# Patient Record
Sex: Female | Born: 1960 | Race: White | Hispanic: No | Marital: Married | Smoking: Never smoker
Health system: Southern US, Community
[De-identification: ages and names within clinical notes are randomized; demographics above are authoritative.]

## PROBLEM LIST (undated history)

## (undated) DIAGNOSIS — I10 Essential (primary) hypertension: Secondary | ICD-10-CM

## (undated) DIAGNOSIS — G4730 Sleep apnea, unspecified: Secondary | ICD-10-CM

## (undated) DIAGNOSIS — K219 Gastro-esophageal reflux disease without esophagitis: Secondary | ICD-10-CM

## (undated) DIAGNOSIS — R7301 Impaired fasting glucose: Secondary | ICD-10-CM

## (undated) DIAGNOSIS — E119 Type 2 diabetes mellitus without complications: Secondary | ICD-10-CM

## (undated) DIAGNOSIS — E282 Polycystic ovarian syndrome: Secondary | ICD-10-CM

## (undated) HISTORY — DX: Polycystic ovarian syndrome: E28.2

## (undated) HISTORY — DX: Impaired fasting glucose: R73.01

## (undated) HISTORY — DX: Essential (primary) hypertension: I10

---

## 2003-07-09 ENCOUNTER — Ambulatory Visit (HOSPITAL_COMMUNITY): Admission: RE | Admit: 2003-07-09 | Discharge: 2003-07-09 | Payer: Self-pay | Admitting: Family Medicine

## 2004-07-05 ENCOUNTER — Ambulatory Visit (HOSPITAL_COMMUNITY): Admission: RE | Admit: 2004-07-05 | Discharge: 2004-07-05 | Payer: Self-pay | Admitting: Family Medicine

## 2006-04-10 ENCOUNTER — Ambulatory Visit (HOSPITAL_COMMUNITY): Admission: RE | Admit: 2006-04-10 | Discharge: 2006-04-10 | Payer: Self-pay | Admitting: Family Medicine

## 2006-04-21 DIAGNOSIS — R7301 Impaired fasting glucose: Secondary | ICD-10-CM

## 2006-04-21 HISTORY — DX: Impaired fasting glucose: R73.01

## 2007-06-20 ENCOUNTER — Ambulatory Visit (HOSPITAL_COMMUNITY): Admission: RE | Admit: 2007-06-20 | Discharge: 2007-06-20 | Payer: Self-pay | Admitting: Family Medicine

## 2008-10-19 ENCOUNTER — Ambulatory Visit (HOSPITAL_COMMUNITY): Admission: RE | Admit: 2008-10-19 | Discharge: 2008-10-19 | Payer: Self-pay | Admitting: Family Medicine

## 2010-09-21 DIAGNOSIS — I10 Essential (primary) hypertension: Secondary | ICD-10-CM

## 2010-09-21 HISTORY — DX: Essential (primary) hypertension: I10

## 2010-09-30 ENCOUNTER — Other Ambulatory Visit (HOSPITAL_COMMUNITY): Payer: Self-pay | Admitting: Family Medicine

## 2010-09-30 DIAGNOSIS — Z139 Encounter for screening, unspecified: Secondary | ICD-10-CM

## 2010-10-04 ENCOUNTER — Other Ambulatory Visit (HOSPITAL_COMMUNITY): Payer: Self-pay | Admitting: Family Medicine

## 2010-10-04 DIAGNOSIS — R1011 Right upper quadrant pain: Secondary | ICD-10-CM

## 2010-10-07 ENCOUNTER — Ambulatory Visit (HOSPITAL_COMMUNITY)
Admission: RE | Admit: 2010-10-07 | Discharge: 2010-10-07 | Disposition: A | Discharge status: Home / Self Care | Payer: PRIVATE HEALTH INSURANCE | Source: Ambulatory Visit | Attending: Family Medicine | Admitting: Family Medicine

## 2010-10-07 DIAGNOSIS — Z139 Encounter for screening, unspecified: Secondary | ICD-10-CM

## 2010-10-07 DIAGNOSIS — Z1231 Encounter for screening mammogram for malignant neoplasm of breast: Secondary | ICD-10-CM | POA: Insufficient documentation

## 2010-10-10 ENCOUNTER — Other Ambulatory Visit (HOSPITAL_COMMUNITY): Payer: Self-pay

## 2010-10-11 ENCOUNTER — Ambulatory Visit (HOSPITAL_COMMUNITY)
Admission: RE | Admit: 2010-10-11 | Discharge: 2010-10-11 | Disposition: A | Discharge status: Home / Self Care | Payer: PRIVATE HEALTH INSURANCE | Source: Ambulatory Visit | Attending: Family Medicine | Admitting: Family Medicine

## 2010-10-11 ENCOUNTER — Other Ambulatory Visit (HOSPITAL_COMMUNITY): Payer: Self-pay

## 2010-10-11 DIAGNOSIS — R1011 Right upper quadrant pain: Secondary | ICD-10-CM

## 2010-10-11 DIAGNOSIS — R109 Unspecified abdominal pain: Secondary | ICD-10-CM | POA: Insufficient documentation

## 2010-10-11 DIAGNOSIS — K7689 Other specified diseases of liver: Secondary | ICD-10-CM | POA: Insufficient documentation

## 2012-10-21 ENCOUNTER — Other Ambulatory Visit (HOSPITAL_COMMUNITY): Payer: Self-pay | Admitting: Family Medicine

## 2012-10-21 DIAGNOSIS — Z139 Encounter for screening, unspecified: Secondary | ICD-10-CM

## 2012-10-24 ENCOUNTER — Ambulatory Visit (HOSPITAL_COMMUNITY)
Admission: RE | Admit: 2012-10-24 | Discharge: 2012-10-24 | Disposition: A | Discharge status: Home / Self Care | Payer: PRIVATE HEALTH INSURANCE | Source: Ambulatory Visit | Attending: Family Medicine | Admitting: Family Medicine

## 2012-10-24 DIAGNOSIS — Z139 Encounter for screening, unspecified: Secondary | ICD-10-CM

## 2012-10-24 DIAGNOSIS — Z1231 Encounter for screening mammogram for malignant neoplasm of breast: Secondary | ICD-10-CM | POA: Insufficient documentation

## 2012-10-26 ENCOUNTER — Encounter: Payer: Self-pay | Admitting: *Deleted

## 2012-10-29 ENCOUNTER — Other Ambulatory Visit: Payer: Self-pay | Admitting: Family Medicine

## 2012-10-29 DIAGNOSIS — R928 Other abnormal and inconclusive findings on diagnostic imaging of breast: Secondary | ICD-10-CM

## 2012-11-06 ENCOUNTER — Other Ambulatory Visit: Payer: Self-pay | Admitting: Family Medicine

## 2012-11-06 ENCOUNTER — Other Ambulatory Visit (HOSPITAL_COMMUNITY): Payer: Self-pay | Admitting: Family Medicine

## 2012-11-06 ENCOUNTER — Encounter (HOSPITAL_COMMUNITY): Payer: PRIVATE HEALTH INSURANCE

## 2012-11-06 ENCOUNTER — Ambulatory Visit (HOSPITAL_COMMUNITY)
Admission: RE | Admit: 2012-11-06 | Discharge: 2012-11-06 | Disposition: A | Discharge status: Home / Self Care | Payer: PRIVATE HEALTH INSURANCE | Source: Ambulatory Visit | Attending: Family Medicine | Admitting: Family Medicine

## 2012-11-06 DIAGNOSIS — R928 Other abnormal and inconclusive findings on diagnostic imaging of breast: Secondary | ICD-10-CM

## 2012-11-06 DIAGNOSIS — N6001 Solitary cyst of right breast: Secondary | ICD-10-CM

## 2012-11-06 DIAGNOSIS — N6009 Solitary cyst of unspecified breast: Secondary | ICD-10-CM | POA: Insufficient documentation

## 2012-11-11 ENCOUNTER — Other Ambulatory Visit: Payer: Self-pay | Admitting: Family Medicine

## 2012-11-11 ENCOUNTER — Ambulatory Visit
Admission: RE | Admit: 2012-11-11 | Discharge: 2012-11-11 | Disposition: A | Discharge status: Home / Self Care | Payer: PRIVATE HEALTH INSURANCE | Source: Ambulatory Visit | Attending: Family Medicine | Admitting: Family Medicine

## 2012-11-11 DIAGNOSIS — N6001 Solitary cyst of right breast: Secondary | ICD-10-CM

## 2012-12-04 ENCOUNTER — Telehealth: Payer: Self-pay | Admitting: Family Medicine

## 2012-12-04 NOTE — Telephone Encounter (Signed)
[employer] was approved by [employer] until 02/28/13

## 2013-02-12 ENCOUNTER — Other Ambulatory Visit: Payer: Self-pay | Admitting: Family Medicine

## 2013-05-09 ENCOUNTER — Other Ambulatory Visit: Payer: Self-pay | Admitting: Family Medicine

## 2013-05-09 DIAGNOSIS — N6310 Unspecified lump in the right breast, unspecified quadrant: Secondary | ICD-10-CM

## 2013-05-30 ENCOUNTER — Other Ambulatory Visit: Payer: PRIVATE HEALTH INSURANCE

## 2013-06-02 ENCOUNTER — Ambulatory Visit
Admission: RE | Admit: 2013-06-02 | Discharge: 2013-06-02 | Disposition: A | Discharge status: Home / Self Care | Payer: PRIVATE HEALTH INSURANCE | Source: Ambulatory Visit | Attending: Family Medicine | Admitting: Family Medicine

## 2013-06-02 DIAGNOSIS — N6310 Unspecified lump in the right breast, unspecified quadrant: Secondary | ICD-10-CM

## 2013-07-30 ENCOUNTER — Encounter: Payer: Self-pay | Admitting: Nurse Practitioner

## 2013-07-30 ENCOUNTER — Ambulatory Visit (INDEPENDENT_AMBULATORY_CARE_PROVIDER_SITE_OTHER): Payer: PRIVATE HEALTH INSURANCE | Admitting: Nurse Practitioner

## 2013-07-30 VITALS — BP 148/92 | Temp 98.5°F | Ht 68.0 in | Wt 256.4 lb

## 2013-07-30 DIAGNOSIS — G5690 Unspecified mononeuropathy of unspecified upper limb: Secondary | ICD-10-CM

## 2013-07-30 DIAGNOSIS — J310 Chronic rhinitis: Secondary | ICD-10-CM

## 2013-07-30 DIAGNOSIS — M67919 Unspecified disorder of synovium and tendon, unspecified shoulder: Secondary | ICD-10-CM

## 2013-07-30 DIAGNOSIS — M7582 Other shoulder lesions, left shoulder: Secondary | ICD-10-CM

## 2013-07-30 DIAGNOSIS — J329 Chronic sinusitis, unspecified: Secondary | ICD-10-CM

## 2013-07-30 DIAGNOSIS — I10 Essential (primary) hypertension: Secondary | ICD-10-CM

## 2013-07-30 DIAGNOSIS — M778 Other enthesopathies, not elsewhere classified: Secondary | ICD-10-CM

## 2013-07-30 MED ORDER — LOSARTAN POTASSIUM 50 MG PO TABS: ORAL_TABLET | ORAL | Status: DC

## 2013-07-30 MED ORDER — AMOXICILLIN-POT CLAVULANATE 875-125 MG PO TABS: 1.0000 | ORAL_TABLET | Freq: Two times a day (BID) | ORAL | Status: DC

## 2013-07-31 ENCOUNTER — Encounter: Payer: Self-pay | Admitting: Nurse Practitioner

## 2013-07-31 DIAGNOSIS — I10 Essential (primary) hypertension: Secondary | ICD-10-CM | POA: Insufficient documentation

## 2013-07-31 DIAGNOSIS — G5690 Unspecified mononeuropathy of unspecified upper limb: Secondary | ICD-10-CM | POA: Insufficient documentation

## 2013-07-31 NOTE — Assessment & Plan Note (Signed)
OTC meds as directed for congestion. Given prescription for wrist brace, recommend she wear wrist brace on both At nighttime. Anti-inflammatories as directed. Defers referral to hand specialist at this point. Given written and verbal information on shoulder tendinitis. Call back if symptoms worsen or persist. 

## 2013-07-31 NOTE — Assessment & Plan Note (Signed)
OTC meds as directed for congestion. Given prescription for wrist brace, recommend she wear wrist brace on both At nighttime. Anti-inflammatories as directed. Defers referral to hand specialist at this point. Given written and verbal information on shoulder tendinitis. Call back if symptoms worsen or persist.

## 2013-07-31 NOTE — Progress Notes (Signed)
Subjective:  Presents for several issues. Has had sinus symptoms for the past 10 days. Had 3 doses of amoxicillin which she took. No headache. Sore throat has improved. Slight cough. Some ear pain. Sinus pressure. Has had occasional tingling and pain going into the hands and fingers at times. Her job requires working on a Animator. Did some raking around Thanksgiving which caused a flareup of her symptoms. Now more constant. Involves all the fingers but mainly towards the middle fingers more on the left hand. Has worn a wrist brace on her right hand. Occasional feeling of electric shock going down the hands. Sensation of them falling asleep at nighttime. Mild left shoulder pain, no history of injury. No neck pain. No elbow pain. Taking her blood pressure medication. No chest pain/ischemic type pain or shortness of breath. No edema. Is due for lab work, states her job provides it early in the year. Patient to send [place] a copy.  Objective:   BP 148/92  Temp(Src) 98.5 F (36.9 C) (Oral)  Ht 5\' 8"  (1.727 m)  Wt 256 lb 6.4 oz (116.302 kg)  BMI 38.99 kg/m2 NAD. Alert, oriented. TMs clear effusion, no erythema. Pharynx injected with PND noted. Neck supple with mild soft nontender adenopathy. Lungs clear. Heart regular rate rhythm. Lower extremities no edema. Good ROM of the neck without tenderness. Good ROM of right shoulder without tenderness. Tenderness noted around the anterior and mid shoulder joint with pain noted with full rotation of the shoulder above shoulder height. Can perform active ROM with minimal tenderness. Hand and arm strength 5+ bilateral. Sensation grossly intact. Phalen's produces symptoms in both hands, Tinel's negative. Strong radial pulses bilaterally.  Assessment:Essential hypertension, benign  Rhinosinusitis  Neuropathy of hand, unspecified laterality  Shoulder tendinitis, left  Plan: Meds ordered this encounter  Medications  . losartan (COZAAR) 50 MG tablet    Sig: TAKE 1  TABLET BY MOUTH EVERY MORNING    Dispense:  30 tablet    Refill:  5    Order Specific Question:  Supervising Provider    Answer:  Merlyn Albert [[id number]]  . amoxicillin-clavulanate (AUGMENTIN) 875-125 MG per tablet    Sig: Take 1 tablet by mouth 2 (two) times daily.    Dispense:  20 tablet    Refill:  0    Order Specific Question:  Supervising Provider    Answer:  Merlyn Albert [[id number]]   OTC meds as directed for congestion. Given prescription for wrist brace, recommend she wear wrist brace on both At nighttime. Anti-inflammatories as directed. Defers referral to hand specialist at this point. Given written and verbal information on shoulder tendinitis. Call back if symptoms worsen or persist.

## 2013-12-03 ENCOUNTER — Other Ambulatory Visit: Payer: Self-pay | Admitting: Family Medicine

## 2013-12-03 DIAGNOSIS — Z09 Encounter for follow-up examination after completed treatment for conditions other than malignant neoplasm: Secondary | ICD-10-CM

## 2013-12-04 ENCOUNTER — Encounter: Payer: Self-pay | Admitting: Nurse Practitioner

## 2013-12-04 ENCOUNTER — Ambulatory Visit (INDEPENDENT_AMBULATORY_CARE_PROVIDER_SITE_OTHER): Payer: PRIVATE HEALTH INSURANCE | Admitting: Nurse Practitioner

## 2013-12-04 VITALS — BP 138/94 | Ht 68.0 in | Wt 260.0 lb

## 2013-12-04 DIAGNOSIS — N912 Amenorrhea, unspecified: Secondary | ICD-10-CM

## 2013-12-04 DIAGNOSIS — Z23 Encounter for immunization: Secondary | ICD-10-CM

## 2013-12-04 DIAGNOSIS — Z0000 Encounter for general adult medical examination without abnormal findings: Secondary | ICD-10-CM

## 2013-12-04 DIAGNOSIS — E119 Type 2 diabetes mellitus without complications: Secondary | ICD-10-CM

## 2013-12-04 DIAGNOSIS — Z124 Encounter for screening for malignant neoplasm of cervix: Secondary | ICD-10-CM

## 2013-12-04 DIAGNOSIS — Z09 Encounter for follow-up examination after completed treatment for conditions other than malignant neoplasm: Secondary | ICD-10-CM

## 2013-12-04 MED ORDER — METFORMIN HCL 500 MG PO TABS: 500.0000 mg | ORAL_TABLET | Freq: Two times a day (BID) | ORAL | Status: DC

## 2013-12-04 MED ORDER — ALBIGLUTIDE 50 MG ~~LOC~~ PEN: PEN_INJECTOR | SUBCUTANEOUS | Status: DC

## 2013-12-05 ENCOUNTER — Encounter: Payer: Self-pay | Admitting: Nurse Practitioner

## 2013-12-05 DIAGNOSIS — E119 Type 2 diabetes mellitus without complications: Secondary | ICD-10-CM | POA: Insufficient documentation

## 2013-12-05 LAB — PAP IG W/ RFLX HPV ASCU

## 2013-12-05 NOTE — Progress Notes (Signed)
Subjective:    Patient ID: Susan Becker, female    DOB: 1961-04-08, 53 y.o.   MRN: [id number]  HPI Presents for her wellness checkup. Has brought labs from work. Increased stress and weight gain lately. Working extra hours. No exercise. Has mammogram scheduled next week. Has been off metformin. No cycle since November. Married, same partner. No pelvic pain or discharge. Regular dental and vision exams.     Review of Systems  Constitutional: Positive for fatigue. Negative for activity change and appetite change.  HENT: Negative for dental problem, ear pain, sinus pressure and sore throat.   Respiratory: Negative for cough, chest tightness, shortness of breath and wheezing.   Cardiovascular: Negative for chest pain and leg swelling.  Gastrointestinal: Negative for nausea, vomiting, abdominal pain, diarrhea, constipation and blood in stool.  Genitourinary: Negative for dysuria, urgency, frequency, vaginal bleeding, vaginal discharge, enuresis, difficulty urinating, menstrual problem and pelvic pain.       Objective:   Physical Exam  Vitals reviewed. Constitutional: She is oriented to person, place, and time. She appears well-developed. No distress.  HENT:  Right Ear: External ear normal.  Left Ear: External ear normal.  Mouth/Throat: Oropharynx is clear and moist.  Neck: Normal range of motion. Neck supple. No tracheal deviation present. No thyromegaly present.  Cardiovascular: Normal rate, regular rhythm and normal heart sounds.  Exam reveals no gallop.   No murmur heard. Pulmonary/Chest: Effort normal and breath sounds normal.  Abdominal: Soft. She exhibits no distension. There is no tenderness.  Rectal exam: no masses; no stool for hemoccult.   Genitourinary: Vagina normal and uterus normal. No vaginal discharge found.  External GU: no lesions or rash. Vagina no discharge. Cervix normal in appearance. No CMT. Bimanual exam normal but limited due to abd girth.  Musculoskeletal:  She exhibits no edema.  Lymphadenopathy:    She has no cervical adenopathy.  Neurological: She is alert and oriented to person, place, and time.  Skin: Skin is warm and dry. No rash noted.  Psychiatric: She has a normal mood and affect. Her behavior is normal.  Breast exam: no dominant masses; axillae no adenopathy. See diabetic foot exam.       Assessment & Plan:  Routine general medical examination at a health care facility  Type 2 diabetes mellitus new diagnosis - Plan: Microalbumin, urine, Hemoglobin A1c, Microalbumin, urine, Hemoglobin A1c  Amenorrhea - Plan: FSH, LH, Estradiol, Progesterone, Progesterone, FSH, LH, Estradiol  Follow-up exam - Plan: MM Digital Diagnostic Bilat  Screening for cervical cancer - Plan: Pap IG w/ reflex to HPV when ASC-U  Need for prophylactic vaccination with combined diphtheria-tetanus-pertussis (DTP) vaccine - Plan: Tdap vaccine greater than or equal to 7yo IM  Meds ordered this encounter  Medications  . metFORMIN (GLUCOPHAGE) 500 MG tablet    Sig: Take 1 tablet (500 mg total) by mouth 2 (two) times daily with a meal.    Dispense:  60 tablet    Refill:  5    Order Specific Question:  Supervising Provider    Answer:  Mikey Kirschner [[id number]]  . Albiglutide (TANZEUM) 50 MG PEN    Sig: Inject SQ once a week as directed    Dispense:  4 each    Refill:  5    Order Specific Question:  Supervising Provider    Answer:  Mikey Kirschner [[id number]]   Start with Tanzeum 30 mg first dose. Discussed weight loss, regular exercise and stress reduction. Office visit with labs in  3 months.

## 2013-12-10 ENCOUNTER — Other Ambulatory Visit: Payer: Self-pay | Admitting: Family Medicine

## 2013-12-10 ENCOUNTER — Ambulatory Visit (HOSPITAL_COMMUNITY)
Admission: RE | Admit: 2013-12-10 | Discharge: 2013-12-10 | Disposition: A | Discharge status: Home / Self Care | Payer: PRIVATE HEALTH INSURANCE | Source: Ambulatory Visit | Attending: Family Medicine | Admitting: Family Medicine

## 2013-12-10 ENCOUNTER — Encounter (HOSPITAL_COMMUNITY): Payer: PRIVATE HEALTH INSURANCE

## 2013-12-10 DIAGNOSIS — Z09 Encounter for follow-up examination after completed treatment for conditions other than malignant neoplasm: Secondary | ICD-10-CM

## 2013-12-10 DIAGNOSIS — N63 Unspecified lump in unspecified breast: Secondary | ICD-10-CM

## 2013-12-26 ENCOUNTER — Other Ambulatory Visit: Payer: Self-pay

## 2013-12-26 ENCOUNTER — Other Ambulatory Visit: Payer: Self-pay | Admitting: Family Medicine

## 2013-12-26 DIAGNOSIS — N63 Unspecified lump in unspecified breast: Secondary | ICD-10-CM

## 2013-12-30 ENCOUNTER — Other Ambulatory Visit: Payer: Self-pay | Admitting: Family Medicine

## 2013-12-30 ENCOUNTER — Ambulatory Visit
Admission: RE | Admit: 2013-12-30 | Discharge: 2013-12-30 | Disposition: A | Discharge status: Home / Self Care | Payer: PRIVATE HEALTH INSURANCE | Source: Ambulatory Visit | Attending: Family Medicine | Admitting: Family Medicine

## 2013-12-30 DIAGNOSIS — N63 Unspecified lump in unspecified breast: Secondary | ICD-10-CM

## 2014-01-28 ENCOUNTER — Encounter: Payer: Self-pay | Admitting: Nurse Practitioner

## 2014-01-28 ENCOUNTER — Ambulatory Visit (INDEPENDENT_AMBULATORY_CARE_PROVIDER_SITE_OTHER): Payer: PRIVATE HEALTH INSURANCE | Admitting: Nurse Practitioner

## 2014-01-28 VITALS — BP 130/90 | Temp 98.3°F | Ht 68.0 in | Wt 252.0 lb

## 2014-01-28 DIAGNOSIS — K219 Gastro-esophageal reflux disease without esophagitis: Secondary | ICD-10-CM

## 2014-01-28 DIAGNOSIS — J029 Acute pharyngitis, unspecified: Secondary | ICD-10-CM

## 2014-01-28 MED ORDER — CEFDINIR 300 MG PO CAPS: 300.0000 mg | ORAL_CAPSULE | Freq: Two times a day (BID) | ORAL | Status: DC

## 2014-01-28 MED ORDER — RANITIDINE HCL 300 MG PO TABS: 300.0000 mg | ORAL_TABLET | Freq: Every day | ORAL | Status: DC

## 2014-01-29 ENCOUNTER — Encounter: Payer: Self-pay | Admitting: Nurse Practitioner

## 2014-01-29 LAB — STREP A DNA PROBE: GASP: NEGATIVE

## 2014-01-29 NOTE — Progress Notes (Signed)
Subjective:  Presents for c/o sore throat that began yesterday. Raw, burning sensation in throat. No fever. No nausea or vomiting. Rare cough. PND. Has slight blood in mucus x 1 this am. No reflux, nausea, vomiting or abd pain. Can swallow saliva without difficulty. Non smoker. Moderate caffeine intake. No NSAID use. No changes in her diet. Under stress.  Objective:   BP 130/90  Temp(Src) 98.3 F (36.8 C)  Ht 5\' 8"  (1.727 m)  Wt 252 lb (114.306 kg)  BMI 38.33 kg/m2 NAD. Alert, oriented. TMs minimal clear effusion. Pharynx: no erythema. Cloudy PND noted. Neck supple with mild tender anterior adenopathy. Lungs clear. Heart RRR. abd soft, non distended, moderate epigastric area tenderness.  Assessment: Acute pharyngitis - Plan: POCT rapid strep A, Strep A DNA probe  GERD (gastroesophageal reflux disease)/gastritis  Plan:  Meds ordered this encounter  Medications  . ranitidine (ZANTAC) 300 MG tablet    Sig: Take 1 tablet (300 mg total) by mouth at bedtime.    Dispense:  30 tablet    Refill:  2    Order Specific Question:  Supervising Provider    Answer:  Mikey Kirschner [[id number]]  . cefdinir (OMNICEF) 300 MG capsule    Sig: Take 1 capsule (300 mg total) by mouth 2 (two) times daily.    Dispense:  20 capsule    Refill:  0    Order Specific Question:  Supervising Provider    Answer:  Mikey Kirschner [[id number]]   Discussed reflux. Take Zantac until symptoms including abd pain have resolved then prn. Call back if persists. Throat culture pending.

## 2014-02-24 ENCOUNTER — Telehealth: Payer: Self-pay | Admitting: Family Medicine

## 2014-02-24 NOTE — Telephone Encounter (Signed)
Pt has had her cycle, would like you to cancel the part of her lab orders that you stated she wouldn't need if she had a cycle before getting labs done, please call pt when done so she can go get her labs done before her appt here with you 03/06/14

## 2014-02-25 NOTE — Telephone Encounter (Signed)
Hormone labs cancelled. Patient notified.

## 2014-02-25 NOTE — Telephone Encounter (Signed)
Nurses please cancel: estrogen, FSH, LH and progesterone. She will still need other tests for diabetes. Thanks.

## 2014-03-02 LAB — HEMOGLOBIN A1C
Hgb A1c MFr Bld: 5.9 % — ABNORMAL HIGH (ref <5.7)
Mean Plasma Glucose: 123 mg/dL — ABNORMAL HIGH (ref <117)

## 2014-03-03 LAB — MICROALBUMIN, URINE: Microalb, Ur: 1.24 mg/dL (ref 0.00–1.89)

## 2014-03-06 ENCOUNTER — Encounter: Payer: Self-pay | Admitting: Nurse Practitioner

## 2014-03-06 ENCOUNTER — Ambulatory Visit (INDEPENDENT_AMBULATORY_CARE_PROVIDER_SITE_OTHER): Payer: PRIVATE HEALTH INSURANCE | Admitting: Nurse Practitioner

## 2014-03-06 VITALS — BP 142/92 | Ht 68.0 in | Wt 249.1 lb

## 2014-03-06 DIAGNOSIS — E119 Type 2 diabetes mellitus without complications: Secondary | ICD-10-CM

## 2014-03-06 MED ORDER — PHENTERMINE HCL 37.5 MG PO TABS: 37.5000 mg | ORAL_TABLET | Freq: Every day | ORAL | Status: DC

## 2014-03-07 ENCOUNTER — Encounter: Payer: Self-pay | Admitting: Nurse Practitioner

## 2014-03-07 NOTE — Progress Notes (Signed)
Subjective:  Presents for routine followup. Limited activity was doing well with her diet. No chest pain/ischemic type pain or shortness of breath. Would like to restart phentermine, has taken it in the past without difficulty.  Objective:   BP 142/92  Ht 5\' 8"  (1.727 m)  Wt 249 lb 2 oz (113.002 kg)  BMI 37.89 kg/m2 NAD. Alert, oriented. Lungs clear. Heart regular rate rhythm. See diabetic foot exam. 03/03/2014 hemoglobin A1c 5.9.  Assessment:  Problem List Items Addressed This Visit     Endocrine   Type 2 diabetes mellitus - Primary    Other Visit Diagnoses   Morbid obesity        Relevant Medications       phentermine (ADIPEX-P) 37.5 MG tablet      Encouraged continued weight loss. Goals set for 12 pounds of weight loss over the next 3 months and 20 minutes of activity 3 times per week. Check BP outside the office and if 10 points higher than current results, patient to stop phentermine. Return in about 3 months (around 06/06/2014).

## 2014-03-25 ENCOUNTER — Other Ambulatory Visit: Payer: Self-pay | Admitting: Nurse Practitioner

## 2014-04-28 ENCOUNTER — Encounter: Payer: Self-pay | Admitting: Nurse Practitioner

## 2014-04-28 ENCOUNTER — Ambulatory Visit (INDEPENDENT_AMBULATORY_CARE_PROVIDER_SITE_OTHER): Payer: PRIVATE HEALTH INSURANCE | Admitting: Nurse Practitioner

## 2014-04-28 VITALS — BP 142/90 | Ht 68.0 in | Wt 243.0 lb

## 2014-04-28 DIAGNOSIS — J0100 Acute maxillary sinusitis, unspecified: Secondary | ICD-10-CM

## 2014-04-28 DIAGNOSIS — M7711 Lateral epicondylitis, right elbow: Secondary | ICD-10-CM

## 2014-04-28 DIAGNOSIS — M7710 Lateral epicondylitis, unspecified elbow: Secondary | ICD-10-CM

## 2014-04-28 MED ORDER — AMOXICILLIN-POT CLAVULANATE 875-125 MG PO TABS: 1.0000 | ORAL_TABLET | Freq: Two times a day (BID) | ORAL | Status: DC

## 2014-04-28 MED ORDER — METHYLPREDNISOLONE ACETATE 40 MG/ML IJ SUSP
40.0000 mg | Freq: Once | INTRAMUSCULAR | Status: AC
  Administered 2014-04-28: 40 mg via INTRAMUSCULAR

## 2014-04-28 MED ORDER — FLUTICASONE PROPIONATE 50 MCG/ACT NA SUSP: 2.0000 | Freq: Every day | NASAL | Status: DC

## 2014-04-28 MED ORDER — FLUCONAZOLE 150 MG PO TABS: ORAL_TABLET | ORAL | Status: DC

## 2014-04-28 NOTE — Patient Instructions (Signed)

## 2014-04-29 ENCOUNTER — Encounter: Payer: Self-pay | Admitting: Nurse Practitioner

## 2014-04-29 NOTE — Progress Notes (Signed)
Subjective:  Presents complaints of sinus symptoms over the past 2-1/2-3 weeks. No fever. Maxillary area sinus headache radiating into the teeth at times. Left ear pain. No sore throat. No cough or wheezing. Also complaints of pain at the lateral epicondyle that is been there off and on for several months. No specific history of injury. Does a lot of computer work. Worse with certain movements. No numbness or weakness.  Objective:   BP 142/90  Ht 5\' 8"  (1.727 m)  Wt 243 lb (110.224 kg)  BMI 36.96 kg/m2 NAD. Alert, oriented. TMs clear effusion, no erythema. Pharynx mildly injected. Neck supple with mild soft anterior adenopathy. Lungs clear. Heart regular rate rhythm. Distinct tenderness noted at the right lateral epicondyle. Hand strength 5+ bilateral. Sensation grossly intact.  Assessment: Acute maxillary sinusitis, recurrence not specified - Plan: methylPREDNISolone acetate (DEPO-MEDROL) injection 40 mg  Lateral epicondylitis, right  Plan:  Meds ordered this encounter  Medications  . fluticasone (FLONASE) 50 MCG/ACT nasal spray    Sig: Place 2 sprays into both nostrils daily.    Dispense:  16 g    Refill:  5    Order Specific Question:  Supervising Provider    Answer:  Mikey Kirschner [[id number]]  . amoxicillin-clavulanate (AUGMENTIN) 875-125 MG per tablet    Sig: Take 1 tablet by mouth 2 (two) times daily.    Dispense:  20 tablet    Refill:  0    Order Specific Question:  Supervising Provider    Answer:  Mikey Kirschner [[id number]]  . fluconazole (DIFLUCAN) 150 MG tablet    Sig: One po qd prn yeast infection; may repeat in 3-4 days if needed    Dispense:  2 tablet    Refill:  0    Order Specific Question:  Supervising Provider    Answer:  Mikey Kirschner [[id number]]  . methylPREDNISolone acetate (DEPO-MEDROL) injection 40 mg    Sig:    OTC antihistamines as directed. OTC anti-inflammatories as directed. Given written and verbal information on lateral epicondylitis, recommend arm band.  Call back if any symptoms worsen or persist.

## 2014-05-27 ENCOUNTER — Ambulatory Visit (INDEPENDENT_AMBULATORY_CARE_PROVIDER_SITE_OTHER): Payer: PRIVATE HEALTH INSURANCE | Admitting: Nurse Practitioner

## 2014-05-27 ENCOUNTER — Encounter: Payer: Self-pay | Admitting: Nurse Practitioner

## 2014-05-27 VITALS — BP 134/90 | Ht 66.0 in | Wt 244.0 lb

## 2014-05-27 DIAGNOSIS — J300 Vasomotor rhinitis: Secondary | ICD-10-CM

## 2014-05-27 DIAGNOSIS — M6248 Contracture of muscle, other site: Secondary | ICD-10-CM

## 2014-05-27 DIAGNOSIS — M62838 Other muscle spasm: Secondary | ICD-10-CM

## 2014-05-27 MED ORDER — CHLORZOXAZONE 500 MG PO TABS: ORAL_TABLET | ORAL | Status: DC

## 2014-05-27 NOTE — Patient Instructions (Signed)
Ice or Education officer, museum Relief TENS unit Massage therapy

## 2014-05-28 ENCOUNTER — Other Ambulatory Visit: Payer: Self-pay | Admitting: Nurse Practitioner

## 2014-05-31 ENCOUNTER — Encounter: Payer: Self-pay | Admitting: Nurse Practitioner

## 2014-05-31 NOTE — Progress Notes (Signed)
Subjective:  Presents complaints of pain in the right mid shoulder radiating into the neck area for the past 2 weeks. Seems to radiate in the right side of the neck into the right ear. Right lateral elbow pain much improved from previous visit. No numbness or weakness of the arms. No specific history of injury. Also continues to have some mild head congestion and ear pressure. No fever. Minimal cough. Clear drainage. Treated for maxillary sinusitis on 9/8.  Objective:   BP 134/90  Ht 5\' 6"  (1.676 m)  Wt 244 lb (110.678 kg)  BMI 39.40 kg/m2 NAD. Alert, oriented. TMs mild clear effusion, no erythema. Pharynx clear. Neck supple with mild soft anterior adenopathy. Lungs clear. Heart regular rate rhythm. Distinct tight tender muscles noted along the trapezius into the cervical and right lateral neck area. Radiates into the upper rhomboids. Tenderness near the a.c. joint right shoulder. Can perform active and passive ROM of the right shoulder with tenderness produced in the trapezius. Hand and arm strength 5+ bilateral. Sensation grossly intact.  Assessment: Muscle spasms of head and/or neck  Vasomotor rhinitis  Plan:  Meds ordered this encounter  Medications  . Meloxicam (MOBIC PO)    Sig: Take by mouth.  . chlorzoxazone (PARAFON) 500 MG tablet    Sig: One po qhs prn muscle spasms    Dispense:  30 tablet    Refill:  0    Order Specific Question:  Supervising Provider    Answer:  Mikey Kirschner [[id number]]   ice or heat applications. OTC TENS unit. Massage therapy. Call back in 2 weeks if symptoms persist, sooner if worse. OTC meds as directed for head congestion.

## 2014-06-03 ENCOUNTER — Encounter: Payer: Self-pay | Admitting: Nurse Practitioner

## 2014-06-03 ENCOUNTER — Ambulatory Visit (INDEPENDENT_AMBULATORY_CARE_PROVIDER_SITE_OTHER): Payer: PRIVATE HEALTH INSURANCE | Admitting: Nurse Practitioner

## 2014-06-03 VITALS — BP 130/82 | Ht 66.0 in | Wt 245.0 lb

## 2014-06-03 DIAGNOSIS — I10 Essential (primary) hypertension: Secondary | ICD-10-CM

## 2014-06-03 DIAGNOSIS — E119 Type 2 diabetes mellitus without complications: Secondary | ICD-10-CM | POA: Diagnosis not present

## 2014-06-03 MED ORDER — PHENTERMINE HCL 37.5 MG PO TABS: 37.5000 mg | ORAL_TABLET | Freq: Every day | ORAL | Status: DC

## 2014-06-03 NOTE — Progress Notes (Addendum)
Subjective:  Presents for follow up. Doing well with activity; 20-30 minutes 3-7 days of the week. Limits diet soda to 2 per day; has increased water intake. Problem is with her diet; busy with family and work; eats "on the run".   Objective:   BP 130/82  Ht 5\' 6"  (1.676 m)  Wt 245 lb (111.131 kg)  BMI 39.56 kg/m2 NAD. Alert, oriented. Lungs clear. Heart RRR.  Assessment:  Problem List Items Addressed This Visit      Cardiovascular and Mediastinum   Essential hypertension, benign - Primary     Endocrine   Type 2 diabetes mellitus     Other   Morbid obesity      Plan:  Meds ordered this encounter  Medications  . phentermine (ADIPEX-P) 37.5 MG tablet    Sig: Take 1 tablet (37.5 mg total) by mouth daily before breakfast.    Dispense:  30 tablet    Refill:  2    Order Specific Question:  Supervising Provider    Answer:  Mikey Kirschner [[id number]]   Continue activity. Lengthy discussion about diet. Limit junk food. Recommend 3 meals per day plus 2 healthy snacks. Return in about 4 months (around 10/04/2014). Labs at that time. Receives flu vaccine at work.

## 2014-08-27 ENCOUNTER — Telehealth: Payer: Self-pay | Admitting: Nurse Practitioner

## 2014-08-27 NOTE — Telephone Encounter (Signed)
Patient notified and verbalized understanding. Will call back if she has any problems.

## 2014-08-27 NOTE — Telephone Encounter (Signed)
Just spoke with insurance. Will change diagnosis and send through again. Let us know if further problems.

## 2014-08-27 NOTE — Telephone Encounter (Signed)
Sorry this happened. Usually morbid obesity is a secondary diagnosis. I will talk to our insurance people to see if we can change.

## 2014-08-27 NOTE — Telephone Encounter (Signed)
Patient called in concern with her bill that she received for DOS:06/03/14.  She was diagnosed with morbid obesity.  She received her phentermine this day.  Her insurance is not wanting to cover the primary diagnosis of "morbid obesity".  In the past they have covered her diabetes as a primary diagnosis though.  She is wanting to know if Hoyle Sauer can go in and make an addendum to this so that her insurance will pay this?  Also, she wants to make sure that future visits for her phentermine will not be diagnosed for "morbid obesity" because if so, then she will stop taking it.  Please advise.

## 2014-09-25 ENCOUNTER — Other Ambulatory Visit: Payer: Self-pay | Admitting: Nurse Practitioner

## 2014-10-06 ENCOUNTER — Telehealth: Payer: Self-pay | Admitting: Nurse Practitioner

## 2014-10-06 NOTE — Telephone Encounter (Signed)
Pt is wanting to know if she needs any bw orders for her up coming appt   Please call when sent

## 2014-10-07 ENCOUNTER — Other Ambulatory Visit: Payer: Self-pay | Admitting: Nurse Practitioner

## 2014-10-07 DIAGNOSIS — R5383 Other fatigue: Secondary | ICD-10-CM

## 2014-10-07 DIAGNOSIS — I10 Essential (primary) hypertension: Secondary | ICD-10-CM

## 2014-10-07 DIAGNOSIS — Z13220 Encounter for screening for lipoid disorders: Secondary | ICD-10-CM

## 2014-10-07 DIAGNOSIS — E119 Type 2 diabetes mellitus without complications: Secondary | ICD-10-CM

## 2014-10-07 NOTE — Telephone Encounter (Signed)
Done. Ordered through [place] (I think!); also I don't know if she is having any labs through work. If she is, all we will need is HgbA1C here in the office.

## 2014-10-08 NOTE — Telephone Encounter (Signed)
Pt notified that we would like for her to get her BW done before her appt tomorrow. Her work did not send her for BW.

## 2014-10-09 ENCOUNTER — Encounter: Payer: Self-pay | Admitting: Nurse Practitioner

## 2014-10-09 ENCOUNTER — Ambulatory Visit (INDEPENDENT_AMBULATORY_CARE_PROVIDER_SITE_OTHER): Payer: PRIVATE HEALTH INSURANCE | Admitting: Nurse Practitioner

## 2014-10-09 VITALS — BP 152/94 | Ht 66.0 in | Wt 243.0 lb

## 2014-10-09 DIAGNOSIS — E119 Type 2 diabetes mellitus without complications: Secondary | ICD-10-CM

## 2014-10-09 DIAGNOSIS — F419 Anxiety disorder, unspecified: Secondary | ICD-10-CM

## 2014-10-09 DIAGNOSIS — F430 Acute stress reaction: Secondary | ICD-10-CM

## 2014-10-09 DIAGNOSIS — I10 Essential (primary) hypertension: Secondary | ICD-10-CM

## 2014-10-09 DIAGNOSIS — F411 Generalized anxiety disorder: Secondary | ICD-10-CM

## 2014-10-09 MED ORDER — LOSARTAN POTASSIUM 50 MG PO TABS: 50.0000 mg | ORAL_TABLET | Freq: Every morning | ORAL | Status: DC

## 2014-10-09 MED ORDER — METFORMIN HCL 500 MG PO TABS: ORAL_TABLET | ORAL | Status: DC

## 2014-10-09 MED ORDER — ESCITALOPRAM OXALATE 10 MG PO TABS: 10.0000 mg | ORAL_TABLET | Freq: Every day | ORAL | Status: DC

## 2014-10-10 LAB — BASIC METABOLIC PANEL
BUN: 11 mg/dL (ref 6–23)
CALCIUM: 9.6 mg/dL (ref 8.4–10.5)
CO2: 28 meq/L (ref 19–32)
Chloride: 101 mEq/L (ref 96–112)
Creat: 0.57 mg/dL (ref 0.50–1.10)
Glucose, Bld: 97 mg/dL (ref 70–99)
POTASSIUM: 3.8 meq/L (ref 3.5–5.3)
Sodium: 137 mEq/L (ref 135–145)

## 2014-10-10 LAB — HEMOGLOBIN A1C
Hgb A1c MFr Bld: 6.2 % — ABNORMAL HIGH (ref <5.7)
Mean Plasma Glucose: 131 mg/dL — ABNORMAL HIGH (ref <117)

## 2014-10-10 LAB — HEPATIC FUNCTION PANEL
ALBUMIN: 4.3 g/dL (ref 3.5–5.2)
ALT: 16 U/L (ref 0–35)
AST: 16 U/L (ref 0–37)
Alkaline Phosphatase: 75 U/L (ref 39–117)
Bilirubin, Direct: 0.1 mg/dL (ref 0.0–0.3)
Indirect Bilirubin: 0.3 mg/dL (ref 0.2–1.2)
Total Bilirubin: 0.4 mg/dL (ref 0.2–1.2)
Total Protein: 7.7 g/dL (ref 6.0–8.3)

## 2014-10-10 LAB — LIPID PANEL
CHOL/HDL RATIO: 3.9 ratio
CHOLESTEROL: 207 mg/dL — AB (ref 0–200)
HDL: 53 mg/dL (ref >39)
LDL CALC: 120 mg/dL — AB (ref 0–99)
TRIGLYCERIDES: 171 mg/dL — AB (ref <150)
VLDL: 34 mg/dL (ref 0–40)

## 2014-10-10 LAB — VITAMIN D 25 HYDROXY (VIT D DEFICIENCY, FRACTURES): VIT D 25 HYDROXY: 19 ng/mL — AB (ref 30–100)

## 2014-10-13 ENCOUNTER — Encounter: Payer: Self-pay | Admitting: Nurse Practitioner

## 2014-10-13 ENCOUNTER — Other Ambulatory Visit: Payer: Self-pay | Admitting: Nurse Practitioner

## 2014-10-13 DIAGNOSIS — E559 Vitamin D deficiency, unspecified: Secondary | ICD-10-CM | POA: Insufficient documentation

## 2014-10-13 MED ORDER — VITAMIN D (ERGOCALCIFEROL) 1.25 MG (50000 UNIT) PO CAPS: 50000.0000 [IU] | ORAL_CAPSULE | ORAL | Status: DC

## 2014-10-13 NOTE — Progress Notes (Signed)
Subjective:   Presents for routine follow-up. Has not done well with her diet. Limited exercise. Has been under tremendous stress due to her job an other family issues.  No chest pain/schemic type pain or shortness of breath. Had her lab work done this [employer]. Using tanzeum without difficulty.  Objective:   BP 152/94 mmHg  Ht 5\' 6"  (1.676 m)  Wt 243 lb (110.224 kg)  BMI 39.24 kg/m2  NAD.  Alert, oriented. Lungs clear. Heart regular rate rhythm. Weight stable.  Assessment:  Problem List Items Addressed This Visit      Cardiovascular and Mediastinum   Essential hypertension, benign - Primary   Relevant Medications   losartan (COZAAR) tablet     Endocrine   Type 2 diabetes mellitus   Relevant Medications   losartan (COZAAR) tablet   metFORMIN (GLUCOPHAGE) tablet     Other   Morbid obesity   Relevant Medications   metFORMIN (GLUCOPHAGE) tablet    Other Visit Diagnoses    Anxiety as acute reaction to exceptional stress        Relevant Medications    escitalopram (LEXAPRO) tablet      Plan:  Meds ordered this encounter  Medications  . losartan (COZAAR) 50 MG tablet    Sig: Take 1 tablet (50 mg total) by mouth every morning.    Dispense:  30 tablet    Refill:  5    Generic For:*COZAAR    50MG     Order Specific Question:  Supervising Provider    Answer:  Mikey Kirschner [[id number]]  . metFORMIN (GLUCOPHAGE) 500 MG tablet    Sig: TAKE 1 TABLET BY MOUTH 2 TIMES DAILY WITH A MEAL.    Dispense:  60 tablet    Refill:  5    Generic [id number]  500MG   09/25/2014 7:31:37 AM    Order Specific Question:  Supervising Provider    Answer:  Mikey Kirschner [[id number]]  . escitalopram (LEXAPRO) 10 MG tablet    Sig: Take 1 tablet (10 mg total) by mouth daily.    Dispense:  30 tablet    Refill:  0    Order Specific Question:  Supervising Provider    Answer:  Mikey Kirschner [[id number]]   Recommend regular exercise, stress reduction and healthy diet.  Return in about 1 month (around  11/07/2014). Call back sooner if any problems with Lexapro.

## 2014-10-14 ENCOUNTER — Other Ambulatory Visit: Payer: Self-pay | Admitting: Nurse Practitioner

## 2014-10-14 ENCOUNTER — Encounter (INDEPENDENT_AMBULATORY_CARE_PROVIDER_SITE_OTHER): Payer: Self-pay | Admitting: *Deleted

## 2014-10-14 ENCOUNTER — Other Ambulatory Visit (INDEPENDENT_AMBULATORY_CARE_PROVIDER_SITE_OTHER): Payer: Self-pay | Admitting: *Deleted

## 2014-10-14 DIAGNOSIS — Z800 Family history of malignant neoplasm of digestive organs: Secondary | ICD-10-CM

## 2014-10-14 DIAGNOSIS — Z1211 Encounter for screening for malignant neoplasm of colon: Secondary | ICD-10-CM

## 2014-10-19 ENCOUNTER — Telehealth (INDEPENDENT_AMBULATORY_CARE_PROVIDER_SITE_OTHER): Payer: Self-pay | Admitting: *Deleted

## 2014-10-19 DIAGNOSIS — Z1211 Encounter for screening for malignant neoplasm of colon: Secondary | ICD-10-CM

## 2014-10-19 NOTE — Telephone Encounter (Signed)
Patient needs movi prep 

## 2014-10-20 MED ORDER — PEG-KCL-NACL-NASULF-NA ASC-C 100 G PO SOLR: 1.0000 | Freq: Once | ORAL | Status: DC

## 2014-11-06 ENCOUNTER — Encounter: Payer: Self-pay | Admitting: Nurse Practitioner

## 2014-11-06 ENCOUNTER — Ambulatory Visit (INDEPENDENT_AMBULATORY_CARE_PROVIDER_SITE_OTHER): Payer: PRIVATE HEALTH INSURANCE | Admitting: Nurse Practitioner

## 2014-11-06 VITALS — BP 140/92 | Ht 66.0 in | Wt 243.0 lb

## 2014-11-06 DIAGNOSIS — F419 Anxiety disorder, unspecified: Secondary | ICD-10-CM | POA: Diagnosis not present

## 2014-11-06 DIAGNOSIS — F430 Acute stress reaction: Principal | ICD-10-CM

## 2014-11-06 DIAGNOSIS — F411 Generalized anxiety disorder: Secondary | ICD-10-CM

## 2014-11-06 MED ORDER — ESCITALOPRAM OXALATE 10 MG PO TABS: 10.0000 mg | ORAL_TABLET | Freq: Every day | ORAL | Status: DC

## 2014-11-07 ENCOUNTER — Encounter: Payer: Self-pay | Admitting: Nurse Practitioner

## 2014-11-07 DIAGNOSIS — F411 Generalized anxiety disorder: Secondary | ICD-10-CM | POA: Insufficient documentation

## 2014-11-07 DIAGNOSIS — F430 Acute stress reaction: Principal | ICD-10-CM

## 2014-11-07 DIAGNOSIS — F419 Anxiety disorder, unspecified: Secondary | ICD-10-CM | POA: Insufficient documentation

## 2014-11-07 NOTE — Progress Notes (Signed)
Subjective:  Presents for recheck. Doing better on Lexapro. Sleeping well. Has begun regular exercise program x 3 weeks.   Objective:   BP 140/92 mmHg  Ht 5\' 6"  (1.676 m)  Wt 243 lb (110.224 kg)  BMI 39.24 kg/m2 NAD. Alert, oriented. Cheerful affect. Lungs clear. Heart RRR.   Assessment:  Problem List Items Addressed This Visit      Other   Anxiety as acute reaction to exceptional stress - Primary   Relevant Medications   escitalopram (LEXAPRO) tablet       Plan:  Meds ordered this encounter  Medications  . escitalopram (LEXAPRO) 10 MG tablet    Sig: Take 1 tablet (10 mg total) by mouth daily.    Dispense:  30 tablet    Refill:  5    Order Specific Question:  Supervising Provider    Answer:  Mikey Kirschner [[id number]]   Continue exercise and weight loss efforts.  Return in about 6 months (around 05/09/2015). Call back sooner if problems.

## 2014-11-16 ENCOUNTER — Telehealth (INDEPENDENT_AMBULATORY_CARE_PROVIDER_SITE_OTHER): Payer: Self-pay | Admitting: *Deleted

## 2014-11-16 NOTE — Telephone Encounter (Signed)
agree

## 2014-11-16 NOTE — Telephone Encounter (Signed)
Referring MD/PCP: scott luking   Procedure: tcs  Reason/Indication:  Screening, fam hx colon ca  Has patient had this procedure before?  no  If so, when, by whom and where?    Is there a family history of colon cancer?  Yes, paternal grandmother  Who?  What age when diagnosed?    Is patient diabetic?   yes      Does patient have prosthetic heart valve?  no  Do you have a pacemaker?  no  Has patient ever had endocarditis? no  Has patient had joint replacement within last 12 months?  no  Does patient tend to be constipated or take laxatives? no  Is patient on Coumadin, Plavix and/or Aspirin? yes  Medications: asa 81 mg daily, vit d 50,000 units 1 tab weekly, losartan 50 mg daily, metformin 500 mg bid, escitalopram 10 mg daily, tanzeum 50 mg 1 injection weekly, nasal spray prn, zyrtec prn, parafon prn  Allergies: nkda  Medication Adjustment: asa 2 days, hold metformin evening before & morning of  Procedure date & time: 12/16/14 at 830

## 2014-12-01 ENCOUNTER — Encounter: Payer: Self-pay | Admitting: Nurse Practitioner

## 2014-12-16 ENCOUNTER — Ambulatory Visit (HOSPITAL_COMMUNITY)
Admission: RE | Admit: 2014-12-16 | Discharge: 2014-12-16 | Disposition: A | Discharge status: Home / Self Care | Payer: PRIVATE HEALTH INSURANCE | Source: Ambulatory Visit | Attending: Internal Medicine | Admitting: Internal Medicine

## 2014-12-16 ENCOUNTER — Encounter (HOSPITAL_COMMUNITY)
Admission: RE | Disposition: A | Discharge status: Home / Self Care | Payer: Self-pay | Source: Ambulatory Visit | Attending: Internal Medicine

## 2014-12-16 ENCOUNTER — Encounter (HOSPITAL_COMMUNITY): Payer: Self-pay | Admitting: *Deleted

## 2014-12-16 DIAGNOSIS — I10 Essential (primary) hypertension: Secondary | ICD-10-CM | POA: Diagnosis not present

## 2014-12-16 DIAGNOSIS — K644 Residual hemorrhoidal skin tags: Secondary | ICD-10-CM | POA: Diagnosis not present

## 2014-12-16 DIAGNOSIS — K5730 Diverticulosis of large intestine without perforation or abscess without bleeding: Secondary | ICD-10-CM | POA: Diagnosis not present

## 2014-12-16 DIAGNOSIS — E282 Polycystic ovarian syndrome: Secondary | ICD-10-CM | POA: Diagnosis not present

## 2014-12-16 DIAGNOSIS — Z7951 Long term (current) use of inhaled steroids: Secondary | ICD-10-CM | POA: Insufficient documentation

## 2014-12-16 DIAGNOSIS — Z800 Family history of malignant neoplasm of digestive organs: Secondary | ICD-10-CM | POA: Insufficient documentation

## 2014-12-16 DIAGNOSIS — D125 Benign neoplasm of sigmoid colon: Secondary | ICD-10-CM | POA: Insufficient documentation

## 2014-12-16 DIAGNOSIS — Z7982 Long term (current) use of aspirin: Secondary | ICD-10-CM | POA: Diagnosis not present

## 2014-12-16 DIAGNOSIS — Z79899 Other long term (current) drug therapy: Secondary | ICD-10-CM | POA: Insufficient documentation

## 2014-12-16 DIAGNOSIS — K6289 Other specified diseases of anus and rectum: Secondary | ICD-10-CM | POA: Insufficient documentation

## 2014-12-16 DIAGNOSIS — K648 Other hemorrhoids: Secondary | ICD-10-CM | POA: Diagnosis not present

## 2014-12-16 DIAGNOSIS — Z1211 Encounter for screening for malignant neoplasm of colon: Secondary | ICD-10-CM | POA: Diagnosis not present

## 2014-12-16 HISTORY — PX: COLONOSCOPY: SHX5424

## 2014-12-16 LAB — GLUCOSE, CAPILLARY: GLUCOSE-CAPILLARY: 105 mg/dL — AB (ref 70–99)

## 2014-12-16 SURGERY — COLONOSCOPY
Anesthesia: Moderate Sedation

## 2014-12-16 MED ORDER — STERILE WATER FOR IRRIGATION IR SOLN
Status: DC | PRN
  Administered 2014-12-16: 09:00:00

## 2014-12-16 MED ORDER — SODIUM CHLORIDE 0.9 % IV SOLN
INTRAVENOUS | Status: DC
  Administered 2014-12-16: 1000 mL via INTRAVENOUS

## 2014-12-16 MED ORDER — MEPERIDINE HCL 50 MG/ML IJ SOLN
INTRAMUSCULAR | Status: DC | PRN
  Administered 2014-12-16 (×2): 25 mg via INTRAVENOUS

## 2014-12-16 MED ORDER — MIDAZOLAM HCL 5 MG/5ML IJ SOLN
INTRAMUSCULAR | Status: DC | PRN
  Administered 2014-12-16 (×5): 2 mg via INTRAVENOUS

## 2014-12-16 MED ORDER — MIDAZOLAM HCL 5 MG/5ML IJ SOLN
INTRAMUSCULAR | Status: AC
  Filled 2014-12-16: qty 10

## 2014-12-16 MED ORDER — MEPERIDINE HCL 50 MG/ML IJ SOLN
INTRAMUSCULAR | Status: AC
  Filled 2014-12-16: qty 1

## 2014-12-16 NOTE — H&P (Signed)
Susan Becker is an 54 y.o. female.   Chief Complaint: Patient is here for colonoscopy. HPI: Patient is 54 year old Caucasian female who is in for screening colonoscopy. She denies abdominal pain change in bowel habits or rectal bleeding. This is patient's first exam. Family history is positive for CRC in maternal grandmother who was in her 16s of the time of diagnosis.  Past Medical History  Diagnosis Date  . PCOS (polycystic ovarian syndrome)     per gyn  . Impaired fasting glucose 04/2006  . HTN (hypertension) 09/2010    History reviewed. No pertinent past surgical history.  Family History  Problem Relation Age of Onset  . Hypertension Mother   . Diabetes Mother   . Hypertension Father   . Hyperlipidemia Father   . Cancer Paternal Grandmother 95    colon   Social History:  reports that she has never smoked. She does not have any smokeless tobacco history on file. Her alcohol and drug histories are not on file.  Allergies:  Allergies  Allergen Reactions  . Zithromax [Azithromycin]     Doesn't work    Medications Prior to Admission  Medication Sig Dispense Refill  . aspirin 81 MG tablet Take 81 mg by mouth daily.    Marland Kitchen escitalopram (LEXAPRO) 10 MG tablet Take 1 tablet (10 mg total) by mouth daily. 30 tablet 5  . losartan (COZAAR) 50 MG tablet Take 1 tablet (50 mg total) by mouth every morning. 30 tablet 5  . metFORMIN (GLUCOPHAGE) 500 MG tablet TAKE 1 TABLET BY MOUTH 2 TIMES DAILY WITH A MEAL. 60 tablet 5  . peg 3350 powder (MOVIPREP) 100 G SOLR Take 1 kit (200 g total) by mouth once. 1 kit 0  . TANZEUM 50 MG PEN INJECT SUBCUTANEOUSLY ONCE A WEEK AS DIRECTED 4 each 5  . Vitamin D, Ergocalciferol, (DRISDOL) 50000 UNITS CAPS capsule Take 1 capsule (50,000 Units total) by mouth every 7 (seven) days. 4 capsule 1  . chlorzoxazone (PARAFON) 500 MG tablet One po qhs prn muscle spasms (Patient not taking: Reported on 12/01/2014) 30 tablet 0  . fluticasone (FLONASE) 50 MCG/ACT nasal  spray Place 2 sprays into both nostrils daily. 16 g 5  . ranitidine (ZANTAC) 300 MG tablet Take 1 tablet (300 mg total) by mouth at bedtime. 30 tablet 2    Results for orders placed or performed during the hospital encounter of 12/16/14 (from the past 48 hour(s))  Glucose, capillary     Status: Abnormal   Collection Time: 12/16/14  7:47 AM  Result Value Ref Range   Glucose-Capillary 105 (H) 70 - 99 mg/dL   No results found.  ROS  Blood pressure 183/86, pulse 81, temperature 97.9 F (36.6 C), temperature source Oral, resp. rate 23, height $RemoveBe'5\' 7"'[id number]$  (1.702 m), weight 240 lb (108.863 kg), SpO2 97 %. Physical Exam  Constitutional: She appears well-developed and well-nourished.  HENT:  Mouth/Throat: Oropharynx is clear and moist.  Eyes: Conjunctivae are normal. No scleral icterus.  Neck: No thyromegaly present.  Cardiovascular: Normal rate, regular rhythm and normal heart sounds.   No murmur heard. Respiratory: Effort normal.  GI: Soft. She exhibits no distension and no mass. There is no tenderness.  Musculoskeletal: She exhibits no edema.  Lymphadenopathy:    She has no cervical adenopathy.  Neurological: She is alert.  Skin: Skin is warm and dry.     Assessment/Plan Average risk screening colonoscopy. Family history of CRC in 1 second-degree relative.  REHMAN,NAJEEB U 12/16/2014, 8:32  AM    

## 2014-12-16 NOTE — Op Note (Signed)
COLONOSCOPY PROCEDURE REPORT  PATIENT:  Susan Becker  MR#:  [id number] Birthdate:  [birth year], 54 y.o., female Endoscopist:  Dr. Rogene Houston, MD Referred By:  Dr. Sallee Lange, MD  Procedure Date: 12/16/2014  Procedure:   Colonoscopy with snare polypectomy  Indications: Patient is 54 year old Caucasian female who is here for average risk screening colonoscopy. Family history is positive for CRC and maternal grandmother who was in her 60s of the time of diagnosis.  Informed Consent:  The procedure and risks were reviewed with the patient and informed consent was obtained.  Medications:  Demerol 50 mg IV Versed 10 mg IV  Description of procedure:  After a digital rectal exam was performed, that colonoscope was advanced from the anus through the rectum and colon to the area of the cecum, ileocecal valve and appendiceal orifice. The cecum was deeply intubated. These structures were well-seen and photographed for the record. From the level of the cecum and ileocecal valve, the scope was slowly and cautiously withdrawn. The mucosal surfaces were carefully surveyed utilizing scope tip to flexion to facilitate fold flattening as needed. The scope was pulled down into the rectum where a thorough exam including retroflexion was performed.  Findings:   Prep excellent. 10 mm broad-based polyp hot snared from proximal sigmoid colon. No small diverticulum at sigmoid colon. Normal rectal mucosa. Small hemorrhoids below the dentate line along with anal papillae.   Therapeutic/Diagnostic Maneuvers Performed:  See above  Complications:  None  Cecal Withdrawal Time:  14 minutes  Impression:  Examination performed to cecum. 10 mm broad-based polyp hot snare from proximal sigmoid colon. Single small diverticulum at sigmoid colon. Small external hemorrhoids and three anal papillae.  Recommendations:  Standard instructions given. No aspirin or NSAIDs for 1 week. I will contact patient with  biopsy results and further recommendations.  Courtany Mcmurphy U  12/16/2014 9:21 AM  CC: Dr. Sallee Lange, MD & Dr. Rayne Du ref. provider found

## 2014-12-16 NOTE — Discharge Instructions (Signed)
No aspirin or NSAIDs for 1 week. Resume other medications and diet as before. No driving for 24 hours. Physician will call with biopsy results.    Colonoscopy, Care After These instructions give you information on caring for yourself after your procedure. Your doctor may also give you more specific instructions. Call your doctor if you have any problems or questions after your procedure. HOME CARE  Do not drive for 24 hours.  Do not sign important papers or use machinery for 24 hours.  You may shower.  You may go back to your usual activities, but go slower for the first 24 hours.  Take rest breaks often during the first 24 hours.  Walk around or use warm packs on your belly (abdomen) if you have belly cramping or gas.  Drink enough fluids to keep your pee (urine) clear or pale yellow.  Resume your normal diet. Avoid heavy or fried foods.  Avoid drinking alcohol for 24 hours or as told by your doctor.  Only take medicines as told by your doctor. If a tissue sample (biopsy) was taken during the procedure:   Do not take aspirin or blood thinners for 7 days, or as told by your doctor.  Do not drink alcohol for 7 days, or as told by your doctor.  Eat soft foods for the first 24 hours. GET HELP IF: You still have a small amount of blood in your poop (stool) 2-3 days after the procedure. GET HELP RIGHT AWAY IF:  You have more than a small amount of blood in your poop.  You see clumps of tissue (blood clots) in your poop.  Your belly is puffy (swollen).  You feel sick to your stomach (nauseous) or throw up (vomit).  You have a fever.  You have belly pain that gets worse and medicine does not help. MAKE SURE YOU:  Understand these instructions.  Will watch your condition.  Will get help right away if you are not doing well or get worse. Document Released: 09/09/2010 Document Revised: 08/12/2013 Document Reviewed: 04/14/2013 [facility] Patient Information 2015  [place], [place]. This information is not intended to replace advice given to you by your health care provider. Make sure you discuss any questions you have with your health care provider.   Diverticulosis Diverticulosis is the condition that develops when small pouches (diverticula) form in the wall of your colon. Your colon, or large intestine, is where water is absorbed and stool is formed. The pouches form when the inside layer of your colon pushes through weak spots in the outer layers of your colon. CAUSES  No one knows exactly what causes diverticulosis. RISK FACTORS  Being older than 75. Your risk for this condition increases with age. Diverticulosis is rare in people younger than 40 years. By age 3, almost everyone has it.  Eating a low-fiber diet.  Being frequently constipated.  Being overweight.  Not getting enough exercise.  Smoking.  Taking over-the-counter pain medicines, like aspirin and ibuprofen. SYMPTOMS  Most people with diverticulosis do not have symptoms. DIAGNOSIS  Because diverticulosis often has no symptoms, health care providers often discover the condition during an exam for other colon problems. In many cases, a health care provider will diagnose diverticulosis while using a flexible scope to examine the colon (colonoscopy). TREATMENT  If you have never developed an infection related to diverticulosis, you may not need treatment. If you have had an infection before, treatment may include:  Eating more fruits, vegetables, and grains.  Taking a  fiber supplement.  Taking a live bacteria supplement (probiotic).  Taking medicine to relax your colon. HOME CARE INSTRUCTIONS   Drink at least 6-8 glasses of water each day to prevent constipation.  Try not to strain when you have a bowel movement.  Keep all follow-up appointments. If you have had an infection before:  Increase the fiber in your diet as directed by your health care provider or  dietitian.  Take a dietary fiber supplement if your health care provider approves.  Only take medicines as directed by your health care provider. SEEK MEDICAL CARE IF:   You have abdominal pain.  You have bloating.  You have cramps.  You have not gone to the bathroom in 3 days. SEEK IMMEDIATE MEDICAL CARE IF:   Your pain gets worse.  Yourbloating becomes very bad.  You have a fever or chills, and your symptoms suddenly get worse.  You begin vomiting.  You have bowel movements that are bloody or black. MAKE SURE YOU:  Understand these instructions.  Will watch your condition.  Will get help right away if you are not doing well or get worse. Document Released: 05/04/2004 Document Revised: 08/12/2013 Document Reviewed: 07/02/2013 [facility] Patient Information 2015 [place], [place]. This information is not intended to replace advice given to you by your health care provider. Make sure you discuss any questions you have with your health care provider.  Colon Polyps Polyps are lumps of extra tissue growing inside the body. Polyps can grow in the large intestine (colon). Most colon polyps are noncancerous (benign). However, some colon polyps can become cancerous over time. Polyps that are larger than a pea may be harmful. To be safe, caregivers remove and test all polyps. CAUSES  Polyps form when mutations in the genes cause your cells to grow and divide even though no more tissue is needed. RISK FACTORS There are a number of risk factors that can increase your chances of getting colon polyps. They include:  Being older than 50 years.  Family history of colon polyps or colon cancer.  Long-term colon diseases, such as colitis or Crohn disease.  Being overweight.  Smoking.  Being inactive.  Drinking too much alcohol. SYMPTOMS  Most small polyps do not cause symptoms. If symptoms are present, they may include:  Blood in the stool. The stool may look dark red or  black.  Constipation or diarrhea that lasts longer than 1 week. DIAGNOSIS People often do not know they have polyps until their caregiver finds them during a regular checkup. Your caregiver can use 4 tests to check for polyps:  Digital rectal exam. The caregiver wears gloves and feels inside the rectum. This test would find polyps only in the rectum.  Barium enema. The caregiver puts a liquid called barium into your rectum before taking X-rays of your colon. Barium makes your colon look white. Polyps are dark, so they are easy to see in the X-ray pictures.  Sigmoidoscopy. A thin, flexible tube (sigmoidoscope) is placed into your rectum. The sigmoidoscope has a light and tiny camera in it. The caregiver uses the sigmoidoscope to look at the last third of your colon.  Colonoscopy. This test is like sigmoidoscopy, but the caregiver looks at the entire colon. This is the most common method for finding and removing polyps. TREATMENT  Any polyps will be removed during a sigmoidoscopy or colonoscopy. The polyps are then tested for cancer. PREVENTION  To help lower your risk of getting more colon polyps:  Eat plenty of  fruits and vegetables. Avoid eating fatty foods.  Do not smoke.  Avoid drinking alcohol.  Exercise every day.  Lose weight if recommended by your caregiver.  Eat plenty of calcium and folate. Foods that are rich in calcium include milk, cheese, and broccoli. Foods that are rich in folate include chickpeas, kidney beans, and spinach. HOME CARE INSTRUCTIONS Keep all follow-up appointments as directed by your caregiver. You may need periodic exams to check for polyps. SEEK MEDICAL CARE IF: You notice bleeding during a bowel movement. Document Released: 05/03/2004 Document Revised: 10/30/2011 Document Reviewed: 10/17/2011 [facility] Patient Information 2015 [place], [place]. This information is not intended to replace advice given to you by your health care provider. Make sure you  discuss any questions you have with your health care provider.

## 2014-12-17 ENCOUNTER — Encounter (HOSPITAL_COMMUNITY): Payer: Self-pay | Admitting: Internal Medicine

## 2014-12-17 ENCOUNTER — Ambulatory Visit (INDEPENDENT_AMBULATORY_CARE_PROVIDER_SITE_OTHER): Payer: PRIVATE HEALTH INSURANCE | Admitting: Nurse Practitioner

## 2014-12-17 VITALS — BP 110/78 | Ht 67.0 in | Wt 247.6 lb

## 2014-12-17 DIAGNOSIS — L918 Other hypertrophic disorders of the skin: Secondary | ICD-10-CM

## 2014-12-20 ENCOUNTER — Encounter: Payer: Self-pay | Admitting: Nurse Practitioner

## 2014-12-20 NOTE — Progress Notes (Signed)
Subjective:  Presents for skin tag removal.  Objective:   BP 110/78 mmHg  Ht 5\' 7"  (1.702 m)  Wt 247 lb 9.6 oz (112.311 kg)  BMI 38.77 kg/m2 Multiple small skin tags removed from the neck and axillary area. Minimal bleeding. Tolerated procedure without difficulty.  Assessment: Cutaneous skin tags  Plan: Reviewed skin care. Call back if any problems. Patient understands that skin tags may recur.

## 2014-12-22 ENCOUNTER — Telehealth: Payer: Self-pay | Admitting: Nurse Practitioner

## 2014-12-22 NOTE — Telephone Encounter (Signed)
Susan Becker,   Patient was seen on 06/03/2014 and her insurance refused to pay the claim due to Morbid obesity being primary.  We changed this as far as the billing side and have tried to resubmit but still getting denials.  We are trying to cover all of our bases so would you be able to do an addendum to the OV note from this DOS to show that diabetes was the primary Dx?

## 2014-12-23 NOTE — Telephone Encounter (Signed)
I made appropriate changes to note including primary diagnosis and progress notes for that day.

## 2014-12-23 NOTE — Telephone Encounter (Signed)
It was for DOS: 06/03/2014.  Her insurance is refusing to pay because obesity was originally sent as the primary diagnosis.  We have since then changed the diagnosis places on the claim but they are still denying it.  We are hoping that if the OV note is changed then maybe we can get it paid.

## 2014-12-23 NOTE — Telephone Encounter (Signed)
Which visit? The last regular office visit in February has HTN as primary.

## 2014-12-28 ENCOUNTER — Other Ambulatory Visit: Payer: Self-pay | Admitting: Nurse Practitioner

## 2015-01-04 ENCOUNTER — Ambulatory Visit (INDEPENDENT_AMBULATORY_CARE_PROVIDER_SITE_OTHER): Payer: PRIVATE HEALTH INSURANCE | Admitting: Nurse Practitioner

## 2015-01-04 ENCOUNTER — Encounter: Payer: Self-pay | Admitting: Nurse Practitioner

## 2015-01-04 VITALS — BP 132/78 | Ht 67.0 in | Wt 244.8 lb

## 2015-01-04 DIAGNOSIS — M778 Other enthesopathies, not elsewhere classified: Secondary | ICD-10-CM

## 2015-01-04 DIAGNOSIS — G629 Polyneuropathy, unspecified: Secondary | ICD-10-CM | POA: Diagnosis not present

## 2015-01-04 DIAGNOSIS — M7702 Medial epicondylitis, left elbow: Secondary | ICD-10-CM

## 2015-01-04 MED ORDER — NAPROXEN 500 MG PO TABS: 500.0000 mg | ORAL_TABLET | Freq: Two times a day (BID) | ORAL | Status: DC

## 2015-01-04 NOTE — Progress Notes (Signed)
Subjective:  Presents complaints of numbness and tingling and pain at the left medial elbow and fingers of the left hand including thumb and index and middle. No specific history of injury. Works at a [employer]. 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 Recently stopped this second job. Has had neuropathy in her hand for several months, intermittent intensity. Elbow symptoms are new over the past 3 days. Hurts to lift even small items. No neck or shoulder pain. Has tried ibuprofen 800 mg for the pain.  Objective:   BP 132/78 mmHg  Ht 5\' 7"  (1.702 m)  Wt 244 lb 12.8 oz (111.041 kg)  BMI 38.33 kg/m2 NAD. Alert, oriented. Tenderness at the medial epicondyles. No tenderness in the forearm. Phalen and Tinel's test negative. Positive wrist tendinitis with flexion and hyperextension. Hand strength 5+ bilateral. Strong radial pulse. Fingers warm with good capillary refill. Sensation diminished on the left as compared to the right.  Assessment: Medial epicondylitis, left  Left wrist tendinitis  Neuropathy left hand  Plan:  Meds ordered this encounter  Medications  . naproxen (NAPROSYN) 500 MG tablet    Sig: Take 1 tablet (500 mg total) by mouth 2 (two) times daily with a meal.    Dispense:  30 tablet    Refill:  0    Order Specific Question:  Supervising Provider    Answer:  Maggie Font   Given prescription for a wrist brace. Ice applications. Anti-inflammatories as directed. Call back in 10-14 days if no significant improvement, sooner if worse.

## 2015-01-04 NOTE — Patient Instructions (Signed)
Medial Epicondylitis (Golfer's Elbow) with Rehab Medial epicondylitis involves inflammation and pain around the inner (medial) portion of the elbow. This pain is caused by inflammation of the tendons in the forearm that flex (bring down) the wrist. Medial epicondylitis is also called golfer's elbow, because it is common among golfers. However, it may occur in any individual who flexes the wrist regularly. If medial epicondylitis is left untreated, it may become a chronic problem. SYMPTOMS   Pain, tenderness, or inflammation over the inner (medial) side of the elbow.  Pain or weakness with gripping activities.  Pain that increases with wrist twisting motions (using a screwdriver, playing golf, bowling). CAUSES  Medial epicondylitis is caused by inflammation of the tendons that flex the wrist. Causes of injury may include:  Chronic, repetitive stress and strain to the tendons that run from the wrist and forearm to the elbow.  Sudden strain on the forearm, including wrist snap when serving balls with racquet sports, or throwing a baseball. RISK INCREASES WITH:  Sports or occupations that require repetitive and/or strenuous forearm and wrist movements (pitching a baseball, golfing, carpentry).  Poor wrist and forearm strength and flexibility.  Failure to warm up properly before activity.  Resuming activity before healing, rehabilitation, and conditioning are complete. PREVENTION   Warm up and stretch properly before activity.  Maintain physical fitness:  Strength, flexibility, and endurance.  Cardiovascular fitness.  Wear and use properly fitted equipment.  Learn and use proper technique and have a coach correct improper technique.  Wear a tennis elbow (counterforce) brace. PROGNOSIS  The course of this condition depends on the degree of the injury. If treated properly, acute cases (symptoms lasting less than 4 weeks) are often resolved in 2 to 6 weeks. Chronic (longer lasting  cases) often resolve in 3 to 6 months, but may require physical therapy. RELATED COMPLICATIONS   Frequently recurring symptoms, resulting in a chronic problem. Properly treating the problem the first time decreases frequency of recurrence.  Chronic inflammation, scarring, and partial tendon tear, requiring surgery.  Delayed healing or resolution of symptoms. TREATMENT  Treatment first involves the use of ice and medicine, to reduce pain and inflammation. Strengthening and stretching exercises may reduce discomfort, if performed regularly. These exercises may be performed at home, if the condition is an acute injury. Chronic cases may require a referral to a physical therapist for evaluation and treatment. Your caregiver may advise a corticosteroid injection to help reduce inflammation. Rarely, surgery is needed. MEDICATION  If pain medicine is needed, nonsteroidal anti-inflammatory medicines (aspirin and ibuprofen), or other minor pain relievers (acetaminophen), are often advised.  Do not take pain medicine for 7 days before surgery.  Prescription pain relievers may be given, if your caregiver thinks they are needed. Use only as directed and only as much as you need.  Corticosteroid injections may be recommended. These injections should be reserved only for the most severe cases, because they can only be given a certain number of times. HEAT AND COLD  Cold treatment (icing) should be applied for 10 to 15 minutes every 2 to 3 hours for inflammation and pain, and immediately after activity that aggravates your symptoms. Use ice packs or an ice massage.  Heat treatment may be used before performing stretching and strengthening activities prescribed by your caregiver, physical therapist, or athletic trainer. Use a heat pack or a warm water soak. SEEK MEDICAL CARE IF: Symptoms get worse or do not improve in 2 weeks, despite treatment. EXERCISES  RANGE OF MOTION (  ROM) AND STRETCHING EXERCISES -  Epicondylitis, Medial (Golfer's Elbow) These exercises may help you when beginning to rehabilitate your injury. Your symptoms may go away with or without further involvement from your physician, physical therapist or athletic trainer. While completing these exercises, remember:   Restoring tissue flexibility helps normal motion to return to the joints. This allows healthier, less painful movement and activity.  An effective stretch should be held for at least 30 seconds.  A stretch should never be painful. You should only feel a gentle lengthening or release in the stretched tissue. RANGE OF MOTION - Wrist Flexion, Active-Assisted  Extend your right / left elbow with your fingers pointing down.*  Gently pull the back of your hand towards you, until you feel a gentle stretch on the top of your forearm.  Hold this position for __________ seconds. Repeat __________ times. Complete this exercise __________ times per day.  *If directed by your physician, physical therapist or athletic trainer, complete this stretch with your elbow bent, rather than extended. RANGE OF MOTION - Wrist Extension, Active-Assisted  Extend your right / left elbow and turn your palm upwards.*  Gently pull your palm and fingertips back, so your wrist extends and your fingers point more toward the ground.  You should feel a gentle stretch on the inside of your forearm.  Hold this position for __________ seconds. Repeat __________ times. Complete this exercise __________ times per day. *If directed by your physician, physical therapist or athletic trainer, complete this stretch with your elbow bent, rather than extended. STRETCH - Wrist Extension   Place your right / left fingertips on a tabletop leaving your elbow slightly bent. Your fingers should point backwards.  Gently press your fingers and palm down onto the table, by straightening your elbow. You should feel a stretch on the inside of your forearm.  Hold  this position for __________ seconds. Repeat __________ times. Complete this stretch __________ times per day.  STRENGTHENING EXERCISES - Epicondylitis, Medial (Golfer's Elbow) These exercises may help you when beginning to rehabilitate your injury. They may resolve your symptoms with or without further involvement from your physician, physical therapist or athletic trainer. While completing these exercises, remember:   Muscles can gain both the endurance and the strength needed for everyday activities through controlled exercises.  Complete these exercises as instructed by your physician, physical therapist or athletic trainer. Increase the resistance and repetitions only as guided.  You may experience muscle soreness or fatigue, but the pain or discomfort you are trying to eliminate should never worsen during these exercises. If this pain does get worse, stop and make sure you are following the directions exactly. If the pain is still present after adjustments, discontinue the exercise until you can discuss the trouble with your caregiver. STRENGTH - Wrist Flexors  Sit with your right / left forearm palm-up, and fully supported on a table or countertop. Your elbow should be resting below the height of your shoulder. Allow your wrist to extend over the edge of the surface.  Loosely holding a __________ weight, or a piece of rubber exercise band or tubing, slowly curl your hand up toward your forearm.  Hold this position for __________ seconds. Slowly lower the wrist back to the starting position in a controlled manner. Repeat __________ times. Complete this exercise __________ times per day.  STRENGTH - Wrist Extensors  Sit with your right / left forearm palm-down and fully supported. Your elbow should be resting below the height of your shoulder.   Allow your wrist to extend over the edge of the surface.  Loosely holding a __________ weight, or a piece of rubber exercise band or tubing, slowly  curl your hand up toward your forearm.  Hold this position for __________ seconds. Slowly lower the wrist back to the starting position in a controlled manner. Repeat __________ times. Complete this exercise __________ times per day.  STRENGTH - Ulnar Deviators  Stand with a ____________________ weight in your right / left hand, or sit while holding a rubber exercise band or tubing, with your healthy arm supported on a table or countertop.  Move your wrist so that your pinkie travels toward your forearm and your thumb moves away from your forearm.  Hold this position for __________ seconds and then slowly lower the wrist back to the starting position. Repeat __________ times. Complete this exercise __________ times per day STRENGTH - Grip   Grasp a tennis ball, a dense sponge, or a large, rolled sock in your hand.  Squeeze as hard as you can, without increasing any pain.  Hold this position for __________ seconds. Release your grip slowly. Repeat __________ times. Complete this exercise __________ times per day.  STRENGTH - Forearm Supinators   Sit with your right / left forearm supported on a table, keeping your elbow below shoulder height. Rest your hand over the edge, palm down.  Gently grip a hammer or a soup ladle.  Without moving your elbow, slowly turn your palm and hand upward to a "thumbs-up" position.  Hold this position for __________ seconds. Slowly return to the starting position. Repeat __________ times. Complete this exercise __________ times per day.  STRENGTH - Forearm Pronators  Sit with your right / left forearm supported on a table, keeping your elbow below shoulder height. Rest your hand over the edge, palm up.  Gently grip a hammer or a soup ladle.  Without moving your elbow, slowly turn your palm and hand upward to a "thumbs-up" position.  Hold this position for __________ seconds. Slowly return to the starting position. Repeat __________ times. Complete  this exercise __________ times per day.  Document Released: 08/07/2005 Document Revised: 10/30/2011 Document Reviewed: 11/19/2008 [employer] Patient Information 2015 [employer]. This information is not intended to replace advice given to you by your health care provider. Make sure you discuss any questions you have with your health care provider.  

## 2015-03-15 ENCOUNTER — Other Ambulatory Visit: Payer: Self-pay | Admitting: Nurse Practitioner

## 2015-05-07 ENCOUNTER — Other Ambulatory Visit: Payer: Self-pay | Admitting: Family Medicine

## 2015-05-07 ENCOUNTER — Other Ambulatory Visit: Payer: Self-pay

## 2015-05-07 DIAGNOSIS — Z1231 Encounter for screening mammogram for malignant neoplasm of breast: Secondary | ICD-10-CM

## 2015-05-10 ENCOUNTER — Encounter: Payer: Self-pay | Admitting: Nurse Practitioner

## 2015-05-10 ENCOUNTER — Ambulatory Visit (INDEPENDENT_AMBULATORY_CARE_PROVIDER_SITE_OTHER): Payer: PRIVATE HEALTH INSURANCE | Admitting: Nurse Practitioner

## 2015-05-10 VITALS — BP 132/96 | Ht 67.0 in | Wt 245.0 lb

## 2015-05-10 DIAGNOSIS — I10 Essential (primary) hypertension: Secondary | ICD-10-CM

## 2015-05-10 DIAGNOSIS — F411 Generalized anxiety disorder: Secondary | ICD-10-CM

## 2015-05-10 DIAGNOSIS — E119 Type 2 diabetes mellitus without complications: Secondary | ICD-10-CM

## 2015-05-10 DIAGNOSIS — F419 Anxiety disorder, unspecified: Secondary | ICD-10-CM | POA: Diagnosis not present

## 2015-05-10 DIAGNOSIS — F430 Acute stress reaction: Secondary | ICD-10-CM

## 2015-05-10 LAB — POCT GLYCOSYLATED HEMOGLOBIN (HGB A1C): Hemoglobin A1C: 6

## 2015-05-10 MED ORDER — ESCITALOPRAM OXALATE 10 MG PO TABS: 10.0000 mg | ORAL_TABLET | Freq: Every day | ORAL | Status: DC

## 2015-05-10 MED ORDER — METFORMIN HCL 1000 MG PO TABS: 1000.0000 mg | ORAL_TABLET | Freq: Two times a day (BID) | ORAL | Status: DC

## 2015-05-10 NOTE — Progress Notes (Signed)
Subjective:  Presents for routine follow-up. Concerned because her fasting blood sugars are running a little higher than usual, FBS this morning 141. Requesting referral to dietitian for help with meal planning. Compliant with metformin 500 twice a day and tanzeum once a week. No chest pain/ischemic type pain or shortness of breath. Lost her mother a few weeks ago. Taking daily OTC vitamin D. Has not been taking Zantac, reflux symptoms have been stable.  Objective:   BP 132/96 mmHg  Ht 5\' 7"  (1.702 m)  Wt 245 lb (111.131 kg)  BMI 38.36 kg/m2 NAD. Alert, oriented. Lungs clear. Heart regular rate rhythm. Lower extremities no edema. Crying at times during office visit when discussing her mother. Results for orders placed or performed in visit on 05/10/15  POCT glycosylated hemoglobin (Hb A1C)  Result Value Ref Range   Hemoglobin A1C 6.0       Assessment:  Problem List Items Addressed This Visit      Cardiovascular and Mediastinum   Essential hypertension, benign     Endocrine   Type 2 diabetes mellitus - Primary   Relevant Medications   metFORMIN (GLUCOPHAGE) 1000 MG tablet   Other Relevant Orders   POCT glycosylated hemoglobin (Hb A1C) (Completed)     Other   Anxiety as acute reaction to exceptional stress   Relevant Medications   escitalopram (LEXAPRO) 10 MG tablet     Plan:  Meds ordered this encounter  Medications  . cholecalciferol (VITAMIN D) 1000 UNITS tablet    Sig: Take 1,000 Units by mouth daily.  Marland Kitchen escitalopram (LEXAPRO) 10 MG tablet    Sig: Take 1 tablet (10 mg total) by mouth daily.    Dispense:  30 tablet    Refill:  5    Order Specific Question:  Supervising Provider    Answer:  Mikey Kirschner [[id number]]  . metFORMIN (GLUCOPHAGE) 1000 MG tablet    Sig: Take 1 tablet (1,000 mg total) by mouth 2 (two) times daily with a meal.    Dispense:  60 tablet    Refill:  5    Order Specific Question:  Supervising Provider    Answer:  Mikey Kirschner [[id number]]    Hemoglobin A1c remains stable. Continue Lexapro at same dose. Increase metformin to 1000 twice a day if tolerated. Will refer to a diabetes program for meal planning and counseling. Strongly encourage regular activity and weight loss. Has an appointment on 10/12 for her wellness physical. Otherwise routine follow-up in 6 months.

## 2015-05-10 NOTE — Patient Instructions (Signed)
Black cohosh

## 2015-05-12 ENCOUNTER — Ambulatory Visit (HOSPITAL_COMMUNITY): Payer: PRIVATE HEALTH INSURANCE

## 2015-05-12 ENCOUNTER — Other Ambulatory Visit: Payer: Self-pay | Admitting: Family Medicine

## 2015-05-12 ENCOUNTER — Ambulatory Visit (HOSPITAL_COMMUNITY)
Admission: RE | Admit: 2015-05-12 | Discharge: 2015-05-12 | Disposition: A | Discharge status: Home / Self Care | Payer: PRIVATE HEALTH INSURANCE | Source: Ambulatory Visit | Attending: Family Medicine | Admitting: Family Medicine

## 2015-05-12 DIAGNOSIS — Z1231 Encounter for screening mammogram for malignant neoplasm of breast: Secondary | ICD-10-CM | POA: Diagnosis not present

## 2015-05-17 ENCOUNTER — Encounter: Payer: Self-pay | Admitting: Nurse Practitioner

## 2015-05-31 ENCOUNTER — Encounter: Payer: Self-pay | Admitting: Nurse Practitioner

## 2015-06-02 ENCOUNTER — Encounter: Payer: Self-pay | Admitting: Nurse Practitioner

## 2015-06-02 ENCOUNTER — Ambulatory Visit (INDEPENDENT_AMBULATORY_CARE_PROVIDER_SITE_OTHER): Payer: PRIVATE HEALTH INSURANCE | Admitting: Nurse Practitioner

## 2015-06-02 VITALS — BP 142/88 | HR 80 | Ht 65.0 in | Wt 254.0 lb

## 2015-06-02 DIAGNOSIS — Z0000 Encounter for general adult medical examination without abnormal findings: Secondary | ICD-10-CM | POA: Diagnosis not present

## 2015-06-02 DIAGNOSIS — Z13220 Encounter for screening for lipoid disorders: Secondary | ICD-10-CM | POA: Diagnosis not present

## 2015-06-02 DIAGNOSIS — E119 Type 2 diabetes mellitus without complications: Secondary | ICD-10-CM | POA: Diagnosis not present

## 2015-06-02 DIAGNOSIS — E559 Vitamin D deficiency, unspecified: Secondary | ICD-10-CM | POA: Diagnosis not present

## 2015-06-02 DIAGNOSIS — Z79899 Other long term (current) drug therapy: Secondary | ICD-10-CM | POA: Diagnosis not present

## 2015-06-02 DIAGNOSIS — Z23 Encounter for immunization: Secondary | ICD-10-CM | POA: Diagnosis not present

## 2015-06-02 DIAGNOSIS — I10 Essential (primary) hypertension: Secondary | ICD-10-CM

## 2015-06-02 DIAGNOSIS — Z01419 Encounter for gynecological examination (general) (routine) without abnormal findings: Secondary | ICD-10-CM

## 2015-06-02 LAB — HM DIABETES EYE EXAM

## 2015-06-02 NOTE — Progress Notes (Signed)
   Subjective:    Patient ID: Susan Becker, female    DOB: [birth year], 54 y.o.   MRN: [id number]  HPI presents for her wellness exam. Married, same sexual partner. No menses since April 2016. No pelvic pain. Regular walking as exercise. Has not done well with her diet lately due to stress. Regular vision and dental exams.     Review of Systems  Constitutional: Negative for fever, activity change, appetite change and fatigue.  HENT: Negative for dental problem, ear pain, sinus pressure and sore throat.   Respiratory: Negative for cough, chest tightness, shortness of breath and wheezing.   Cardiovascular: Negative for chest pain.  Gastrointestinal: Negative for nausea, vomiting, abdominal pain, diarrhea, constipation and abdominal distention.  Genitourinary: Negative for dysuria, urgency, frequency, vaginal bleeding, vaginal discharge, enuresis, difficulty urinating, genital sores and pelvic pain.       Objective:   Physical Exam  Constitutional: She is oriented to person, place, and time. She appears well-developed. No distress.  Large waist circumference  HENT:  Right Ear: External ear normal.  Left Ear: External ear normal.  Mouth/Throat: Oropharynx is clear and moist.  Neck: Normal range of motion. Neck supple. No tracheal deviation present. No thyromegaly present.  Cardiovascular: Normal rate, regular rhythm and normal heart sounds.  Exam reveals no gallop.   No murmur heard. Pulmonary/Chest: Effort normal and breath sounds normal.  Abdominal: Soft. She exhibits no distension. There is no tenderness.  Genitourinary: Vagina normal and uterus normal. No vaginal discharge found.  External GU: no rashes or lesions. Vagina: no discharge. No CMT. Bimanual exam: no tenderness or obvious masses; exam limited due to abd girth.   Musculoskeletal: She exhibits no edema.  Lymphadenopathy:    She has no cervical adenopathy.  Neurological: She is alert and oriented to person, place, and  time.  Skin: Skin is warm and dry. No rash noted.  Psychiatric: She has a normal mood and affect. Her behavior is normal.  Vitals reviewed. Breast exam: fine nodularity bilat; no dominant masses; axillae no adenopathy.         Assessment & Plan:  Well woman exam - Plan: Lipid panel, Hepatic function panel, Basic metabolic panel, Microalbumin / creatinine urine ratio, Vit D  25 hydroxy (rtn osteoporosis monitoring)  Essential hypertension, benign - Plan: Basic metabolic panel  Type 2 diabetes mellitus without complication, without long-term current use of insulin ([place]) - Plan: Microalbumin / creatinine urine ratio  Vitamin D deficiency - Plan: Vit D  25 hydroxy (rtn osteoporosis monitoring)  Morbid obesity, unspecified obesity type (HCC)  High risk medication use - Plan: Hepatic function panel  Screening for lipid disorders - Plan: Lipid panel  Need for vaccination - Plan: Pneumococcal polysaccharide vaccine 23-valent greater than or equal to 2yo subcutaneous/IM, Pneumococcal polysaccharide vaccine 23-valent greater than or equal to 2yo subcutaneous/IM  Recommend weight loss and daily vitamin D/calcium supplementation.  Return in about 4 months (around 10/03/2015) for diabetes check up.

## 2015-06-08 ENCOUNTER — Other Ambulatory Visit: Payer: Self-pay

## 2015-06-08 MED ORDER — METFORMIN HCL 1000 MG PO TABS: 1000.0000 mg | ORAL_TABLET | Freq: Two times a day (BID) | ORAL | Status: DC

## 2015-06-28 ENCOUNTER — Other Ambulatory Visit: Payer: Self-pay | Admitting: Nurse Practitioner

## 2015-07-01 LAB — HEPATIC FUNCTION PANEL
ALT: 13 IU/L (ref 0–32)
AST: 18 IU/L (ref 0–40)
Albumin: 4.1 g/dL (ref 3.5–5.5)
Alkaline Phosphatase: 79 IU/L (ref 39–117)
BILIRUBIN TOTAL: 0.3 mg/dL (ref 0.0–1.2)
Bilirubin, Direct: 0.09 mg/dL (ref 0.00–0.40)
TOTAL PROTEIN: 7.3 g/dL (ref 6.0–8.5)

## 2015-07-01 LAB — BASIC METABOLIC PANEL
BUN / CREAT RATIO: 18 (ref 9–23)
BUN: 10 mg/dL (ref 6–24)
CHLORIDE: 101 mmol/L (ref 97–106)
CO2: 25 mmol/L (ref 18–29)
Calcium: 9.1 mg/dL (ref 8.7–10.2)
Creatinine, Ser: 0.57 mg/dL (ref 0.57–1.00)
GFR calc Af Amer: 124 mL/min/{1.73_m2} (ref >59)
GFR calc non Af Amer: 108 mL/min/{1.73_m2} (ref >59)
Glucose: 124 mg/dL — ABNORMAL HIGH (ref 65–99)
Potassium: 4.2 mmol/L (ref 3.5–5.2)
SODIUM: 140 mmol/L (ref 136–144)

## 2015-07-01 LAB — LIPID PANEL
CHOL/HDL RATIO: 4.6 ratio — AB (ref 0.0–4.4)
Cholesterol, Total: 194 mg/dL (ref 100–199)
HDL: 42 mg/dL (ref >39)
LDL CALC: 115 mg/dL — AB (ref 0–99)
Triglycerides: 184 mg/dL — ABNORMAL HIGH (ref 0–149)
VLDL CHOLESTEROL CAL: 37 mg/dL (ref 5–40)

## 2015-07-01 LAB — MICROALBUMIN / CREATININE URINE RATIO
Creatinine, Urine: 124 mg/dL
MICROALB/CREAT RATIO: 13.3 mg/g creat (ref 0.0–30.0)
Microalbumin, Urine: 16.5 ug/mL

## 2015-07-01 LAB — VITAMIN D 25 HYDROXY (VIT D DEFICIENCY, FRACTURES): Vit D, 25-Hydroxy: 26 ng/mL — ABNORMAL LOW (ref 30.0–100.0)

## 2015-08-06 ENCOUNTER — Other Ambulatory Visit: Payer: Self-pay | Admitting: Nurse Practitioner

## 2015-08-31 ENCOUNTER — Other Ambulatory Visit: Payer: Self-pay | Admitting: Nurse Practitioner

## 2015-09-20 ENCOUNTER — Encounter: Payer: Self-pay | Admitting: Nurse Practitioner

## 2015-09-20 ENCOUNTER — Telehealth: Payer: Self-pay | Admitting: Family Medicine

## 2015-09-20 NOTE — Telephone Encounter (Signed)
Patient wanting to know if she needed lab work done before she came to her appointment on 2/6 to see Hoyle Sauer for 4 mth follow up on diabetes.

## 2015-09-21 ENCOUNTER — Encounter: Payer: Self-pay | Admitting: Nurse Practitioner

## 2015-09-21 NOTE — Telephone Encounter (Signed)
Notified patient no labs needed at next visit but A1C which we can do in the office. Patient verbalized understanding.

## 2015-09-21 NOTE — Telephone Encounter (Signed)
No labs needed at next visit but A1C which we can do in the office.

## 2015-09-27 ENCOUNTER — Encounter: Payer: Self-pay | Admitting: Nurse Practitioner

## 2015-09-27 ENCOUNTER — Ambulatory Visit (INDEPENDENT_AMBULATORY_CARE_PROVIDER_SITE_OTHER): Payer: PRIVATE HEALTH INSURANCE | Admitting: Nurse Practitioner

## 2015-09-27 VITALS — BP 138/88 | Temp 98.9°F | Ht 66.0 in | Wt 252.0 lb

## 2015-09-27 DIAGNOSIS — J300 Vasomotor rhinitis: Secondary | ICD-10-CM | POA: Diagnosis not present

## 2015-09-27 DIAGNOSIS — R232 Flushing: Secondary | ICD-10-CM

## 2015-09-27 DIAGNOSIS — F419 Anxiety disorder, unspecified: Secondary | ICD-10-CM | POA: Diagnosis not present

## 2015-09-27 DIAGNOSIS — I10 Essential (primary) hypertension: Secondary | ICD-10-CM | POA: Diagnosis not present

## 2015-09-27 DIAGNOSIS — N951 Menopausal and female climacteric states: Secondary | ICD-10-CM | POA: Diagnosis not present

## 2015-09-27 DIAGNOSIS — E119 Type 2 diabetes mellitus without complications: Secondary | ICD-10-CM

## 2015-09-27 LAB — POCT GLYCOSYLATED HEMOGLOBIN (HGB A1C): Hemoglobin A1C: 6.2

## 2015-09-27 MED ORDER — ESCITALOPRAM OXALATE 20 MG PO TABS: 20.0000 mg | ORAL_TABLET | Freq: Every day | ORAL | Status: DC

## 2015-09-27 NOTE — Progress Notes (Signed)
Subjective:  Presents for routine follow-up. Has gone almost a year without a menstrual cycle. Continues to have significant hot flashes. Trying to get regular exercise by walking. Recently joined [employer]. Lost her mother back in the summer. Has been doing better emotionally lately although dealing with the estate. Also complaints of congestion that began 4 days ago. No fever. Occasional cough. Sinus area headache. Head congestion. Scratchy throat. No ear pain. Also no chest pain/ischemic type pain or shortness of breath. Acid reflux stable on Zantac and organic Apple cider vinegar.  Objective:   BP 138/88 mmHg  Temp(Src) 98.9 F (37.2 C) (Oral)  Ht 5\' 6"  (1.676 m)  Wt 252 lb (114.306 kg)  BMI 40.69 kg/m2 NAD. Alert, oriented. TMs clear effusion, no erythema. Pharynx clear. Neck supple with mild soft anterior adenopathy. Lungs clear. Heart regular rate rhythm. Abdomen soft nondistended nontender. Results for orders placed or performed in visit on 09/27/15  POCT glycosylated hemoglobin (Hb A1C)  Result Value Ref Range   Hemoglobin A1C 6.2      Assessment:  Problem List Items Addressed This Visit      Cardiovascular and Mediastinum   Essential hypertension, benign     Endocrine   Type 2 diabetes mellitus ([place]) - Primary   Relevant Orders   POCT glycosylated hemoglobin (Hb A1C) (Completed)    Other Visit Diagnoses    Anxiety        Relevant Medications    escitalopram (LEXAPRO) 20 MG tablet    Hot flashes        Vasomotor rhinitis            Plan:  Meds ordered this encounter  Medications  . escitalopram (LEXAPRO) 20 MG tablet    Sig: Take 1 tablet (20 mg total) by mouth daily.    Dispense:  30 tablet    Refill:  5    Order Specific Question:  Supervising Provider    Answer:  Mikey Kirschner [[id number]]   Increase Lexapro to 20 mg daily. Patient to call back if this does not significantly improve her hot flashes, consider low-dose gabapentin next. Cut back to 10 mg if any  problems with new dosing. Nasacort AQ as directed for head congestion. Encouraged healthy diet, weight loss and regular exercise. Return in about 4 months (around 01/25/2016) for recheck.

## 2015-09-27 NOTE — Patient Instructions (Signed)
Nasacort AQ as directed 

## 2015-10-04 ENCOUNTER — Ambulatory Visit: Payer: PRIVATE HEALTH INSURANCE | Admitting: Nurse Practitioner

## 2015-10-26 ENCOUNTER — Other Ambulatory Visit: Payer: Self-pay | Admitting: Family Medicine

## 2015-11-08 ENCOUNTER — Ambulatory Visit: Payer: PRIVATE HEALTH INSURANCE | Admitting: Nurse Practitioner

## 2015-12-01 ENCOUNTER — Encounter: Payer: Self-pay | Admitting: Nurse Practitioner

## 2015-12-01 ENCOUNTER — Other Ambulatory Visit: Payer: Self-pay | Admitting: Nurse Practitioner

## 2015-12-01 MED ORDER — FLUCONAZOLE 150 MG PO TABS: ORAL_TABLET | ORAL | Status: DC

## 2015-12-04 ENCOUNTER — Other Ambulatory Visit: Payer: Self-pay | Admitting: Nurse Practitioner

## 2015-12-04 MED ORDER — FLUCONAZOLE 150 MG PO TABS: ORAL_TABLET | ORAL | Status: DC

## 2015-12-16 ENCOUNTER — Other Ambulatory Visit: Payer: Self-pay | Admitting: Nurse Practitioner

## 2015-12-27 ENCOUNTER — Encounter: Payer: Self-pay | Admitting: Nurse Practitioner

## 2016-01-24 ENCOUNTER — Ambulatory Visit: Payer: PRIVATE HEALTH INSURANCE | Admitting: Nurse Practitioner

## 2016-01-24 ENCOUNTER — Other Ambulatory Visit: Payer: Self-pay | Admitting: Family Medicine

## 2016-01-25 ENCOUNTER — Ambulatory Visit: Payer: PRIVATE HEALTH INSURANCE | Admitting: Nurse Practitioner

## 2016-01-27 ENCOUNTER — Ambulatory Visit (INDEPENDENT_AMBULATORY_CARE_PROVIDER_SITE_OTHER): Payer: PRIVATE HEALTH INSURANCE | Admitting: Nurse Practitioner

## 2016-01-27 ENCOUNTER — Encounter: Payer: Self-pay | Admitting: Nurse Practitioner

## 2016-01-27 VITALS — BP 120/86 | Ht 66.0 in | Wt 251.0 lb

## 2016-01-27 DIAGNOSIS — E785 Hyperlipidemia, unspecified: Secondary | ICD-10-CM | POA: Diagnosis not present

## 2016-01-27 DIAGNOSIS — Z79899 Other long term (current) drug therapy: Secondary | ICD-10-CM

## 2016-01-27 DIAGNOSIS — E119 Type 2 diabetes mellitus without complications: Secondary | ICD-10-CM

## 2016-01-27 DIAGNOSIS — N950 Postmenopausal bleeding: Secondary | ICD-10-CM

## 2016-01-27 DIAGNOSIS — I10 Essential (primary) hypertension: Secondary | ICD-10-CM | POA: Diagnosis not present

## 2016-01-27 LAB — POCT GLYCOSYLATED HEMOGLOBIN (HGB A1C): Hemoglobin A1C: 5.3

## 2016-01-27 MED ORDER — PRAVASTATIN SODIUM 10 MG PO TABS: 10.0000 mg | ORAL_TABLET | Freq: Every day | ORAL | Status: DC

## 2016-01-27 NOTE — Progress Notes (Signed)
Subjective:  Presents for routine follow up. Limited activity. Doing better with diet. No CP/ischemic type pain or SOB. At end of visit, patient mentions that she has had some vaginal bleeding after almost a year of no cycles. No pelvic pain. Light bleeding, minimal clots.  Objective:   BP 120/86 mmHg  Ht 5\' 6"  (1.676 m)  Wt 251 lb (113.853 kg)  BMI 40.53 kg/m2 NAD. Alert, oriented. Lungs clear. Heart regular rate rhythm. Lower extremities no edema. Reviewed lab work dated 07/05/2015. Our goal is to get her LDL below 100. Results for orders placed or performed in visit on 01/27/16  POCT glycosylated hemoglobin (Hb A1C)  Result Value Ref Range   Hemoglobin A1C 5.3      Assessment:  Problem List Items Addressed This Visit      Cardiovascular and Mediastinum   Essential hypertension, benign   Relevant Medications   pravastatin (PRAVACHOL) 10 MG tablet   Other Relevant Orders   Lipid panel   Hepatic function panel   FSH     Endocrine   Type 2 diabetes mellitus ([place]) - Primary   Relevant Medications   pravastatin (PRAVACHOL) 10 MG tablet   Other Relevant Orders   POCT glycosylated hemoglobin (Hb A1C) (Completed)   Lipid panel   Hepatic function panel   FSH     Other   Hyperlipidemia LDL goal <100   Relevant Medications   pravastatin (PRAVACHOL) 10 MG tablet   Other Relevant Orders   Lipid panel   Hepatic function panel   FSH   Morbid obesity ([place])   Relevant Orders   Lipid panel   Hepatic function panel   FSH   Post-menopausal bleeding   Relevant Orders   Lipid panel   Hepatic function panel   FSH   US Pelvis Complete   US Transvaginal Non-OB    Other Visit Diagnoses    High risk medication use        Relevant Orders    Lipid panel    Hepatic function panel    FSH        Plan:  Meds ordered this encounter  Medications  . pravastatin (PRAVACHOL) 10 MG tablet    Sig: Take 1 tablet (10 mg total) by mouth daily.    Dispense:  30 tablet    Refill:  2    Order Specific Question:  Supervising Provider    Answer:  Mikey Kirschner [[id number]]   Continued activity and weight loss efforts. Lab work in 8-10 weeks. Schedule pelvic ultrasound to evaluate endometrial lining due to postmenopausal bleeding. Further follow-up based on test results, patient to call back sooner if any problems.

## 2016-02-01 ENCOUNTER — Ambulatory Visit (HOSPITAL_COMMUNITY)
Admission: RE | Admit: 2016-02-01 | Discharge: 2016-02-01 | Disposition: A | Discharge status: Home / Self Care | Payer: PRIVATE HEALTH INSURANCE | Source: Ambulatory Visit | Attending: Nurse Practitioner | Admitting: Nurse Practitioner

## 2016-02-01 ENCOUNTER — Ambulatory Visit (HOSPITAL_COMMUNITY): Payer: PRIVATE HEALTH INSURANCE

## 2016-02-01 DIAGNOSIS — N950 Postmenopausal bleeding: Secondary | ICD-10-CM | POA: Insufficient documentation

## 2016-02-01 DIAGNOSIS — R938 Abnormal findings on diagnostic imaging of other specified body structures: Secondary | ICD-10-CM | POA: Diagnosis not present

## 2016-02-04 ENCOUNTER — Other Ambulatory Visit: Payer: Self-pay | Admitting: *Deleted

## 2016-02-04 DIAGNOSIS — R9389 Abnormal findings on diagnostic imaging of other specified body structures: Secondary | ICD-10-CM

## 2016-02-07 ENCOUNTER — Encounter: Payer: Self-pay | Admitting: Nurse Practitioner

## 2016-02-08 LAB — HEPATIC FUNCTION PANEL
ALT: 11 IU/L (ref 0–32)
AST: 12 IU/L (ref 0–40)
Albumin: 4.1 g/dL (ref 3.5–5.5)
Alkaline Phosphatase: 78 IU/L (ref 39–117)
BILIRUBIN, DIRECT: 0.09 mg/dL (ref 0.00–0.40)
Bilirubin Total: 0.3 mg/dL (ref 0.0–1.2)
TOTAL PROTEIN: 6.9 g/dL (ref 6.0–8.5)

## 2016-02-08 LAB — FOLLICLE STIMULATING HORMONE: FSH: 20.6 m[IU]/mL

## 2016-02-08 LAB — LIPID PANEL
CHOL/HDL RATIO: 4.4 ratio (ref 0.0–4.4)
Cholesterol, Total: 181 mg/dL (ref 100–199)
HDL: 41 mg/dL (ref >39)
LDL Calculated: 103 mg/dL — ABNORMAL HIGH (ref 0–99)
TRIGLYCERIDES: 185 mg/dL — AB (ref 0–149)
VLDL CHOLESTEROL CAL: 37 mg/dL (ref 5–40)

## 2016-02-10 ENCOUNTER — Encounter: Payer: Self-pay | Admitting: Nurse Practitioner

## 2016-02-14 ENCOUNTER — Other Ambulatory Visit: Payer: Self-pay | Admitting: Nurse Practitioner

## 2016-02-14 ENCOUNTER — Encounter: Payer: PRIVATE HEALTH INSURANCE | Admitting: Obstetrics and Gynecology

## 2016-02-14 MED ORDER — MEDROXYPROGESTERONE ACETATE 5 MG PO TABS: 5.0000 mg | ORAL_TABLET | Freq: Every day | ORAL | Status: DC

## 2016-02-16 ENCOUNTER — Other Ambulatory Visit: Payer: Self-pay | Admitting: Nurse Practitioner

## 2016-02-16 MED ORDER — MEDROXYPROGESTERONE ACETATE 5 MG PO TABS: 5.0000 mg | ORAL_TABLET | Freq: Every day | ORAL | Status: DC

## 2016-03-09 ENCOUNTER — Encounter: Payer: Self-pay | Admitting: Nurse Practitioner

## 2016-05-08 ENCOUNTER — Other Ambulatory Visit: Payer: Self-pay | Admitting: Family Medicine

## 2016-05-30 ENCOUNTER — Emergency Department
Admission: EM | Admit: 2016-05-30 | Discharge: 2016-05-30 | Disposition: A | Discharge status: Home / Self Care | Payer: PRIVATE HEALTH INSURANCE | Attending: Emergency Medicine | Admitting: Emergency Medicine

## 2016-05-30 ENCOUNTER — Encounter: Payer: Self-pay | Admitting: Emergency Medicine

## 2016-05-30 ENCOUNTER — Emergency Department: Payer: PRIVATE HEALTH INSURANCE

## 2016-05-30 DIAGNOSIS — M546 Pain in thoracic spine: Secondary | ICD-10-CM | POA: Diagnosis not present

## 2016-05-30 DIAGNOSIS — R0602 Shortness of breath: Secondary | ICD-10-CM | POA: Diagnosis not present

## 2016-05-30 DIAGNOSIS — R1012 Left upper quadrant pain: Secondary | ICD-10-CM | POA: Insufficient documentation

## 2016-05-30 DIAGNOSIS — Z791 Long term (current) use of non-steroidal anti-inflammatories (NSAID): Secondary | ICD-10-CM | POA: Insufficient documentation

## 2016-05-30 DIAGNOSIS — E119 Type 2 diabetes mellitus without complications: Secondary | ICD-10-CM | POA: Diagnosis not present

## 2016-05-30 DIAGNOSIS — Z7984 Long term (current) use of oral hypoglycemic drugs: Secondary | ICD-10-CM | POA: Insufficient documentation

## 2016-05-30 DIAGNOSIS — I10 Essential (primary) hypertension: Secondary | ICD-10-CM | POA: Diagnosis not present

## 2016-05-30 DIAGNOSIS — Z79899 Other long term (current) drug therapy: Secondary | ICD-10-CM | POA: Insufficient documentation

## 2016-05-30 DIAGNOSIS — R05 Cough: Secondary | ICD-10-CM | POA: Diagnosis not present

## 2016-05-30 LAB — CBC
HEMATOCRIT: 37.6 % (ref 35.0–47.0)
HEMOGLOBIN: 13.4 g/dL (ref 12.0–16.0)
MCH: 31.4 pg (ref 26.0–34.0)
MCHC: 35.5 g/dL (ref 32.0–36.0)
MCV: 88.3 fL (ref 80.0–100.0)
Platelets: 285 10*3/uL (ref 150–440)
RBC: 4.26 MIL/uL (ref 3.80–5.20)
RDW: 13 % (ref 11.5–14.5)
WBC: 10.3 10*3/uL (ref 3.6–11.0)

## 2016-05-30 LAB — BASIC METABOLIC PANEL
Anion gap: 9 (ref 5–15)
BUN: 18 mg/dL (ref 6–20)
CO2: 25 mmol/L (ref 22–32)
CREATININE: 0.78 mg/dL (ref 0.44–1.00)
Calcium: 9.4 mg/dL (ref 8.9–10.3)
Chloride: 105 mmol/L (ref 101–111)
GFR calc Af Amer: >60 mL/min (ref >60)
GLUCOSE: 170 mg/dL — AB (ref 65–99)
Potassium: 3.9 mmol/L (ref 3.5–5.1)
Sodium: 139 mmol/L (ref 135–145)

## 2016-05-30 LAB — TROPONIN I: Troponin I: <0.03 ng/mL (ref <0.03)

## 2016-05-30 MED ORDER — SIMETHICONE 80 MG PO CHEW: 80.0000 mg | CHEWABLE_TABLET | Freq: Four times a day (QID) | ORAL | 2 refills | Status: DC | PRN

## 2016-05-30 MED ORDER — ALBUTEROL SULFATE (2.5 MG/3ML) 0.083% IN NEBU: 5.0000 mg | INHALATION_SOLUTION | Freq: Once | RESPIRATORY_TRACT | Status: DC

## 2016-05-30 NOTE — ED Provider Notes (Signed)
[facility] Emergency Department Provider Note  ____________________________________________  Time seen: Approximately 9:56 PM  I have reviewed the triage vital signs and the nursing notes.   HISTORY  Chief Complaint Back Pain    HPI JONTAVIA MOORHOUSE is a 55 y.o. female who had a sudden onset of left upper quadrant abdominal pain around 7 PM after eating dinner at a steakhouse. No vomiting diarrhea or constipation. No chest pain but she did feel somewhat short of breath like it was hard to take a good breath. Pain radiated around the 23rd are quadrant and the left upper flank. No shoulder pain. No exertional symptoms. Not pleuritic. Has had something previously similar about 2 years ago but never was evaluated for it. Symptoms have now essentially resolved on their own and she is feeling much better. Denies any previous abdominal surgeries.     Past Medical History:  Diagnosis Date  . HTN (hypertension) 09/2010  . Impaired fasting glucose 04/2006  . PCOS (polycystic ovarian syndrome)    per gyn     Patient Active Problem List   Diagnosis Date Noted  . Hyperlipidemia LDL goal <100 01/27/2016  . Post-menopausal bleeding 01/27/2016  . Anxiety as acute reaction to exceptional stress 11/07/2014  . Vitamin D deficiency 10/13/2014  . Morbid obesity ([place]) 06/03/2014  . Type 2 diabetes mellitus ([place]) 12/05/2013  . Essential hypertension, benign 07/31/2013  . 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 COLONOSCOPY N/A 12/16/2014   Procedure: COLONOSCOPY;  Surgeon: Rogene Houston, MD;  Location: [facility];  Service: Endoscopy;  Laterality: N/A;  830     Prior to Admission medications   Medication Sig Start Date End Date Taking? Authorizing Provider  chlorzoxazone (PARAFON) 500 MG tablet One po qhs prn muscle spasms 05/27/14   Nilda Simmer, NP  cholecalciferol (VITAMIN D) 1000 UNITS tablet Take 1,000  Units by mouth daily.    Historical Provider, MD  escitalopram (LEXAPRO) 20 MG tablet Take 1 tablet (20 mg total) by mouth daily. 09/27/15 09/26/16  Nilda Simmer, NP  fluconazole (DIFLUCAN) 150 MG tablet One po qd prn yeast infection; may repeat in 3-4 days if needed 12/04/15   Nilda Simmer, NP  fluticasone [facility]) 50 MCG/ACT nasal spray Place 2 sprays into both nostrils daily. 04/28/14   Nilda Simmer, NP  losartan (COZAAR) 50 MG tablet TAKE 1 TABLET BY MOUTH EVERY MORNING 01/24/16   Mikey Kirschner, MD  medroxyPROGESTERone (PROVERA) 5 MG tablet Take 1 tablet (5 mg total) by mouth daily. 02/16/16   Nilda Simmer, NP  metFORMIN (GLUCOPHAGE) 1000 MG tablet TAKE 1 TABLET BY MOUTH TWICE DAILY WITH A MEAL. 12/17/15   Nilda Simmer, NP  naproxen (NAPROSYN) 500 MG tablet Take 1 tablet (500 mg total) by mouth 2 (two) times daily with a meal. 01/04/15   Nilda Simmer, NP  pravastatin (PRAVACHOL) 10 MG tablet Take 1 tablet (10 mg total) by mouth daily. 01/27/16   Nilda Simmer, NP  ranitidine (ZANTAC) 300 MG tablet TAKE 1 TABLET BY MOUTH AT BEDTIME. 10/26/15   Mikey Kirschner, MD  simethicone (GAS-X) 80 MG chewable tablet Chew 1 tablet (80 mg total) by mouth 4 (four) times daily as needed for flatulence. 05/30/16 05/30/17  Carrie Mew, MD  TANZEUM 50 MG PEN INJECT SUBCUTANEOUSLY ONCE A WEEK 05/08/16   Nilda Simmer, NP  TRUETEST TEST test strip USE ONCE  EVERY DAY AS DIRECTED - 50 DAY SUPPLY 03/16/15   Kathyrn Drown, MD     Allergies Zithromax [azithromycin]   Family History  Problem Relation Age of Onset  . Hypertension Mother   . Diabetes Mother   . Hypertension Father   . Hyperlipidemia Father   . Cancer Paternal Grandmother 36    colon    Social History Social History  Substance Use Topics  . Smoking status: Never Smoker  . Smokeless tobacco: Never Used  . Alcohol use Not on file    Review of Systems  Constitutional:   No fever or chills.    Cardiovascular:   No chest pain. Respiratory:   Positive shortness of breath without cough. Gastrointestinal:   Positive left upper quadrant abdominal pain without vomiting or diarrhea.  Genitourinary:   Negative for dysuria or difficulty urinating. Musculoskeletal:   Negative for focal pain or swelling  10-point ROS otherwise negative.  ____________________________________________   PHYSICAL EXAM:  VITAL SIGNS: ED Triage Vitals [05/30/16 1955]  Enc Vitals Group     BP (!) 159/93     Pulse Rate 89     Resp 15     Temp 98.1 F (36.7 C)     Temp Source Oral     SpO2 97 %     Weight 240 lb (108.9 kg)     Height 5\' 7"  (1.702 m)     Head Circumference      Peak Flow      Pain Score 8     Pain Loc      Pain Edu?      Excl. in [place]?     Vital signs reviewed, nursing assessments reviewed.   Constitutional:   Alert and oriented. Well appearing and in no distress. Eyes:   No scleral icterus. No conjunctival pallor. PERRL. EOMI.  No nystagmus. ENT   Head:   Normocephalic and atraumatic.   Nose:   No congestion/rhinnorhea. No septal hematoma   Mouth/Throat:   MMM, no pharyngeal erythema. No peritonsillar mass.    Neck:   No stridor. No SubQ emphysema. No meningismus. Hematological/Lymphatic/Immunilogical:   No cervical lymphadenopathy. Cardiovascular:   RRR. Symmetric bilateral radial and DP pulses.  No murmurs.  Respiratory:   Normal respiratory effort without tachypnea nor retractions. Breath sounds are clear and equal bilaterally. No wheezes/rales/rhonchi.Chest wall nontender Gastrointestinal:   Soft and nontender. Non distended. There is no CVA tenderness.  No rebound, rigidity, or guarding. Genitourinary:   deferred Musculoskeletal:   Nontender with normal range of motion in all extremities. No joint effusions.  No lower extremity tenderness.  No edema. Neurologic:   Normal speech and language.  CN 2-10 normal. Motor grossly intact. No gross focal  neurologic deficits are appreciated.  Skin:    Skin is warm, dry and intact. No rash noted.  No petechiae, purpura, or bullae.  ____________________________________________    LABS (pertinent positives/negatives) (all labs ordered are listed, but only abnormal results are displayed) Labs Reviewed  BASIC METABOLIC PANEL - Abnormal; Notable for the following:       Result Value   Glucose, Bld 170 (*)    All other components within normal limits  CBC  TROPONIN I   ____________________________________________   EKG  Interpreted by me Normal sinus rhythm rate of 89, normal axis intervals. Poor R-wave progression in anterior precordial leads. Normal ST segments and T waves. Voltage criteria for LVH in the high lateral leads.  ____________________________________________    [id number]  Chest x-ray unremarkable  ____________________________________________   PROCEDURES Procedures  ____________________________________________   INITIAL IMPRESSION / ASSESSMENT AND PLAN / ED COURSE  Pertinent labs & imaging results that were available during my care of the patient were reviewed by me and considered in my medical decision making (see chart for details).  Patient well appearing no acute distress, presents with colicky left upper quadrant abdominal pain, now resolved. Exam is completely benign. Very low suspicion for any cardiopulmonary or intrathoracic pathology. Not vascular. Abdomen is benign. Low suspicion for biliary pathology, perforation obstruction diverticulitis or kidney stone. I suspect that this was bowel gas causing some momentary distention. I'll have the patient try simethicone and follow up with primary care.     Clinical Course   ____________________________________________   FINAL CLINICAL IMPRESSION(S) / ED DIAGNOSES  Final diagnoses:  Left upper quadrant pain       Portions of this note were generated with dragon dictation software. Dictation  errors may occur despite best attempts at proofreading.    Carrie Mew, MD 05/30/16 [phone]

## 2016-05-30 NOTE — ED Triage Notes (Signed)
Pt ambulatory to triage with steady gait, no distress noted. Pt c/o of thoracic back pain that radiates around to rib cage area on the left side. Pt reports SOB, denies chest pain. Pt sts she has had this happen before 2 years ago and was never diagnosed or seen.

## 2016-06-05 ENCOUNTER — Ambulatory Visit (INDEPENDENT_AMBULATORY_CARE_PROVIDER_SITE_OTHER): Payer: PRIVATE HEALTH INSURANCE | Admitting: Nurse Practitioner

## 2016-06-05 ENCOUNTER — Other Ambulatory Visit: Payer: Self-pay | Admitting: Nurse Practitioner

## 2016-06-05 ENCOUNTER — Encounter: Payer: Self-pay | Admitting: Nurse Practitioner

## 2016-06-05 VITALS — BP 138/94 | Ht 65.0 in | Wt 260.0 lb

## 2016-06-05 DIAGNOSIS — E119 Type 2 diabetes mellitus without complications: Secondary | ICD-10-CM

## 2016-06-05 DIAGNOSIS — Z1231 Encounter for screening mammogram for malignant neoplasm of breast: Secondary | ICD-10-CM

## 2016-06-05 DIAGNOSIS — J329 Chronic sinusitis, unspecified: Secondary | ICD-10-CM | POA: Diagnosis not present

## 2016-06-05 DIAGNOSIS — Z0000 Encounter for general adult medical examination without abnormal findings: Secondary | ICD-10-CM | POA: Diagnosis not present

## 2016-06-05 DIAGNOSIS — J310 Chronic rhinitis: Secondary | ICD-10-CM

## 2016-06-05 MED ORDER — AMOXICILLIN-POT CLAVULANATE 875-125 MG PO TABS
1.0000 | ORAL_TABLET | Freq: Two times a day (BID) | ORAL | 0 refills | Status: DC
Start: 2016-06-05 — End: 2016-09-20

## 2016-06-05 NOTE — Patient Instructions (Signed)
bydureon  Trulicity

## 2016-06-05 NOTE — Progress Notes (Signed)
Subjective:    Patient ID: Georgeann Oppenheim, female    DOB: 1961/02/13, 55 y.o.   MRN: [id number]  HPI presents for her wellness exam. Married, same sexual partner. Currently on Provera daily see previous notes. No vaginal bleeding. No pelvic pain. Regular vision and dental exams. Taking vitamin D 2000 units per day. Has been trying to increase her activity. Has done well with her weight loss, being followed by the diabetes clinic. Has had some right frontal sinus pressure with slight drainage from the right eye, mainly in the mornings. No eye pain. No visual changes. No fever.    Review of Systems  Constitutional: Negative for activity change, appetite change and fatigue.  HENT: Positive for congestion, postnasal drip and sinus pressure. Negative for dental problem, ear pain and sore throat.   Eyes: Positive for discharge.  Respiratory: Negative for cough, chest tightness, shortness of breath and wheezing.   Cardiovascular: Negative for chest pain.  Gastrointestinal: Negative for abdominal distention, abdominal pain, blood in stool, constipation, diarrhea, nausea and vomiting.  Genitourinary: Negative for difficulty urinating, dysuria, enuresis, frequency, genital sores, menstrual problem, pelvic pain, urgency, vaginal bleeding and vaginal discharge.       Objective:   Physical Exam  Constitutional: She is oriented to person, place, and time. She appears well-developed. No distress.  HENT:  Right Ear: External ear normal.  Left Ear: External ear normal.  Mouth/Throat: Oropharynx is clear and moist.  Pharynx nonerythematous with green PND noted. TMs clear effusion, no erythema.   Neck: Normal range of motion. Neck supple. No tracheal deviation present. No thyromegaly present.  Cardiovascular: Normal rate, regular rhythm and normal heart sounds.  Exam reveals no gallop.   No murmur heard. Pulmonary/Chest: Effort normal and breath sounds normal.  Abdominal: Soft. She exhibits no  distension. There is no tenderness.  Genitourinary: Vagina normal and uterus normal. No vaginal discharge found.  Genitourinary Comments: External GU no rashes or lesions. Vagina no discharge. Cervix normal limit in appearance. Bimanual exam no obvious masses, no tenderness. Exam limited due to abdominal girth. Rectal exam no masses, no stool for Hemoccult.  Musculoskeletal: She exhibits no edema.  Lymphadenopathy:    She has no cervical adenopathy.  Neurological: She is alert and oriented to person, place, and time.  Skin: Skin is warm and dry. No rash noted.  Psychiatric: She has a normal mood and affect. Her behavior is normal.  Vitals reviewed.  Breast exam: Areas of dense tissue, no masses. Minimal fine nodularity. Axillae no adenopathy.        Assessment & Plan:   Problem List Items Addressed This Visit      Endocrine   Type 2 diabetes mellitus ([place])   Relevant Orders   Microalbumin / creatinine urine ratio    Other Visit Diagnoses    Routine general medical examination at a health care facility    -  Primary   Relevant Orders   POC Hemoccult Bld/Stl (3-Cd Home Screen)   Encounter for screening mammogram for breast cancer       Relevant Orders   MM DIGITAL SCREENING BILATERAL   Rhinosinusitis       Relevant Medications   amoxicillin-clavulanate (AUGMENTIN) 875-125 MG tablet     Meds ordered this encounter  Medications  . amoxicillin-clavulanate (AUGMENTIN) 875-125 MG tablet    Sig: Take 1 tablet by mouth 2 (two) times daily.    Dispense:  20 tablet    Refill:  0    Order Specific  Question:   Supervising Provider    Answer:   Mikey Kirschner [[id number]]   OTC meds as directed for congestion. Call back if worsens or persists. Encouraged continued healthy diet and weight loss. Continue follow-up with diabetes clinic. Return in about 4 months (around 10/06/2016) for recheck.

## 2016-06-06 LAB — MICROALBUMIN / CREATININE URINE RATIO
Creatinine, Urine: 125.9 mg/dL
MICROALB/CREAT RATIO: 6.7 mg/g{creat} (ref 0.0–30.0)
MICROALBUM., U, RANDOM: 8.4 ug/mL

## 2016-06-14 ENCOUNTER — Inpatient Hospital Stay (HOSPITAL_COMMUNITY): Admission: RE | Admit: 2016-06-14 | Payer: PRIVATE HEALTH INSURANCE | Source: Ambulatory Visit

## 2016-06-14 ENCOUNTER — Ambulatory Visit (HOSPITAL_COMMUNITY)
Admission: RE | Admit: 2016-06-14 | Discharge: 2016-06-14 | Disposition: A | Discharge status: Home / Self Care | Payer: PRIVATE HEALTH INSURANCE | Source: Ambulatory Visit | Attending: Nurse Practitioner | Admitting: Nurse Practitioner

## 2016-06-14 ENCOUNTER — Other Ambulatory Visit: Payer: Self-pay | Admitting: Nurse Practitioner

## 2016-06-14 DIAGNOSIS — Z1231 Encounter for screening mammogram for malignant neoplasm of breast: Secondary | ICD-10-CM | POA: Diagnosis not present

## 2016-06-29 ENCOUNTER — Encounter: Payer: Self-pay | Admitting: Nurse Practitioner

## 2016-07-06 ENCOUNTER — Other Ambulatory Visit: Payer: Self-pay | Admitting: Family Medicine

## 2016-07-06 ENCOUNTER — Other Ambulatory Visit: Payer: Self-pay | Admitting: Nurse Practitioner

## 2016-08-08 ENCOUNTER — Other Ambulatory Visit: Payer: Self-pay | Admitting: Nurse Practitioner

## 2016-08-28 LAB — HM DIABETES EYE EXAM

## 2016-09-19 ENCOUNTER — Encounter: Payer: Self-pay | Admitting: Nurse Practitioner

## 2016-09-20 ENCOUNTER — Ambulatory Visit (INDEPENDENT_AMBULATORY_CARE_PROVIDER_SITE_OTHER): Payer: PRIVATE HEALTH INSURANCE | Admitting: Family Medicine

## 2016-09-20 ENCOUNTER — Encounter: Payer: Self-pay | Admitting: Family Medicine

## 2016-09-20 VITALS — BP 140/80 | Temp 98.6°F | Ht 65.0 in | Wt 257.0 lb

## 2016-09-20 DIAGNOSIS — J329 Chronic sinusitis, unspecified: Secondary | ICD-10-CM | POA: Diagnosis not present

## 2016-09-20 DIAGNOSIS — J029 Acute pharyngitis, unspecified: Secondary | ICD-10-CM

## 2016-09-20 DIAGNOSIS — J310 Chronic rhinitis: Secondary | ICD-10-CM

## 2016-09-20 LAB — POCT RAPID STREP A (OFFICE): Rapid Strep A Screen: NEGATIVE

## 2016-09-20 MED ORDER — BENZONATATE 100 MG PO CAPS: 100.0000 mg | ORAL_CAPSULE | Freq: Four times a day (QID) | ORAL | 0 refills | Status: DC | PRN

## 2016-09-20 MED ORDER — AMOXICILLIN-POT CLAVULANATE 875-125 MG PO TABS: 1.0000 | ORAL_TABLET | Freq: Two times a day (BID) | ORAL | 0 refills | Status: AC

## 2016-09-20 NOTE — Progress Notes (Signed)
   Subjective:    Patient ID: Susan Becker, female    DOB: April 10, 1961, 56 y.o.   MRN: [id number]  Sore Throat   This is a new problem. The current episode started in the past 7 days. The problem has been unchanged. Neither side of throat is experiencing more pain than the other. There has been no fever. The pain is moderate. Associated symptoms include congestion, coughing and ear pain. Treatments tried: sinus med. The treatment provided no relief.   Results for orders placed or performed in visit on 09/20/16  POCT rapid strep A  Result Value Ref Range   Rapid Strep A Screen Negative Negative   mon scratch y throat   Glands swollen and raw   Pos proc cough and congestdd  n sig fever    yest cough kicked in  No achey ness in muscles  And joints  Tried sudafed and  sch   yest ne point had bad headache both eyes  Non smoker     Review of Systems  HENT: Positive for congestion and ear pain.   Respiratory: Positive for cough.        Objective:   Physical Exam  Alert mild malaise positive nasal discharge pharynx erythematous neck supple lungs bronchial cough heart regular rate and rhythm.      Assessment & Plan:  Impression post viral rhinosinusitis/bronchitis plan antibiotics prescribed. Symptom care discussed warning signs discussed WSL

## 2016-09-21 LAB — STREP A DNA PROBE: Strep Gp A Direct, DNA Probe: NEGATIVE

## 2016-09-28 ENCOUNTER — Ambulatory Visit (INDEPENDENT_AMBULATORY_CARE_PROVIDER_SITE_OTHER): Payer: PRIVATE HEALTH INSURANCE | Admitting: Family Medicine

## 2016-09-28 ENCOUNTER — Encounter: Payer: Self-pay | Admitting: Family Medicine

## 2016-09-28 VITALS — Temp 98.5°F | Wt 260.4 lb

## 2016-09-28 DIAGNOSIS — J4521 Mild intermittent asthma with (acute) exacerbation: Secondary | ICD-10-CM | POA: Diagnosis not present

## 2016-09-28 MED ORDER — PREDNISONE 20 MG PO TABS: ORAL_TABLET | ORAL | 0 refills | Status: DC

## 2016-09-28 MED ORDER — ALBUTEROL SULFATE HFA 108 (90 BASE) MCG/ACT IN AERS: 2.0000 | INHALATION_SPRAY | Freq: Four times a day (QID) | RESPIRATORY_TRACT | 2 refills | Status: DC | PRN

## 2016-09-28 NOTE — Progress Notes (Signed)
   Subjective:    Patient ID: Georgeann Oppenheim, female    DOB: 03-Jan-1961, 56 y.o.   MRN: [id number]  Cough  This is a new problem. The current episode started in the past 7 days. Associated symptoms include wheezing. Treatments tried: Augmentin, Tessalon Perles. The treatment provided no relief. Augmentin    Patient seen last week and diagnosed with the flu.  It was felt she had the flu was secondary sinus infection she was treated with antibiotics over the course of the past several days she is had an onset of tight off she denies sweats or chills. She is still taking Augmentin.  Review of Systems  Respiratory: Positive for cough and wheezing.   Patient denies high fevers body aches sweats chills vomiting or diarrhea     Objective:   Physical Exam Bronchial cough noted not respiratory distress eardrums normal throat normal no rash   Patient was encouraged to keep [place] updated that if she has progressive troubles fevers chills or persistent illness she needs to let us know this will probably take some time for it to ease up    Assessment & Plan:  Asthmatic bronchitis Secondary to the flu Prednisone taper Albuterol on a frequent basis Recheck if progressive troubles Warning signs regarding the flu was discussed in detail

## 2016-09-29 ENCOUNTER — Encounter: Payer: Self-pay | Admitting: *Deleted

## 2016-10-04 ENCOUNTER — Encounter: Payer: Self-pay | Admitting: Nurse Practitioner

## 2016-10-04 ENCOUNTER — Ambulatory Visit (INDEPENDENT_AMBULATORY_CARE_PROVIDER_SITE_OTHER): Payer: PRIVATE HEALTH INSURANCE | Admitting: Nurse Practitioner

## 2016-10-04 VITALS — BP 136/80 | Ht 65.0 in | Wt 261.1 lb

## 2016-10-04 DIAGNOSIS — E119 Type 2 diabetes mellitus without complications: Secondary | ICD-10-CM | POA: Diagnosis not present

## 2016-10-04 DIAGNOSIS — I10 Essential (primary) hypertension: Secondary | ICD-10-CM

## 2016-10-04 DIAGNOSIS — E785 Hyperlipidemia, unspecified: Secondary | ICD-10-CM

## 2016-10-04 LAB — POCT GLYCOSYLATED HEMOGLOBIN (HGB A1C): Hemoglobin A1C: 5.9

## 2016-10-04 MED ORDER — FLUCONAZOLE 150 MG PO TABS: ORAL_TABLET | ORAL | 0 refills | Status: DC

## 2016-10-04 NOTE — Progress Notes (Signed)
Subjective:  Presents for recheck on her diabetes. Has not done well with her diet lately. Last time she checked sugar at home was in October; numbers 105-115 fasting. Limited activity. No CP/ischemic type pain or SOB. Wants labs ordered but may get them done free through work this spring.   Objective:   BP 136/80   Ht 5\' 5"  (1.651 m)   Wt 261 lb 2 oz (118.4 kg)   BMI 43.45 kg/m  NAD. Alert, oriented. Lungs clear.heart RRR. Weight stable since October.  Results for orders placed or performed in visit on 10/04/16  POCT glycosylated hemoglobin (Hb A1C)  Result Value Ref Range   Hemoglobin A1C 5.9      Assessment:   Problem List Items Addressed This Visit      Cardiovascular and Mediastinum   Essential hypertension, benign   Relevant Orders   Hepatic function panel     Other   Hyperlipidemia LDL goal <100   Relevant Orders   Lipid panel   Morbid obesity ([place])    Other Visit Diagnoses    Diabetes mellitus without complication ([place])    -  Primary   Relevant Orders   POCT glycosylated hemoglobin (Hb A1C) (Completed)       Plan:   Meds ordered this encounter  Medications  . fluconazole (DIFLUCAN) 150 MG tablet    Sig: One po qd prn yeast infection; may repeat in 3-4 days if needed    Dispense:  2 tablet    Refill:  0    Order Specific Question:   Supervising Provider    Answer:   Mikey Kirschner [[id number]]   Encouraged regular walking, healthy diet and weight loss goal of 10 lbs. Discussed options to help with weight loss.  Return in about 4 months (around 02/01/2017) for diabetes check up. Bring lab results from work with her at that time.

## 2016-10-06 ENCOUNTER — Ambulatory Visit: Payer: PRIVATE HEALTH INSURANCE | Admitting: Nurse Practitioner

## 2016-12-19 ENCOUNTER — Encounter: Payer: Self-pay | Admitting: Nurse Practitioner

## 2016-12-25 ENCOUNTER — Ambulatory Visit (INDEPENDENT_AMBULATORY_CARE_PROVIDER_SITE_OTHER): Payer: PRIVATE HEALTH INSURANCE | Admitting: Family Medicine

## 2016-12-25 ENCOUNTER — Encounter: Payer: Self-pay | Admitting: Family Medicine

## 2016-12-25 VITALS — BP 126/80 | Temp 98.9°F | Ht 65.0 in | Wt 268.1 lb

## 2016-12-25 DIAGNOSIS — R61 Generalized hyperhidrosis: Secondary | ICD-10-CM | POA: Diagnosis not present

## 2016-12-25 DIAGNOSIS — R221 Localized swelling, mass and lump, neck: Secondary | ICD-10-CM | POA: Diagnosis not present

## 2016-12-25 MED ORDER — ESCITALOPRAM OXALATE 10 MG PO TABS: 10.0000 mg | ORAL_TABLET | Freq: Every day | ORAL | 4 refills | Status: DC

## 2016-12-25 NOTE — Progress Notes (Signed)
   Subjective:    Patient ID: Susan Becker, female    DOB: 09-Oct-1960, 56 y.o.   MRN: [id number]  HPI Patient in today for swelling of the back of neck.   This patient has noted that she is having some swelling in the base of her head /neck where her head comes into contact with the neck She denies any injury denies any radiation down the arms. Denies any shortness of breath. States no other concerns   Review of Systems    denies chest tightness pressure pain shortness breath fever chills sweats Objective:   Physical Exam Lungs are clear hearts regular pulse normal neck no masses felt she has thickened skin and subcutaneous tissue on the back of her neck it is hard to discern if there is a cyst present or not. I suspect that this is mainly just her own soft tissue causing this issue-I do not feel there is anything pathologic.  The patient also relates that she breaks out in sweats with minimal activity she denies feeling short of breath or chest pain she does state she sweats a lot she is at age where she can have problems with perimenopause. She also is on Lexapro which can also cause sweating  She states she was originally on Lexapro after loosing apparent but she also states that she is been doing well for many months she has been on the medicine for over a year     Assessment & Plan:  Excessive sweating possibly perimenopausal possibly related to Lexapro she may reduce the Lexapro to 10 mg then if the patient decides to she can taper off of the Lexapro if she has recurrence of depression symptoms she will need to restart medicine she should give [place] an update how things are going  Neck swelling-more than likely not pathologic but may need ultrasound of the back of the neck to rule out cyst, I do not feel a cyst but this appears to be thickened tissue in the back of the neck could be related to her weight.  Addendum 01/19/2017-I did discuss the case with radiology regarding the back of  the neck and the best way to look at this. Ultrasound would be the initial tests. If this was abnormal then may need MRI.

## 2017-01-07 ENCOUNTER — Other Ambulatory Visit: Payer: Self-pay | Admitting: Nurse Practitioner

## 2017-01-23 ENCOUNTER — Telehealth: Payer: Self-pay | Admitting: *Deleted

## 2017-01-23 NOTE — Progress Notes (Signed)
Please let the patient know that Dr Nicki Reaper did discuss her case with radiology specialists. They stated that if the patient was concerned about the possibility of growth in the back of her neck in the soft tissue the first test would be a ultrasound. The patient can either go ahead and have [place] schedule a soft tissue ultrasound for the back of the neck or she can watch this area in follow-up in a few months for recheck. Patient verbalized understanding and stated that she will hold on the ultrasound for now since the area has gotten smaller.

## 2017-01-23 NOTE — Telephone Encounter (Signed)
Please let the patient know that Dr Nicki Reaper did discuss her case with radiology specialists. They stated that if the patient was concerned about the possibility of growth in the back of her neck in the soft tissue the first test would be a ultrasound. The patient can either go ahead and have [place] schedule a soft tissue ultrasound for the back of the neck or she can watch this area in follow-up in a few months for recheck. Patient verbalized understanding and stated that she will hold on the ultrasound for now since the area has gotten smaller.

## 2017-01-31 ENCOUNTER — Ambulatory Visit: Payer: PRIVATE HEALTH INSURANCE | Admitting: Nurse Practitioner

## 2017-02-02 ENCOUNTER — Encounter: Payer: Self-pay | Admitting: Nurse Practitioner

## 2017-02-02 ENCOUNTER — Ambulatory Visit (INDEPENDENT_AMBULATORY_CARE_PROVIDER_SITE_OTHER): Payer: PRIVATE HEALTH INSURANCE | Admitting: Nurse Practitioner

## 2017-02-02 ENCOUNTER — Other Ambulatory Visit: Payer: Self-pay | Admitting: *Deleted

## 2017-02-02 ENCOUNTER — Encounter: Payer: Self-pay | Admitting: Family Medicine

## 2017-02-02 VITALS — BP 164/100 | Ht 65.0 in | Wt 265.8 lb

## 2017-02-02 DIAGNOSIS — E559 Vitamin D deficiency, unspecified: Secondary | ICD-10-CM | POA: Diagnosis not present

## 2017-02-02 DIAGNOSIS — R0683 Snoring: Secondary | ICD-10-CM | POA: Diagnosis not present

## 2017-02-02 DIAGNOSIS — R5383 Other fatigue: Secondary | ICD-10-CM | POA: Diagnosis not present

## 2017-02-02 DIAGNOSIS — I10 Essential (primary) hypertension: Secondary | ICD-10-CM

## 2017-02-02 DIAGNOSIS — E119 Type 2 diabetes mellitus without complications: Secondary | ICD-10-CM | POA: Diagnosis not present

## 2017-02-02 LAB — POCT GLYCOSYLATED HEMOGLOBIN (HGB A1C): Hemoglobin A1C: 6.5

## 2017-02-02 MED ORDER — BLOOD GLUCOSE MONITOR KIT: PACK | 0 refills | Status: DC

## 2017-02-02 MED ORDER — LOSARTAN POTASSIUM 50 MG PO TABS: ORAL_TABLET | ORAL | 5 refills | Status: DC

## 2017-02-02 NOTE — Progress Notes (Signed)
Subjective:  Presents for recheck on her diabetes and hypertension. Compliant with medications. Took her blood pressure pill this morning. No chest pain/ischemic type pain or shortness of breath. No edema. Some activity. Would like to get dietary counseling to help plan her meals. Also complaining of extreme fatigue. States her husband cannot sleep in the same room due to loud snoring. Has not been tested for sleep apnea. See [employer].  Objective:   BP (!) 164/100   Ht 5\' 5"  (1.651 m)   Wt 265 lb 12.8 oz (120.6 kg)   BMI 44.23 kg/m  NAD. Alert, oriented. Thyroid no mass or goiter; no tenderness. Lungs clear. Heart regular rate rhythm. Lower extremities no edema. Patient has brought lab work and from her employer. Does not include thyroid her vitamin D testing. Her A1c had gotten up to 7.7 at one point. See scanned report. Results for orders placed or performed in visit on 02/02/17  POCT glycosylated hemoglobin (Hb A1C)  Result Value Ref Range   Hemoglobin A1C 6.5      Assessment:   Problem List Items Addressed This Visit      Cardiovascular and Mediastinum   Essential hypertension, benign   Relevant Medications   aspirin 81 MG chewable tablet   losartan (COZAAR) 50 MG tablet     Endocrine   Diabetes mellitus without complication ([facility]) - Primary   Relevant Medications   aspirin 81 MG chewable tablet   losartan (COZAAR) 50 MG tablet   Other Relevant Orders   POCT glycosylated hemoglobin (Hb A1C) (Completed)   Ambulatory referral to diabetic education     Other   Vitamin D deficiency   Relevant Orders   Vitamin D 1,25 dihydroxy    Other Visit Diagnoses    Fatigue, unspecified type       Relevant Orders   TSH   T4, free   Ambulatory referral to Sleep Studies   Snoring       Relevant Orders   Ambulatory referral to Sleep Studies         Plan:   Meds ordered this encounter  Medications  . aspirin 81 MG chewable tablet    Sig: Chew by mouth daily.  Marland Kitchen  losartan (COZAAR) 50 MG tablet    Sig: Increase to 2 tabs po qd    Dispense:  30 tablet    Refill:  5    Generic For:*COZAAR    50MG     Order Specific Question:   Supervising Provider    Answer:   Maggie Font   Refer for sleep study and diabetes education with dietitian. Increase losartan to 100 mg a day and monitor BP outside the office. Send results to my chart. Given prescription for glucose testing machine and supplies.  Return in about 3 months (around 05/05/2017) for recheck.

## 2017-02-03 ENCOUNTER — Other Ambulatory Visit: Payer: Self-pay | Admitting: Nurse Practitioner

## 2017-02-06 LAB — TSH: TSH: 3.06 u[IU]/mL (ref 0.450–4.500)

## 2017-02-06 LAB — VITAMIN D 1,25 DIHYDROXY
VITAMIN D 1, 25 (OH) TOTAL: 73 pg/mL — AB
Vitamin D3 1, 25 (OH)2: 73 pg/mL

## 2017-02-06 LAB — T4, FREE: FREE T4: 1.16 ng/dL (ref 0.82–1.77)

## 2017-02-07 ENCOUNTER — Telehealth: Payer: Self-pay | Admitting: Nurse Practitioner

## 2017-02-07 NOTE — Telephone Encounter (Signed)
Signed off today.

## 2017-02-07 NOTE — Telephone Encounter (Signed)
Requesting results to recent lab results.

## 2017-02-15 ENCOUNTER — Ambulatory Visit: Payer: PRIVATE HEALTH INSURANCE | Attending: Nurse Practitioner | Admitting: Neurology

## 2017-02-15 DIAGNOSIS — R5383 Other fatigue: Secondary | ICD-10-CM | POA: Insufficient documentation

## 2017-02-15 DIAGNOSIS — Z7984 Long term (current) use of oral hypoglycemic drugs: Secondary | ICD-10-CM | POA: Diagnosis not present

## 2017-02-15 DIAGNOSIS — Z79899 Other long term (current) drug therapy: Secondary | ICD-10-CM | POA: Insufficient documentation

## 2017-02-15 DIAGNOSIS — G4733 Obstructive sleep apnea (adult) (pediatric): Secondary | ICD-10-CM | POA: Insufficient documentation

## 2017-02-15 DIAGNOSIS — Z7982 Long term (current) use of aspirin: Secondary | ICD-10-CM | POA: Diagnosis not present

## 2017-02-15 DIAGNOSIS — R0683 Snoring: Secondary | ICD-10-CM | POA: Diagnosis not present

## 2017-02-15 DIAGNOSIS — G4710 Hypersomnia, unspecified: Secondary | ICD-10-CM

## 2017-02-17 NOTE — Procedures (Signed)
Marceline A. Merlene Laughter, MD     [email or web address]             NOCTURNAL POLYSOMNOGRAPHY   LOCATION: [facility]   Patient Name: Susan Becker, Susan Becker Date: 02/15/2017 Gender: Female D.O.B: 1961/01/08 Age (years): 55 Referring Provider: Sallee Lange Height (inches): 41 Interpreting Physician: Phillips Odor MD, ABSM Weight (lbs): 265 RPSGT: Peak, Robert BMI: 44 MRN: [id number] Neck Size: CLINICAL INFORMATION Sleep Study Type: HST  Indication for sleep study: Daytime Fatigue, Fatigue, Snoring  Epworth Sleepiness Score: NA  SLEEP STUDY TECHNIQUE A multi-channel overnight portable sleep study was performed. The channels recorded were: nasal airflow, thoracic respiratory movement, and oxygen saturation with a pulse oximetry. Snoring was also monitored.  MEDICATIONS Patient self administered medications include: N/A.  Current Outpatient Prescriptions:  .  albuterol (PROVENTIL HFA;VENTOLIN HFA) 108 (90 Base) MCG/ACT inhaler, Inhale 2 puffs into the lungs every 6 (six) hours as needed for wheezing. (Patient not taking: Reported on 12/25/2016), Disp: 1 Inhaler, Rfl: 2 .  aspirin 81 MG chewable tablet, Chew by mouth daily., Disp: , Rfl:  .  blood glucose meter kit and supplies KIT, Dispense based on patient and insurance preference. Use to check blood sugar daily. ICD 10 code E11.9, Disp: 1 each, Rfl: 0 .  chlorzoxazone (PARAFON) 500 MG tablet, One po qhs prn muscle spasms (Patient not taking: Reported on 12/25/2016), Disp: 30 tablet, Rfl: 0 .  cholecalciferol (VITAMIN D) 1000 UNITS tablet, Take 1,000 Units by mouth daily., Disp: , Rfl:  .  escitalopram (LEXAPRO) 10 MG tablet, Take 1 tablet (10 mg total) by mouth daily., Disp: 30 tablet, Rfl: 4 .  fluticasone (FLONASE) 50 MCG/ACT nasal spray, Place 2 sprays into both nostrils daily. (Patient not taking: Reported on 02/02/2017), Disp: 16 g, Rfl: 5 .  losartan (COZAAR) 50 MG tablet, Increase to 2 tabs po qd, Disp: 30 tablet,  Rfl: 5 .  medroxyPROGESTERone (PROVERA) 5 MG tablet, TAKE 1 TABLET BY MOUTH DAILY., Disp: 30 tablet, Rfl: 1 .  metFORMIN (GLUCOPHAGE) 1000 MG tablet, TAKE 1 TABLET BY MOUTH TWICE DAILY WITH A MEAL., Disp: 60 tablet, Rfl: 2 .  pravastatin (PRAVACHOL) 10 MG tablet, Take 1 tablet (10 mg total) by mouth daily., Disp: 30 tablet, Rfl: 2 .  ranitidine (ZANTAC) 300 MG tablet, TAKE 1 TABLET BY MOUTH AT BEDTIME., Disp: 30 tablet, Rfl: 5 .  simethicone (GAS-X) 80 MG chewable tablet, Chew 1 tablet (80 mg total) by mouth 4 (four) times daily as needed for flatulence. (Patient not taking: Reported on 02/02/2017), Disp: 100 tablet, Rfl: 2 .  TANZEUM 50 MG PEN, INJECT SUBCUTANEOUSLY ONCE A WEEK AS DIRECTED, Disp: 4 each, Rfl: 5 .  TRUETEST TEST test strip, USE ONCE EVERY DAY AS DIRECTED - 50 DAY SUPPLY, Disp: 50 each, Rfl: 5  SLEEP ARCHITECTURE Patient was studied for 348.0 minutes. The sleep efficiency was 84.7 % and the patient was supine for 33.4%. The arousal index was 0.0 per hour.  RESPIRATORY PARAMETERS The overall AHI was 41.9 per hour, with a central apnea index of 2.2 per hour.  The oxygen nadir was 85% during sleep.  CARDIAC DATA Mean heart rate during sleep was 75.7 bpm.  IMPRESSIONS - Severe obstructive sleep apnea occurred during this study (AHI = 41.9/h). A formal CPAP titration study is suggested.    Delano Metz, MD Diplomate, American Board of Sleep Medicine.   ELECTRONICALLY SIGNED ON:  02/17/2017, 3:32 PM [place] PH: [phone]   FX: [phone] ACCREDITED BY THE AMERICAN ACADEMY OF SLEEP MEDICINE  

## 2017-02-19 ENCOUNTER — Other Ambulatory Visit: Payer: Self-pay | Admitting: Nurse Practitioner

## 2017-02-19 ENCOUNTER — Encounter: Payer: Self-pay | Admitting: Nurse Practitioner

## 2017-02-19 MED ORDER — LOSARTAN POTASSIUM 100 MG PO TABS: 100.0000 mg | ORAL_TABLET | Freq: Every day | ORAL | 5 refills | Status: DC

## 2017-02-22 ENCOUNTER — Telehealth: Payer: Self-pay | Admitting: Family Medicine

## 2017-02-22 NOTE — Telephone Encounter (Signed)
Requesting results to sleep study. °

## 2017-02-23 ENCOUNTER — Encounter: Payer: Self-pay | Admitting: Nurse Practitioner

## 2017-02-23 NOTE — Telephone Encounter (Signed)
Note sent through mychart

## 2017-03-08 ENCOUNTER — Encounter: Payer: Self-pay | Admitting: Nurse Practitioner

## 2017-03-20 ENCOUNTER — Encounter: Payer: PRIVATE HEALTH INSURANCE | Attending: Nurse Practitioner | Admitting: Nutrition

## 2017-03-20 VITALS — Ht 67.0 in | Wt 266.6 lb

## 2017-03-20 DIAGNOSIS — E1165 Type 2 diabetes mellitus with hyperglycemia: Secondary | ICD-10-CM

## 2017-03-20 DIAGNOSIS — E118 Type 2 diabetes mellitus with unspecified complications: Secondary | ICD-10-CM

## 2017-03-20 DIAGNOSIS — Z713 Dietary counseling and surveillance: Secondary | ICD-10-CM | POA: Insufficient documentation

## 2017-03-20 DIAGNOSIS — E119 Type 2 diabetes mellitus without complications: Secondary | ICD-10-CM | POA: Diagnosis not present

## 2017-03-20 DIAGNOSIS — IMO0002 Reserved for concepts with insufficient information to code with codable children: Secondary | ICD-10-CM

## 2017-03-20 NOTE — Patient Instructions (Signed)
Goals 1. Follow MY Plate 2. Exercise 150 minutes a week 3. Eat 2-3 carb choices per meal 4. Cut out snacks 5. Drink water only Get FBS Less than 130 mg/dk Lose 1-2 lbs per week Get A1C less than 6.5%.

## 2017-03-22 NOTE — Progress Notes (Signed)
Diabetes Self-Management Education  Visit Type: First/Initial  Appt. Start Time: 0800  Appt. End Time: 930  03/22/2017  Susan Becker's, identified by name and date of birth, is a 56 y.o. female with a diagnosis of Diabetes: Type 2.  Lab Results  Component Value Date   HGBA1C 6.5 02/02/2017    ASSESSMENT  Height 5\' 7"  (1.702 m), weight 266 lb 9.6 oz (120.9 kg). Body mass index is 41.76 kg/m.      Diabetes Self-Management Education - 03/20/17 0809      Visit Information   Visit Type First/Initial     Initial Visit   Diabetes Type Type 2   Are you currently following a meal plan? No   Are you taking your medications as prescribed? Yes   Date Diagnosed 2015     Health Coping   How would you rate your overall health? Good     Psychosocial Assessment   Patient Belief/Attitude about Diabetes Motivated to manage diabetes   Self-care barriers None   Self-management support Family   Other persons present Patient   Patient Concerns Monitoring;Healthy Lifestyle;Problem Solving;Weight Control;Nutrition/Meal planning   Special Needs None   Preferred Learning Style No preference indicated   Learning Readiness Ready   What is the last grade level you completed in school? 12     Pre-Education Assessment   Patient understands the diabetes disease and treatment process. Needs Instruction   Patient understands incorporating nutritional management into lifestyle. Needs Instruction   Patient undertands incorporating physical activity into lifestyle. Needs Instruction   Patient understands using medications safely. Needs Instruction   Patient understands monitoring blood glucose, interpreting and using results Needs Instruction   Patient understands prevention, detection, and treatment of acute complications. Needs Instruction   Patient understands prevention, detection, and treatment of chronic complications. Needs Instruction   Patient understands how to develop strategies to address  psychosocial issues. Needs Instruction   Patient understands how to develop strategies to promote health/change behavior. Needs Instruction     Complications   Last HgB A1C per patient/outside source 6.7 %   How often do you check your blood sugar? 1-2 times/day   Fasting Blood glucose range (mg/dL) 130-179   Number of hypoglycemic episodes per month 0   Number of hyperglycemic episodes per week 0   Have you had a dilated eye exam in the past 12 months? Yes   Have you had a dental exam in the past 12 months? Yes   Are you checking your feet? Yes   How many days per week are you checking your feet? 7     Dietary Intake   Breakfast 1 packet weight control oatmeal, Dt soda   Snack (morning) nabs, fruit or granola bar, water   Lunch Grilled salad from [employer], Diet Green Tea   Snack (afternoon) Granola bar   Dinner Fish Sandwich from [employer], fry and Dt [employer]) Dt. Soda,  water     Exercise   Exercise Type ADL's;Light (walking / raking leaves)   How many days per week to you exercise? 15   How many minutes per day do you exercise? 3   Total minutes per week of exercise 45     Patient Education   Previous Diabetes Education No   Disease state  Definition of diabetes, type 1 and 2, and the diagnosis of diabetes;Factors that contribute to the development of diabetes   Nutrition management  Role of diet in the treatment of diabetes and  the relationship between the three main macronutrients and blood glucose level;Food label reading, portion sizes and measuring food.;Carbohydrate counting;Information on hints to eating out and maintain blood glucose control.;Meal timing in regards to the patients' current diabetes medication.   Physical activity and exercise  Role of exercise on diabetes management, blood pressure control and cardiac health.   Monitoring Purpose and frequency of SMBG.;Taught/discussed recording of test results and interpretation of SMBG.;Identified appropriate  SMBG and/or A1C goals.   Acute complications Discussed and identified patients' treatment of hyperglycemia.   Chronic complications Relationship between chronic complications and blood glucose control;Assessed and discussed foot care and prevention of foot problems;Nephropathy, what it is, prevention of, the use of ACE, ARB's and early detection of through urine microalbumia.;Reviewed with patient heart disease, higher risk of, and prevention;Identified and discussed with patient  current chronic complications   Psychosocial adjustment Worked with patient to identify barriers to care and solutions;Role of stress on diabetes;Identified and addressed patients feelings and concerns about diabetes     Individualized Goals (developed by patient)   Nutrition Follow meal plan discussed;General guidelines for healthy choices and portions discussed;Adjust meds/carbs with exercise as discussed   Physical Activity Exercise 3-5 times per week;30 minutes per day   Medications take my medication as prescribed   Monitoring  test my blood glucose as discussed   Reducing Risk examine blood glucose patterns     Post-Education Assessment   Patient understands the diabetes disease and treatment process. Needs Review   Patient understands incorporating nutritional management into lifestyle. Needs Review   Patient undertands incorporating physical activity into lifestyle. Needs Review   Patient understands using medications safely. Needs Review     Outcomes   Expected Outcomes Demonstrated interest in learning. Expect positive outcomes   Future DMSE 4-6 wks   Program Status Completed      Individualized Plan for Diabetes Self-Management Training:   Learning Objective:  Patient will have a greater understanding of diabetes self-management. Patient education plan is to attend individual and/or group sessions per assessed needs and concerns.   Plan:   Patient Instructions  Goals 1. Follow MY Plate 2.  Exercise 150 minutes a week 3. Eat 2-3 carb choices per meal 4. Cut out snacks 5. Drink water only Get FBS Less than 130 mg/dk Lose 1-2 lbs per week Get A1C less than 6.5%.    Expected Outcomes:  Demonstrated interest in learning. Expect positive outcomes  Education material provided: Living Well with Diabetes, Food label handouts, A1C conversion sheet, Meal plan card, My Plate and Carbohydrate counting sheet  If problems or questions, patient to contact team via:  Phone and Email  Future DSME appointment: 4-6 wks

## 2017-04-05 ENCOUNTER — Other Ambulatory Visit: Payer: Self-pay | Admitting: Nurse Practitioner

## 2017-04-09 ENCOUNTER — Telehealth: Payer: Self-pay | Admitting: Family Medicine

## 2017-04-09 NOTE — Telephone Encounter (Signed)
Please let the patient know that I did receive her compliance report. It does look like she is using it adequately. It does look like it is helping. Based on insurance guidelines it is necessary for her to follow-up somewhere within the next few weeks-she has an appointment on September 17 she should keep this. The compliance report will be scanned into the system.

## 2017-04-10 NOTE — Telephone Encounter (Signed)
Spoke with patient and informed her per Dr.Scott Luking- He  Received  your compliance report. It does look like you are  using it adequately. It does look like it is helping. Based on insurance guidelines it is necessary for you to follow-up somewhere within the next few weeks-you have an appointment on September 17 you should keep this. The compliance report will be scanned into the system. Patient verbalized understanding.

## 2017-04-17 ENCOUNTER — Encounter: Payer: Self-pay | Admitting: Nutrition

## 2017-04-17 ENCOUNTER — Encounter: Payer: PRIVATE HEALTH INSURANCE | Attending: Family Medicine | Admitting: Nutrition

## 2017-04-17 VITALS — Ht 66.0 in | Wt 259.0 lb

## 2017-04-17 DIAGNOSIS — E669 Obesity, unspecified: Secondary | ICD-10-CM

## 2017-04-17 DIAGNOSIS — E1165 Type 2 diabetes mellitus with hyperglycemia: Secondary | ICD-10-CM

## 2017-04-17 DIAGNOSIS — IMO0002 Reserved for concepts with insufficient information to code with codable children: Secondary | ICD-10-CM

## 2017-04-17 DIAGNOSIS — E119 Type 2 diabetes mellitus without complications: Secondary | ICD-10-CM | POA: Insufficient documentation

## 2017-04-17 DIAGNOSIS — Z713 Dietary counseling and surveillance: Secondary | ICD-10-CM | POA: Diagnosis not present

## 2017-04-17 DIAGNOSIS — E118 Type 2 diabetes mellitus with unspecified complications: Secondary | ICD-10-CM

## 2017-04-17 NOTE — Progress Notes (Signed)
Diabetes Self-Management Education  Visit Type:    Appt. Start Time: 1000  Appt. End Time: 1030 04/17/2017  [name], identified by name and date of birth, is a 56 y.o. female with a diagnosis of Diabetes:  .    She has been only testing in am. FBS 150-180's. Has been working on trying to eat more balanced meals, not skipping meals. Hasn't started exercising much yet but willing to start. Takes Metformin 1000 mg BID.   Admits to eating more at night due to being too hungry. She lost 7 lbs since last visit. She is only drinking water now.  Cut out snacks between meals.   She is making progress. Lab Results  Component Value Date   HGBA1C 6.5 02/02/2017    ASSESSMENT  Height 5\' 6"  (1.676 m), weight 259 lb (117.5 kg). Body mass index is 41.8 kg/m.    Individualized Plan for Diabetes Self-Management Training:   Learning Objective:  Patient will have a greater understanding of diabetes self-management. Patient education plan is to attend individual and/or group sessions per assessed needs and concerns.   Plan:   Goals 1. Increase low carb vegetables- 2 svg. With lunch and dinner 2. Increase  Exercise 30 minutes 3-4 times per week 3. Set alarm at 7-8 pm to remember to take Metformin at night 4. Lose 5 lbs in the next month Get A1C less than 6.5%  Expected Outcomes:    Improved blood sugars and weight loss.  Education material provided: Living Well with Diabetes, Food label handouts, A1C conversion sheet, Meal plan card, My Plate and Carbohydrate counting sheet  If problems or questions, patient to contact team via:  Phone and Email  Future DSME appointment:   3 months.

## 2017-04-17 NOTE — Patient Instructions (Signed)
Goals 1. Increase low carb vegetables- 2 svg. With lunch and dinner 2. Increase  Exercise 30 minutes 3-4 times per week 3. Set alarm at 7-8 pm to remember to take Metformin at night 4. Lose 5 lbs in the next month Get A1C less than 6.5%

## 2017-04-27 ENCOUNTER — Other Ambulatory Visit: Payer: Self-pay | Admitting: Nurse Practitioner

## 2017-05-07 ENCOUNTER — Ambulatory Visit (INDEPENDENT_AMBULATORY_CARE_PROVIDER_SITE_OTHER): Payer: PRIVATE HEALTH INSURANCE | Admitting: Nurse Practitioner

## 2017-05-07 ENCOUNTER — Encounter: Payer: Self-pay | Admitting: Nurse Practitioner

## 2017-05-07 VITALS — BP 124/80 | Ht 66.0 in | Wt 263.0 lb

## 2017-05-07 DIAGNOSIS — G4733 Obstructive sleep apnea (adult) (pediatric): Secondary | ICD-10-CM | POA: Diagnosis not present

## 2017-05-07 DIAGNOSIS — Z9989 Dependence on other enabling machines and devices: Secondary | ICD-10-CM | POA: Diagnosis not present

## 2017-05-07 DIAGNOSIS — E119 Type 2 diabetes mellitus without complications: Secondary | ICD-10-CM

## 2017-05-07 DIAGNOSIS — I10 Essential (primary) hypertension: Secondary | ICD-10-CM

## 2017-05-07 DIAGNOSIS — Z79899 Other long term (current) drug therapy: Secondary | ICD-10-CM

## 2017-05-07 DIAGNOSIS — E785 Hyperlipidemia, unspecified: Secondary | ICD-10-CM

## 2017-05-07 DIAGNOSIS — N950 Postmenopausal bleeding: Secondary | ICD-10-CM | POA: Diagnosis not present

## 2017-05-07 LAB — POCT GLYCOSYLATED HEMOGLOBIN (HGB A1C): Hemoglobin A1C: 6.1

## 2017-05-07 NOTE — Progress Notes (Signed)
Subjective:  Presents for recheck on her diabetes. FBS at home running slightly high at 150-190. Doing much better with her diet. Has had 2 visits with the dietitian. Is working on increasing her activity. No chest pain/ischemic type pain or shortness of breath. Is using her CPAP nightly for OSA. Went from 42 episodes of apnea per hour down to one. Energy much improved. Feeling much better. Regular cleaning of her machine.  Objective:   BP 124/80   Ht 5\' 6"  (1.676 m)   Wt 263 lb (119.3 kg)   BMI 42.45 kg/m  NAD. Alert, oriented. Lungs clear. Heart regular rate rhythm. Lower extremities no edema. Results for orders placed or performed in visit on 05/07/17  POCT glycosylated hemoglobin (Hb A1C)  Result Value Ref Range   Hemoglobin A1C 6.1    Diabetic Foot Exam - Simple   Simple Foot Form Diabetic Foot exam was performed with the following findings:  Yes 05/07/2017  8:47 AM  Visual Inspection No deformities, no ulcerations, no other skin breakdown bilaterally:  Yes Sensation Testing Intact to touch and monofilament testing bilaterally:  Yes Pulse Check See comments:  Yes Comments DP pulses present bilat; toes warm      Assessment:   Problem List Items Addressed This Visit      Cardiovascular and Mediastinum   Essential hypertension, benign   Relevant Orders   Basic metabolic panel     Respiratory   Obstructive sleep apnea treated with continuous positive airway pressure (CPAP)     Endocrine   Diabetes mellitus without complication (HCC) - Primary   Relevant Orders   POCT glycosylated hemoglobin (Hb A1C) (Completed)   Microalbumin / creatinine urine ratio     Other   Hyperlipidemia LDL goal <100   Relevant Orders   Lipid panel   Post-menopausal bleeding    Other Visit Diagnoses    High risk medication use       Relevant Orders   Hepatic function panel       Plan:  Continue current medications. A1c much improved. Advised patient to look at her evening meal to see  if anything could be raising her blood sugar. Does not eat anything after supper. Patient to check to see if her insurance will cover [employer] since this will provide more variable dosing and possibly more weight loss. Recommend physical this fall. Given papers for lab work. Gets flu vaccine at work.

## 2017-05-07 NOTE — Telephone Encounter (Signed)
Can we prescribe first and then get denial I think it will make it easier.

## 2017-05-08 ENCOUNTER — Other Ambulatory Visit: Payer: Self-pay | Admitting: Nurse Practitioner

## 2017-05-08 MED ORDER — BLOOD GLUCOSE MONITOR KIT: PACK | 0 refills | Status: DC

## 2017-05-08 NOTE — Telephone Encounter (Signed)
Nurse's-please bear 5 dosage for this patient. Her med list has a 10 mg Lexapro and 20 mg.? Please verify her dosage-please call the patient?

## 2017-05-08 NOTE — Telephone Encounter (Signed)
Last seen on 05/07/17

## 2017-05-21 ENCOUNTER — Encounter: Payer: PRIVATE HEALTH INSURANCE | Attending: Family Medicine | Admitting: Nutrition

## 2017-05-21 ENCOUNTER — Other Ambulatory Visit: Payer: Self-pay | Admitting: Nurse Practitioner

## 2017-05-21 VITALS — Ht 68.0 in | Wt 265.0 lb

## 2017-05-21 DIAGNOSIS — E669 Obesity, unspecified: Secondary | ICD-10-CM

## 2017-05-21 DIAGNOSIS — E118 Type 2 diabetes mellitus with unspecified complications: Secondary | ICD-10-CM

## 2017-05-21 DIAGNOSIS — IMO0002 Reserved for concepts with insufficient information to code with codable children: Secondary | ICD-10-CM

## 2017-05-21 DIAGNOSIS — E119 Type 2 diabetes mellitus without complications: Secondary | ICD-10-CM | POA: Diagnosis not present

## 2017-05-21 DIAGNOSIS — Z1231 Encounter for screening mammogram for malignant neoplasm of breast: Secondary | ICD-10-CM

## 2017-05-21 DIAGNOSIS — Z713 Dietary counseling and surveillance: Secondary | ICD-10-CM | POA: Insufficient documentation

## 2017-05-21 DIAGNOSIS — E1165 Type 2 diabetes mellitus with hyperglycemia: Secondary | ICD-10-CM

## 2017-05-21 NOTE — Patient Instructions (Addendum)
Goals 1. Pack lunch every day. 2. Exercise,15-30 minutes. A day on exercise equipment  t5 day a week 3. Trying to eat out only 1 a week. 4. Lose 1 lb per eek 5. Get back to counting carbs.

## 2017-05-21 NOTE — Progress Notes (Signed)
Diabetes Self-Management Education  Visit Type:    Appt. Start Time: 1000  Appt. End Time: 1030 05/21/2017  [name], identified by name and date of birth, is a 55 y.o. female with a diagnosis of Diabetes:  .    She has been only testing in am. FBS 150-180's. Has been working on trying to eat more balanced meals, not skipping meals. Hasn't started exercising much yet but willing to start. Takes Metformin 1000 mg BID.   Admits to eating more at night due to being too hungry. She lost 7 lbs since last visit. She is only drinking water now.  Cut out snacks between meals.   She is making progress. Lab Results  Component Value Date   HGBA1C 6.1 05/07/2017    ASSESSMENT  Height 5\' 8"  (1.727 m), weight 265 lb (120.2 kg). Body mass index is 40.29 kg/m.    Individualized Plan for Diabetes Self-Management Training:   Learning Objective:  Patient will have a greater understanding of diabetes self-management. Patient education plan is to attend individual and/or group sessions per assessed needs and concerns.   Plan:  Goals 1. Pack lunch every day. 2. Exercise,15-30 minutes. A day on exercise equipment  t5 day a week 3. Trying to eat out only 1 a week. 4. Lose 1 lb per eek 5. Get back to counting carbs  Expected Outcomes:    Improved blood sugars and weight loss.  Education material provided: Living Well with Diabetes, Food label handouts, A1C conversion sheet, Meal plan card, My Plate and Carbohydrate counting sheet  If problems or questions, patient to contact team via:  Phone and Email  Future DSME appointment:   3 months.

## 2017-05-22 ENCOUNTER — Encounter: Payer: Self-pay | Admitting: Nurse Practitioner

## 2017-05-23 ENCOUNTER — Other Ambulatory Visit: Payer: Self-pay | Admitting: Nurse Practitioner

## 2017-05-31 ENCOUNTER — Other Ambulatory Visit: Payer: Self-pay | Admitting: Family Medicine

## 2017-06-18 ENCOUNTER — Ambulatory Visit (HOSPITAL_COMMUNITY): Payer: PRIVATE HEALTH INSURANCE

## 2017-06-18 ENCOUNTER — Other Ambulatory Visit: Payer: Self-pay | Admitting: Family Medicine

## 2017-06-20 ENCOUNTER — Ambulatory Visit (HOSPITAL_COMMUNITY)
Admission: RE | Admit: 2017-06-20 | Discharge: 2017-06-20 | Disposition: A | Discharge status: Home / Self Care | Payer: PRIVATE HEALTH INSURANCE | Source: Ambulatory Visit | Attending: Nurse Practitioner | Admitting: Nurse Practitioner

## 2017-06-20 DIAGNOSIS — Z1231 Encounter for screening mammogram for malignant neoplasm of breast: Secondary | ICD-10-CM | POA: Diagnosis not present

## 2017-06-21 LAB — MICROALBUMIN / CREATININE URINE RATIO
Creatinine, Urine: 100.4 mg/dL
MICROALB/CREAT RATIO: 8.1 mg/g{creat} (ref 0.0–30.0)
Microalbumin, Urine: 8.1 ug/mL

## 2017-06-21 LAB — BASIC METABOLIC PANEL
BUN / CREAT RATIO: 14 (ref 9–23)
BUN: 9 mg/dL (ref 6–24)
CO2: 23 mmol/L (ref 20–29)
Calcium: 9.3 mg/dL (ref 8.7–10.2)
Chloride: 102 mmol/L (ref 96–106)
Creatinine, Ser: 0.63 mg/dL (ref 0.57–1.00)
GFR, EST AFRICAN AMERICAN: 118 mL/min/{1.73_m2} (ref >59)
GFR, EST NON AFRICAN AMERICAN: 103 mL/min/{1.73_m2} (ref >59)
Glucose: 184 mg/dL — ABNORMAL HIGH (ref 65–99)
POTASSIUM: 4.4 mmol/L (ref 3.5–5.2)
SODIUM: 141 mmol/L (ref 134–144)

## 2017-06-21 LAB — LIPID PANEL
CHOL/HDL RATIO: 4.5 ratio — AB (ref 0.0–4.4)
Cholesterol, Total: 184 mg/dL (ref 100–199)
HDL: 41 mg/dL (ref >39)
LDL Calculated: 113 mg/dL — ABNORMAL HIGH (ref 0–99)
Triglycerides: 149 mg/dL (ref 0–149)
VLDL Cholesterol Cal: 30 mg/dL (ref 5–40)

## 2017-06-21 LAB — HEPATIC FUNCTION PANEL
ALT: 14 IU/L (ref 0–32)
AST: 14 IU/L (ref 0–40)
Albumin: 4.4 g/dL (ref 3.5–5.5)
Alkaline Phosphatase: 70 IU/L (ref 39–117)
BILIRUBIN TOTAL: 0.3 mg/dL (ref 0.0–1.2)
BILIRUBIN, DIRECT: 0.07 mg/dL (ref 0.00–0.40)
TOTAL PROTEIN: 7.1 g/dL (ref 6.0–8.5)

## 2017-06-22 ENCOUNTER — Ambulatory Visit (INDEPENDENT_AMBULATORY_CARE_PROVIDER_SITE_OTHER): Payer: PRIVATE HEALTH INSURANCE | Admitting: Nurse Practitioner

## 2017-06-22 ENCOUNTER — Encounter: Payer: Self-pay | Admitting: Nurse Practitioner

## 2017-06-22 VITALS — BP 120/84 | Ht 65.0 in | Wt 268.1 lb

## 2017-06-22 DIAGNOSIS — Z1151 Encounter for screening for human papillomavirus (HPV): Secondary | ICD-10-CM

## 2017-06-22 DIAGNOSIS — Z01419 Encounter for gynecological examination (general) (routine) without abnormal findings: Secondary | ICD-10-CM

## 2017-06-22 DIAGNOSIS — E785 Hyperlipidemia, unspecified: Secondary | ICD-10-CM | POA: Diagnosis not present

## 2017-06-22 DIAGNOSIS — Z124 Encounter for screening for malignant neoplasm of cervix: Secondary | ICD-10-CM

## 2017-06-22 DIAGNOSIS — E119 Type 2 diabetes mellitus without complications: Secondary | ICD-10-CM | POA: Diagnosis not present

## 2017-06-22 DIAGNOSIS — I10 Essential (primary) hypertension: Secondary | ICD-10-CM

## 2017-06-22 MED ORDER — NYSTATIN 100000 UNIT/GM EX CREA: 1.0000 "application " | TOPICAL_CREAM | Freq: Two times a day (BID) | CUTANEOUS | 0 refills | Status: DC

## 2017-06-22 MED ORDER — PRAVASTATIN SODIUM 10 MG PO TABS: 10.0000 mg | ORAL_TABLET | Freq: Every day | ORAL | 5 refills | Status: DC

## 2017-06-22 NOTE — Patient Instructions (Signed)
For your sinus symptoms you may take over the counter Claritin or Allegra- follow the directions on the packaging. These are not sedating and should not make you sleepy during the day. You may also take Nasacort or Flonase nasal spray to alleviate sinus/allergy symptoms, as well. These are also over the counter. Follow directions on the packaging.

## 2017-06-22 NOTE — Progress Notes (Signed)
Subjective:    Patient ID: Susan Becker, female    DOB: April 12, 1961, 56 y.o.   MRN: [id number]  HPI: 56 y/o female presents today for annual well woman exam. Pt reports mammogram performed this week with no concerns, colonoscopy 2017 with no concerns, flu shot up to date for the season. Pt reports annual vision exams and biannual dental exams with no concerns at this time. Pt reports she has been using her CPAP machine every night- sleeping well with increased energy. Pt reports self monitoring of BP at home occasionally with stable readings less than 125/90. Pt reports taking fasting blood sugar every morning with readings in the 180's on average. Pt reports taking metformin as prescribed, but admits to frequently missing doses of tanzeum. Pt reports a diet with frequent fast food- states she is aware of the effect this has on her DM and is working to [employer] more meals at home. Pt reports starting to walking on the treadmill 3-4 times per week, but reports limited activity otherwise. LMP 11/2016 with minimal spotting at that time. Pt reports she continues to take the Provera with no adverse effects. She has not had a change in sexual partner since last visit. Reports mild itching to labia with no discharge since the beginning of October. She has not tried anything to make this better and nothing seems to make it worse. Pt reports increased sinus and ear pressure since late September with some "dizziness" when bending over on occasion. Pt has not taken anything for her symptoms. Pt reports dizziness resolves quickly and only occurs when bending her head over. She reports similar symptoms every season change. Has not been taking her Pravastatin.   Review of Systems  Constitutional: Negative for chills, fatigue and fever.  HENT: Positive for congestion, postnasal drip and sinus pressure.   Eyes: Negative for itching and visual disturbance.  Respiratory: Negative for chest tightness, shortness of breath and  wheezing.   Cardiovascular: Negative for chest pain, palpitations and leg swelling.  Gastrointestinal: Negative for abdominal pain, blood in stool, constipation and diarrhea.  Genitourinary: Negative for frequency, urgency, vaginal bleeding and vaginal discharge.  Musculoskeletal: Negative for arthralgias, back pain and joint swelling.  Skin: Negative for color change.  Allergic/Immunologic: Positive for environmental allergies.  Neurological: Positive for dizziness. Negative for weakness, light-headedness and headaches.  Hematological: Does not bruise/bleed easily.  Psychiatric/Behavioral: Negative for behavioral problems and sleep disturbance. The patient is not nervous/anxious.    Depression screen PHQ 2/9 06/22/2017  Decreased Interest 0  Down, Depressed, Hopeless 0  PHQ - 2 Score 0       Objective:   Physical Exam  Constitutional: She is oriented to person, place, and time. She appears well-developed. No distress.  HENT:  Right Ear: Tympanic membrane is retracted. A middle ear effusion is present.  Left Ear: Tympanic membrane is retracted. A middle ear effusion is present.  Nose: Rhinorrhea present.  Mouth/Throat: Oropharynx is clear and moist. No oropharyngeal exudate.  Eyes: Pupils are equal, round, and reactive to light.  Neck: Normal range of motion. Neck supple. No tracheal deviation present. No thyromegaly present.  Cardiovascular: Normal rate, regular rhythm and normal heart sounds.  Exam reveals no gallop.   No murmur heard. Pulmonary/Chest: Effort normal and breath sounds normal.  Breasts equal in size and shape bilaterally, no deformity or skin changes noted on inspection. No masses, tenderness, nodularity, edema, or nipple discharge noted on palpation bilaterally. No axillary adenopathy noted bilaterally.   Abdominal:  Soft. Bowel sounds are normal. She exhibits no distension. There is no tenderness.  Genitourinary: Uterus normal. Cervix exhibits friability.    Genitourinary Comments: Moderate creamy, white, non-odorous discharge noted on cervix and vaginal walls. Mild erythema to labia minora bilaterally.   Musculoskeletal: Normal range of motion. She exhibits no edema.  Lymphadenopathy:    She has no cervical adenopathy.  Neurological: She is alert and oriented to person, place, and time.  Skin: Skin is warm and dry. No rash noted.  Psychiatric: She has a normal mood and affect. Her behavior is normal.  Vitals reviewed.  Vitals:   06/22/17 0810  BP: 120/84  Weight: 268 lb 2 oz (121.6 kg)  Height: 5\' 5"  (1.651 m)    Results for orders placed or performed in visit on 24/23/53  Basic metabolic panel  Result Value Ref Range   Glucose 184 (H) 65 - 99 mg/dL   BUN 9 6 - 24 mg/dL   Creatinine, Ser 0.63 0.57 - 1.00 mg/dL   GFR calc non Af Amer 103 >59 mL/min/1.73   GFR calc Af Amer 118 >59 mL/min/1.73   BUN/Creatinine Ratio 14 9 - 23   Sodium 141 134 - 144 mmol/L   Potassium 4.4 3.5 - 5.2 mmol/L   Chloride 102 96 - 106 mmol/L   CO2 23 20 - 29 mmol/L   Calcium 9.3 8.7 - 10.2 mg/dL  Lipid panel  Result Value Ref Range   Cholesterol, Total 184 100 - 199 mg/dL   Triglycerides 149 0 - 149 mg/dL   HDL 41 >39 mg/dL   VLDL Cholesterol Cal 30 5 - 40 mg/dL   LDL Calculated 113 (H) 0 - 99 mg/dL   Chol/HDL Ratio 4.5 (H) 0.0 - 4.4 ratio  Hepatic function panel  Result Value Ref Range   Total Protein 7.1 6.0 - 8.5 g/dL   Albumin 4.4 3.5 - 5.5 g/dL   Bilirubin Total 0.3 0.0 - 1.2 mg/dL   Bilirubin, Direct 0.07 0.00 - 0.40 mg/dL   Alkaline Phosphatase 70 39 - 117 IU/L   AST 14 0 - 40 IU/L   ALT 14 0 - 32 IU/L  Microalbumin / creatinine urine ratio  Result Value Ref Range   Creatinine, Urine 100.4 Not Estab. mg/dL   Albumin, Urine 8.1 Not Estab. ug/mL   Microalb/Creat Ratio 8.1 0.0 - 30.0 mg/g creat  POCT glycosylated hemoglobin (Hb A1C)  Result Value Ref Range   Hemoglobin A1C 6.1    Diabetic Foot Exam - Simple   Simple Foot  Form Diabetic Foot exam was performed with the following findings:  Yes 06/22/2017  8:30 AM  Visual Inspection No deformities, no ulcerations, no other skin breakdown bilaterally:  Yes Sensation Testing Intact to touch and monofilament testing bilaterally:  Yes Pulse Check Posterior Tibialis and Dorsalis pulse intact bilaterally:  Yes Comments Calloused thickening to heels bilaterally.        Assessment & Plan:  1. Well woman exam - Labs reivewed - Pap IG and HPV (high risk) DNA detection - Nystatin cream ordered for erythema and itching to external vulva - instructed to contact the office if symptoms do not improve - Continue provera at this time - Provided information for OTC Claritin or Allegra and Nasacort or Flonase for seasonal allergy symptoms  2. Screening for cervical cancer - Pap IG and HPV (high risk) DNA detection  3. Screening for HPV (human papillomavirus) - Pap IG and HPV (high risk) DNA detection  4. Essential hypertension, benign -  Stable- pt encouraged to continue medication and monitoring at home - Return in 6 months for follow-up  5. Hyperlipidemia LDL goal <100 - Pravastatin 10mg  reordered - Return in 6 months for follow-up  6. Type 2 diabetes mellitus without complication, without long-term current use of insulin (HCC) - Pt encouraged to continue to improve diet and continue walking - Pt encouraged to continue tanzeum as prescribed without missing doses - Pt encouraged to continue to monitor glucose levels on a daily bases - Return in 6 months for follow-up   Meds ordered this encounter  Medications  . nystatin cream (MYCOSTATIN)    Sig: Apply 1 application topically 2 (two) times daily. PRN external itching    Dispense:  30 g    Refill:  0    Order Specific Question:   Supervising Provider    Answer:   Mikey Kirschner [[id number]]  . pravastatin (PRAVACHOL) 10 MG tablet    Sig: Take 1 tablet (10 mg total) by mouth daily.    Dispense:  30 tablet     Refill:  5    Order Specific Question:   Supervising Provider    Answer:   Mikey Kirschner [[id number]]   Return in about 6 months (around 12/20/2017).

## 2017-06-25 ENCOUNTER — Encounter: Payer: Self-pay | Admitting: Nurse Practitioner

## 2017-06-26 LAB — PAP IG AND HPV HIGH-RISK
HPV, high-risk: NEGATIVE
PAP SMEAR COMMENT: 0

## 2017-06-28 ENCOUNTER — Other Ambulatory Visit: Payer: Self-pay | Admitting: Nurse Practitioner

## 2017-07-10 ENCOUNTER — Other Ambulatory Visit: Payer: Self-pay | Admitting: Nurse Practitioner

## 2017-07-30 ENCOUNTER — Ambulatory Visit: Payer: PRIVATE HEALTH INSURANCE | Admitting: Nutrition

## 2017-09-14 ENCOUNTER — Other Ambulatory Visit: Payer: Self-pay | Admitting: Nurse Practitioner

## 2017-09-14 ENCOUNTER — Encounter: Payer: Self-pay | Admitting: Nurse Practitioner

## 2017-09-14 MED ORDER — AMOXICILLIN-POT CLAVULANATE 875-125 MG PO TABS: 1.0000 | ORAL_TABLET | Freq: Two times a day (BID) | ORAL | 0 refills | Status: DC

## 2017-09-28 ENCOUNTER — Other Ambulatory Visit: Payer: Self-pay | Admitting: *Deleted

## 2017-09-28 MED ORDER — ALBIGLUTIDE 50 MG ~~LOC~~ PEN: PEN_INJECTOR | SUBCUTANEOUS | 5 refills | Status: DC

## 2017-10-09 ENCOUNTER — Encounter: Payer: Self-pay | Admitting: Family Medicine

## 2017-10-09 ENCOUNTER — Telehealth: Payer: Self-pay | Admitting: *Deleted

## 2017-10-09 ENCOUNTER — Ambulatory Visit: Payer: PRIVATE HEALTH INSURANCE | Admitting: Family Medicine

## 2017-10-09 VITALS — BP 146/86 | Temp 99.2°F | Ht 65.0 in | Wt 267.0 lb

## 2017-10-09 DIAGNOSIS — J329 Chronic sinusitis, unspecified: Secondary | ICD-10-CM | POA: Diagnosis not present

## 2017-10-09 DIAGNOSIS — J310 Chronic rhinitis: Secondary | ICD-10-CM

## 2017-10-09 MED ORDER — CEFDINIR 300 MG PO CAPS: 300.0000 mg | ORAL_CAPSULE | Freq: Two times a day (BID) | ORAL | 0 refills | Status: DC

## 2017-10-09 NOTE — Progress Notes (Signed)
   Subjective:    Patient ID: Susan Becker, female    DOB: 06-30-1961, 57 y.o.   MRN: [id number]  HPI  Patient is here today with complaints of headache,head congestion with green mucus. Left ear pain and ringing,sorethroat,productive cough, runny nose on going since 10/07/2017. She has been taking amoxicillin that she had left over from a previous appt.Not taking any other medications for this  Pos nasl disch and gunky  Some prod cough   No fever   achey at times  Had some amox left over  enrgy so so    No achey in muscles or joints  Last week son came in for sinus trouble s   Had flu shot frontal heafahe      .  Review of Systems No headache, no major weight loss or weight gain, no chest pain no back pain abdominal pain no change in bowel habits complete ROS otherwise negative      Objective:   Physical Exam  Alert, mild malaise. Hydration good Vitals stable. frontal/ maxillary tenderness evident positive nasal congestion. pharynx normal neck supple  lungs clear/no crackles or wheezes. heart regular in rhythm       Assessment & Plan:  Impression rhinosinusitis likely post viral, discussed with patient. plan antibiotics prescribed. Questions answered. Symptomatic care discussed. warning signs discussed. WSL

## 2017-10-09 NOTE — Telephone Encounter (Signed)
[facility] calling to get alternative to tanzeum. Pharm states this drug is discontinued. Please advise.

## 2017-10-12 ENCOUNTER — Other Ambulatory Visit: Payer: Self-pay | Admitting: Nurse Practitioner

## 2017-10-12 ENCOUNTER — Encounter: Payer: Self-pay | Admitting: Nurse Practitioner

## 2017-10-12 ENCOUNTER — Telehealth: Payer: Self-pay | Admitting: *Deleted

## 2017-10-12 MED ORDER — DULAGLUTIDE 0.75 MG/0.5ML ~~LOC~~ SOAJ: SUBCUTANEOUS | 0 refills | Status: DC

## 2017-10-12 NOTE — Telephone Encounter (Signed)
Fax from [facility]. tanzeum 50mg  pen has been discontinued. No longer available. Please advise [facility].

## 2017-10-12 NOTE — Telephone Encounter (Signed)
Sent a note through my chart

## 2017-11-22 ENCOUNTER — Other Ambulatory Visit: Payer: Self-pay | Admitting: Nurse Practitioner

## 2017-12-05 ENCOUNTER — Other Ambulatory Visit: Payer: Self-pay | Admitting: Family Medicine

## 2017-12-21 ENCOUNTER — Ambulatory Visit: Payer: PRIVATE HEALTH INSURANCE | Admitting: Nurse Practitioner

## 2017-12-21 ENCOUNTER — Encounter: Payer: Self-pay | Admitting: Nurse Practitioner

## 2017-12-21 VITALS — BP 128/80 | Ht 65.0 in | Wt 257.0 lb

## 2017-12-21 DIAGNOSIS — N912 Amenorrhea, unspecified: Secondary | ICD-10-CM

## 2017-12-21 DIAGNOSIS — E119 Type 2 diabetes mellitus without complications: Secondary | ICD-10-CM

## 2017-12-21 LAB — POCT GLYCOSYLATED HEMOGLOBIN (HGB A1C): Hemoglobin A1C: 5.8

## 2017-12-21 NOTE — Progress Notes (Signed)
Subjective: Presents for routine follow-up on her diabetes.  Has joined weight watchers and has lost 16 pounds over the past couple of months.  Is scheduled for her regular eye exam this month.  Has started a walking program.  Fasting sugars at home averaging 150.  Is unsure what her numbers are at other times of the day.  No chest pain/ischemic type pain or shortness of breath.  No edema.  Will be getting her routine lab work done through her job, will send [place] a copy.  Currently on Provera.  Has not had a menstrual cycle in over a year.  Would like blood testing again to see if she is finished menopause.   Objective:   BP 128/80   Ht 5\' 5"  (1.651 m)   Wt 257 lb (116.6 kg)   BMI 42.77 kg/m  NAD.  Alert, oriented.  Lungs clear.  Heart regular rate and rhythm.  Lower extremities trace pitting edema. Results for orders placed or performed in visit on 12/21/17  POCT glycosylated hemoglobin (Hb A1C)  Result Value Ref Range   Hemoglobin A1C 5.8      Assessment:   Problem List Items Addressed This Visit      Endocrine   Diabetes mellitus without complication ([place]) - Primary   Relevant Orders   POCT glycosylated hemoglobin (Hb A1C) (Completed)    Other Visit Diagnoses    Amenorrhea       Relevant Orders   [place]       Plan: Recommend checking sugars  at other times of the day record and bring to next visit.  Continue activity and weight loss efforts.  Patient to send [place] a copy of her lab work done through her job.  [place] pending.  Depending on results patient may be able to stop her Provera. Return in about 6 months (around 06/23/2018) for physical.

## 2017-12-22 LAB — FOLLICLE STIMULATING HORMONE: FSH: 23.1 m[IU]/mL

## 2018-01-03 ENCOUNTER — Other Ambulatory Visit: Payer: Self-pay | Admitting: Nurse Practitioner

## 2018-01-17 ENCOUNTER — Other Ambulatory Visit: Payer: Self-pay | Admitting: Nurse Practitioner

## 2018-05-15 ENCOUNTER — Ambulatory Visit: Payer: PRIVATE HEALTH INSURANCE | Admitting: Family Medicine

## 2018-05-15 ENCOUNTER — Encounter: Payer: Self-pay | Admitting: Family Medicine

## 2018-05-15 VITALS — BP 130/82 | Temp 99.5°F | Ht 65.0 in | Wt 263.8 lb

## 2018-05-15 DIAGNOSIS — J9801 Acute bronchospasm: Secondary | ICD-10-CM | POA: Diagnosis not present

## 2018-05-15 DIAGNOSIS — J019 Acute sinusitis, unspecified: Secondary | ICD-10-CM | POA: Diagnosis not present

## 2018-05-15 DIAGNOSIS — B9689 Other specified bacterial agents as the cause of diseases classified elsewhere: Secondary | ICD-10-CM | POA: Diagnosis not present

## 2018-05-15 MED ORDER — ALBUTEROL SULFATE HFA 108 (90 BASE) MCG/ACT IN AERS: 2.0000 | INHALATION_SPRAY | Freq: Four times a day (QID) | RESPIRATORY_TRACT | 2 refills | Status: DC | PRN

## 2018-05-15 MED ORDER — AMOXICILLIN-POT CLAVULANATE 875-125 MG PO TABS: 1.0000 | ORAL_TABLET | Freq: Two times a day (BID) | ORAL | 0 refills | Status: DC

## 2018-05-15 NOTE — Progress Notes (Signed)
   Subjective:    Patient ID: Susan Becker, female    DOB: September 14, 1960, 57 y.o.   MRN: [id number]   Sinus Problem   This is a new problem. The current episode started in the past 7 days. Associated symptoms include congestion, coughing, ear pain, headaches, sinus pressure and a sore throat. Pertinent negatives include no shortness of breath. Treatments tried: otc cold meds.   She relates a lot of head congestion drainage blowing out clear liquid She also relates a lot of postnasal drip coughing up yellow mucus with blood mixed in she denies high fevers relates just low-grade fever energy level is low  Review of Systems  Constitutional: Negative for activity change and fever.  HENT: Positive for congestion, ear pain, rhinorrhea, sinus pressure and sore throat.   Eyes: Negative for discharge.  Respiratory: Positive for cough. Negative for shortness of breath and wheezing.   Cardiovascular: Negative for chest pain.  Neurological: Positive for headaches.       Objective:   Physical Exam  Constitutional: She appears well-developed.  HENT:  Head: Normocephalic.  Nose: Nose normal.  Mouth/Throat: Oropharynx is clear and moist. No oropharyngeal exudate.  Neck: Neck supple.  Cardiovascular: Normal rate and normal heart sounds.  No murmur heard. Pulmonary/Chest: Effort normal and breath sounds normal. She has no wheezes.  Lymphadenopathy:    She has no cervical adenopathy.  Skin: Skin is warm and dry.  Nursing note and vitals reviewed.   Patient has bronchial/bronchospasm cough      Assessment & Plan:  Significant sinus infection Antibiotics prescribed Warning signs discussed Calculi involvement with reactive airway tightness of the bronchioles with cough Hold off on prednisone Albuterol on a as needed basis O2 saturation 98% Follow-up if progressive troubles  I believe the blood in the mucus is related to postnasal from the sinuses if this becomes progressively worse or  other problems immediate follow-up as necessary warning signs were discussed

## 2018-05-22 ENCOUNTER — Other Ambulatory Visit: Payer: Self-pay | Admitting: Family Medicine

## 2018-05-22 DIAGNOSIS — Z1231 Encounter for screening mammogram for malignant neoplasm of breast: Secondary | ICD-10-CM

## 2018-05-23 ENCOUNTER — Telehealth: Payer: Self-pay | Admitting: Family Medicine

## 2018-05-23 ENCOUNTER — Other Ambulatory Visit: Payer: Self-pay | Admitting: *Deleted

## 2018-05-23 MED ORDER — DOXYCYCLINE HYCLATE 100 MG PO TABS: 100.0000 mg | ORAL_TABLET | Freq: Two times a day (BID) | ORAL | 0 refills | Status: DC

## 2018-05-23 MED ORDER — FLUCONAZOLE 150 MG PO TABS: ORAL_TABLET | ORAL | 0 refills | Status: DC

## 2018-05-23 NOTE — Telephone Encounter (Signed)
Pt is almost finished with her antibiotic she was seen last week in the office with Dr. Nicki Reaper. She is still coughing up thick yellow mucus and is wondering if she needs another round of antibiotics. Please send to [facility], [place] [address]

## 2018-05-23 NOTE — Telephone Encounter (Signed)
Discussed with pt. Doxy and diflucan for yeast infection per dr Nicki Reaper sent to pharm.

## 2018-05-23 NOTE — Telephone Encounter (Signed)
Patient just finished Augmentin for sinus infection

## 2018-05-23 NOTE — Telephone Encounter (Signed)
It is reasonable to do a second round of antibiotics I would recommend doxycycline 100 mg 1 twice daily for 7 days take with a snack in a tall glass of water

## 2018-05-23 NOTE — Telephone Encounter (Signed)
Pt is also worried about signs of a yeast infection and is hoping something can be called in for that.

## 2018-05-29 ENCOUNTER — Ambulatory Visit (HOSPITAL_COMMUNITY)
Admission: RE | Admit: 2018-05-29 | Discharge: 2018-05-29 | Disposition: A | Discharge status: Home / Self Care | Payer: PRIVATE HEALTH INSURANCE | Source: Ambulatory Visit | Attending: Family Medicine | Admitting: Family Medicine

## 2018-05-29 DIAGNOSIS — Z1231 Encounter for screening mammogram for malignant neoplasm of breast: Secondary | ICD-10-CM | POA: Diagnosis not present

## 2018-06-16 ENCOUNTER — Other Ambulatory Visit: Payer: Self-pay | Admitting: Family Medicine

## 2018-06-17 ENCOUNTER — Other Ambulatory Visit: Payer: Self-pay | Admitting: *Deleted

## 2018-06-17 MED ORDER — DULAGLUTIDE 0.75 MG/0.5ML ~~LOC~~ SOAJ: SUBCUTANEOUS | 0 refills | Status: DC

## 2018-06-17 NOTE — Telephone Encounter (Signed)
1 refill, keep follow-up appointment

## 2018-06-18 MED ORDER — ESCITALOPRAM OXALATE 20 MG PO TABS: 20.0000 mg | ORAL_TABLET | Freq: Every day | ORAL | 0 refills | Status: DC

## 2018-06-24 ENCOUNTER — Encounter: Payer: PRIVATE HEALTH INSURANCE | Admitting: Nurse Practitioner

## 2018-06-24 ENCOUNTER — Encounter: Payer: PRIVATE HEALTH INSURANCE | Admitting: Family Medicine

## 2018-06-26 ENCOUNTER — Other Ambulatory Visit: Payer: Self-pay | Admitting: Family Medicine

## 2018-07-04 ENCOUNTER — Encounter: Payer: Self-pay | Admitting: Family Medicine

## 2018-07-04 ENCOUNTER — Ambulatory Visit (INDEPENDENT_AMBULATORY_CARE_PROVIDER_SITE_OTHER): Payer: PRIVATE HEALTH INSURANCE | Admitting: Family Medicine

## 2018-07-04 VITALS — BP 138/86 | Ht 65.0 in | Wt 268.8 lb

## 2018-07-04 DIAGNOSIS — E119 Type 2 diabetes mellitus without complications: Secondary | ICD-10-CM

## 2018-07-04 DIAGNOSIS — I10 Essential (primary) hypertension: Secondary | ICD-10-CM

## 2018-07-04 DIAGNOSIS — Z0000 Encounter for general adult medical examination without abnormal findings: Secondary | ICD-10-CM | POA: Diagnosis not present

## 2018-07-04 DIAGNOSIS — E785 Hyperlipidemia, unspecified: Secondary | ICD-10-CM | POA: Diagnosis not present

## 2018-07-04 DIAGNOSIS — R61 Generalized hyperhidrosis: Secondary | ICD-10-CM | POA: Diagnosis not present

## 2018-07-04 DIAGNOSIS — F430 Acute stress reaction: Secondary | ICD-10-CM

## 2018-07-04 DIAGNOSIS — E1165 Type 2 diabetes mellitus with hyperglycemia: Secondary | ICD-10-CM | POA: Diagnosis not present

## 2018-07-04 DIAGNOSIS — L292 Pruritus vulvae: Secondary | ICD-10-CM

## 2018-07-04 DIAGNOSIS — F411 Generalized anxiety disorder: Secondary | ICD-10-CM

## 2018-07-04 LAB — POCT GLYCOSYLATED HEMOGLOBIN (HGB A1C): HEMOGLOBIN A1C: 8.6 % — AB (ref 4.0–5.6)

## 2018-07-04 MED ORDER — LOSARTAN POTASSIUM 100 MG PO TABS: 100.0000 mg | ORAL_TABLET | Freq: Every day | ORAL | 5 refills | Status: DC

## 2018-07-04 MED ORDER — PRAVASTATIN SODIUM 10 MG PO TABS: 10.0000 mg | ORAL_TABLET | Freq: Every day | ORAL | 5 refills | Status: DC

## 2018-07-04 MED ORDER — ESCITALOPRAM OXALATE 20 MG PO TABS: 20.0000 mg | ORAL_TABLET | Freq: Every day | ORAL | 5 refills | Status: DC

## 2018-07-04 MED ORDER — GLIPIZIDE ER 5 MG PO TB24: 5.0000 mg | ORAL_TABLET | Freq: Every day | ORAL | 2 refills | Status: DC

## 2018-07-04 MED ORDER — METFORMIN HCL 1000 MG PO TABS: ORAL_TABLET | ORAL | 5 refills | Status: DC

## 2018-07-04 MED ORDER — FLUCONAZOLE 150 MG PO TABS: 150.0000 mg | ORAL_TABLET | Freq: Once | ORAL | 0 refills | Status: AC

## 2018-07-04 MED ORDER — DULAGLUTIDE 0.75 MG/0.5ML ~~LOC~~ SOAJ: SUBCUTANEOUS | 5 refills | Status: DC

## 2018-07-04 NOTE — Progress Notes (Addendum)
Subjective:    Patient ID: Susan Becker, female    DOB: 1960-09-26, 57 y.o.   MRN: [id number]  HPI The patient comes in today for a wellness visit.  A review of their health history was completed. A review of medications was also completed.  Any needed refills; none  Eating habits: sometimes, is going to work on making more meals at home  Falls/  MVA accidents in past few months: none  Regular exercise: very little. Did join a gym last night, will work on increasing her exercise.  Specialist pt sees on regular basis: none  Preventative health issues were discussed.   Additional concerns: weight. Excessive sweating during the day, all over has been going on for years, states hair will be dripping with sweat. Has not tried anything for this, does not believe this is r/t hot flashes.   LMP was over 1 year ago. [place] last done in May still not in post-menopausal range. Pt is still on provera daily, denies bleeding. Reports itching daily to labial area, denies discharge. Sexually active, 1 partner.   Results for orders placed or performed in visit on 07/04/18  POCT glycosylated hemoglobin (Hb A1C)  Result Value Ref Range   Hemoglobin A1C 8.6 (A) 4.0 - 5.6 %   HbA1c POC (<> result, manual entry)     HbA1c, POC (prediabetic range)     HbA1c, POC (controlled diabetic range)     Diabetes: Mostly compliant with medications, trulicity weekly, metformin 1000 mg bid, sometimes misses evening dose. Fasting blood sugars over the last month have been running 200-240. Denies hypoglycemia.   HTN: compliant with medication, BP at home runs 120s/80s. Denies adverse effects.  HLD: compliant with medication, no adverse effects.  Anxiety: Mood stable on lexapro. Denies adverse effects. Would like to continue.  Review of Systems  Constitutional: Negative for chills, fatigue, fever and unexpected weight change.  HENT: Negative for congestion, ear pain, sinus pressure, sinus pain and sore throat.    Eyes: Negative for discharge and visual disturbance.  Respiratory: Negative for cough, shortness of breath and wheezing.   Cardiovascular: Negative for chest pain and leg swelling.  Gastrointestinal: Negative for abdominal pain, blood in stool, constipation, diarrhea, nausea and vomiting.  Endocrine: Negative for polydipsia, polyphagia and polyuria.  Genitourinary: Negative for difficulty urinating, hematuria, pelvic pain, vaginal bleeding and vaginal discharge.       Labial itching  Skin:       Excessive sweating  Neurological: Negative for dizziness, weakness, light-headedness, numbness and headaches.  Psychiatric/Behavioral: Negative for dysphoric mood and suicidal ideas.  All other systems reviewed and are negative.      Objective:   Physical Exam  Constitutional: She is oriented to person, place, and time. She appears well-developed and well-nourished. No distress.  HENT:  Head: Normocephalic and atraumatic.  Right Ear: Tympanic membrane normal.  Left Ear: Tympanic membrane normal.  Nose: Nose normal.  Mouth/Throat: Uvula is midline and oropharynx is clear and moist.  Eyes: Pupils are equal, round, and reactive to light. Conjunctivae and EOM are normal. Right eye exhibits no discharge. Left eye exhibits no discharge.  Neck: Neck supple. No thyromegaly present.  Cardiovascular: Normal rate, regular rhythm and normal heart sounds.  No murmur heard. Pulmonary/Chest: Effort normal and breath sounds normal. No respiratory distress. She has no wheezes. Right breast exhibits no inverted nipple, no mass, no nipple discharge, no skin change and no tenderness. Left breast exhibits no inverted nipple, no mass, no nipple discharge,  no skin change and no tenderness.  Abdominal: Soft. Bowel sounds are normal. She exhibits no distension and no mass. There is no tenderness.  Genitourinary: Vagina normal and uterus normal. There is no rash, tenderness, lesion or injury on the right labia. There  is no rash, tenderness, lesion or injury on the left labia. Cervix exhibits no motion tenderness and no discharge. Right adnexum displays no mass and no tenderness. Left adnexum displays no mass and no tenderness.  Genitourinary Comments: Chaperone present. Cervix with what appears to be a small cyst anteriorly, normal pap and negative hpv in 2018  Musculoskeletal: She exhibits no edema or deformity.  Lymphadenopathy:    She has no cervical adenopathy.  Neurological: She is alert and oriented to person, place, and time. Coordination normal.  Skin: Skin is warm and dry.  Psychiatric: She has a normal mood and affect.  Nursing note and vitals reviewed.     Assessment & Plan:  1. Routine general medical examination at a health care facility - Plan: CBC with Differential/Platelet, Basic metabolic panel, Lipid panel, Hepatic function panel, TSH Adult wellness-complete.wellness physical was conducted today. Importance of diet and exercise were discussed in detail.  In addition to this a discussion regarding safety was also covered. We also reviewed over immunizations and gave recommendations regarding current immunization needed for age.  In addition to this additional areas were also touched on including: Preventative health exams needed:  Colonoscopy due in 2021 Mammogram UTD, yearly Pap smear: due in 2023, normal pap with negative HPV in 2018  Patient was advised yearly wellness exam  2. Diabetes mellitus - Plan: POCT glycosylated hemoglobin (Hb A1C), CBC with Differential/Platelet, Basic metabolic panel, Lipid panel, Hepatic function panel, TSH  Diabetes is not under good control with A1c of 8.6 today.  We will continue her current medications with the addition of Glucotrol XL 5 mg daily in the morning.  Discussed with patient the need to check blood sugars and keep a log for her next appointment, discussed random glucose monitoring.  She will notify us in the next couple weeks what her blood  sugars are ranging, and we can titrate her Glucotrol from there.  She will notify us if she has any hypoglycemic episodes.  Lab work ordered.  She will follow-up for recheck on diabetes in 3 months with Dr. Nicki Reaper.  3. Excessive sweating - Plan: CBC with Differential/Platelet  We will check a CBC, if normal may refer to dermatology for further evaluation.  4. Essential hypertension, benign - Plan:  Basic metabolic panel  Blood pressure is under decent control with her current medication.  We will continue medicine, refills given.  Lab work ordered, will notify of results.  Follow-up in 6 months.  5. Hyperlipidemia LDL goal <100 - Plan: Lipid panel, Hepatic function panel  We will check lipid panel and liver function today.  Will continue current medication and notify patient of results.  Follow-up in 6 months.  6. Anxiety  Patient reports mood is stable on current dose of Lexapro she would like to continue this medication.  Refills given.  Follow-up in 6 months.  7. [place] itching  Recommend OTC 1% hydrocortisone cream, may apply twice daily as needed for a few days at a time.  Discussed she should not use this every day.  No significant discharge present on exam however given her uncontrolled diabetes will go ahead and treat presumptively for yeast infection with oral Diflucan x1.  Follow-up if symptoms persist or worsen.  Dr. Mickie Hillier was consulted on this case and is in agreement with the above treatment plan.  25 minutes was spent with the patient.  This statement verifies that 25 minutes was indeed spent with the patient.  More than 50% of this visit-total duration of the visit-was spent in counseling and coordination of care. The issues that the patient came in for today as reflected in the diagnosis (s) please refer to documentation for further details.  ADDENDUM 09/04/18: Pt has history of post-menopausal bleeding in 2017, ultrasound was ordered at that time showed endometrial  thickening and recommended biopsy. Lab work was done that showed [place] not in postmenopausal range so patient was instead placed on Provera. Pt came in at this visit questioning if she needs to continue Provera or not, has not had further bleeding episodes. Discussed with Dr. Sallee Lange, recommend that she go for a repeat ultrasound and possible referral to GYN to manage this. Repeat ultrasound shows some endometrial thickening but could be normal if asymptomatic. Given that no biopsy was done previously feel this will best be managed by GYN. Dr. Nicki Reaper agrees. Referral placed.

## 2018-07-06 LAB — BASIC METABOLIC PANEL
BUN/Creatinine Ratio: 17 (ref 9–23)
BUN: 11 mg/dL (ref 6–24)
CO2: 23 mmol/L (ref 20–29)
CREATININE: 0.63 mg/dL (ref 0.57–1.00)
Calcium: 9.4 mg/dL (ref 8.7–10.2)
Chloride: 99 mmol/L (ref 96–106)
GFR calc Af Amer: 118 mL/min/{1.73_m2} (ref >59)
GFR, EST NON AFRICAN AMERICAN: 102 mL/min/{1.73_m2} (ref >59)
GLUCOSE: 188 mg/dL — AB (ref 65–99)
Potassium: 4.2 mmol/L (ref 3.5–5.2)
SODIUM: 135 mmol/L (ref 134–144)

## 2018-07-06 LAB — CBC WITH DIFFERENTIAL/PLATELET
BASOS ABS: 0 10*3/uL (ref 0.0–0.2)
Basos: 1 %
EOS (ABSOLUTE): 0.1 10*3/uL (ref 0.0–0.4)
EOS: 2 %
HEMATOCRIT: 37.2 % (ref 34.0–46.6)
HEMOGLOBIN: 12.1 g/dL (ref 11.1–15.9)
IMMATURE GRANS (ABS): 0 10*3/uL (ref 0.0–0.1)
Immature Granulocytes: 0 %
LYMPHS ABS: 1.8 10*3/uL (ref 0.7–3.1)
LYMPHS: 25 %
MCH: 28.5 pg (ref 26.6–33.0)
MCHC: 32.5 g/dL (ref 31.5–35.7)
MCV: 88 fL (ref 79–97)
MONOCYTES: 6 %
Monocytes Absolute: 0.5 10*3/uL (ref 0.1–0.9)
NEUTROS ABS: 4.7 10*3/uL (ref 1.4–7.0)
Neutrophils: 66 %
Platelets: 294 10*3/uL (ref 150–450)
RBC: 4.24 x10E6/uL (ref 3.77–5.28)
RDW: 12.8 % (ref 12.3–15.4)
WBC: 7.1 10*3/uL (ref 3.4–10.8)

## 2018-07-06 LAB — HEPATIC FUNCTION PANEL
ALBUMIN: 4.2 g/dL (ref 3.5–5.5)
ALT: 24 IU/L (ref 0–32)
AST: 19 IU/L (ref 0–40)
Alkaline Phosphatase: 85 IU/L (ref 39–117)
BILIRUBIN TOTAL: 0.4 mg/dL (ref 0.0–1.2)
Bilirubin, Direct: 0.11 mg/dL (ref 0.00–0.40)
Total Protein: 7.4 g/dL (ref 6.0–8.5)

## 2018-07-06 LAB — LIPID PANEL
CHOL/HDL RATIO: 4.2 ratio (ref 0.0–4.4)
CHOLESTEROL TOTAL: 185 mg/dL (ref 100–199)
HDL: 44 mg/dL (ref >39)
LDL CALC: 93 mg/dL (ref 0–99)
TRIGLYCERIDES: 242 mg/dL — AB (ref 0–149)
VLDL CHOLESTEROL CAL: 48 mg/dL — AB (ref 5–40)

## 2018-07-06 LAB — TSH: TSH: 2.57 u[IU]/mL (ref 0.450–4.500)

## 2018-07-08 ENCOUNTER — Other Ambulatory Visit: Payer: Self-pay | Admitting: *Deleted

## 2018-07-08 DIAGNOSIS — Z79899 Other long term (current) drug therapy: Secondary | ICD-10-CM

## 2018-07-08 DIAGNOSIS — E785 Hyperlipidemia, unspecified: Secondary | ICD-10-CM

## 2018-07-08 DIAGNOSIS — R9389 Abnormal findings on diagnostic imaging of other specified body structures: Secondary | ICD-10-CM

## 2018-07-08 DIAGNOSIS — E119 Type 2 diabetes mellitus without complications: Secondary | ICD-10-CM

## 2018-07-08 MED ORDER — PRAVASTATIN SODIUM 20 MG PO TABS: 20.0000 mg | ORAL_TABLET | Freq: Every day | ORAL | 0 refills | Status: DC

## 2018-07-16 ENCOUNTER — Ambulatory Visit (HOSPITAL_COMMUNITY): Payer: PRIVATE HEALTH INSURANCE

## 2018-07-16 ENCOUNTER — Other Ambulatory Visit: Payer: Self-pay | Admitting: *Deleted

## 2018-07-17 MED ORDER — MEDROXYPROGESTERONE ACETATE 5 MG PO TABS: 5.0000 mg | ORAL_TABLET | Freq: Every day | ORAL | 0 refills | Status: DC

## 2018-07-17 NOTE — Telephone Encounter (Signed)
Patient recently seen by Ria Comment, please send this to her for her review thank you

## 2018-07-22 ENCOUNTER — Ambulatory Visit (HOSPITAL_COMMUNITY): Admission: RE | Admit: 2018-07-22 | Payer: PRIVATE HEALTH INSURANCE | Source: Ambulatory Visit

## 2018-08-19 ENCOUNTER — Other Ambulatory Visit: Payer: Self-pay

## 2018-08-19 ENCOUNTER — Telehealth: Payer: Self-pay | Admitting: Family Medicine

## 2018-08-19 MED ORDER — MEDROXYPROGESTERONE ACETATE 5 MG PO TABS: 5.0000 mg | ORAL_TABLET | Freq: Every day | ORAL | 0 refills | Status: DC

## 2018-08-19 NOTE — Telephone Encounter (Signed)
Pharmacy requesting refill on Medroxyprogesterone (Provera) 5 mg. Take one tablet by mouth daily.

## 2018-08-19 NOTE — Telephone Encounter (Signed)
Ria Comment You are more intimately involved with this patient's medication for this issue patient seeking refills

## 2018-08-19 NOTE — Telephone Encounter (Signed)
Please call patient, let her know that we will refill this for 30 days, but she needs to either have the ultrasound that was previously ordered done in the next 30 days or we need to refer her to GYN for further evaluation and management. Thanks!

## 2018-08-19 NOTE — Telephone Encounter (Signed)
Contacted patient. Pt states she has scheduled the ultrasound twice but got mixed up on dates. Pt states she will call and schedule the ultrasound tomorrow. Sent in 30 day supply to pharmacy. Pt verbalized understanding

## 2018-09-02 ENCOUNTER — Ambulatory Visit (HOSPITAL_COMMUNITY)
Admission: RE | Admit: 2018-09-02 | Discharge: 2018-09-02 | Disposition: A | Discharge status: Home / Self Care | Payer: PRIVATE HEALTH INSURANCE | Source: Ambulatory Visit | Attending: Family Medicine | Admitting: Family Medicine

## 2018-09-02 DIAGNOSIS — R9389 Abnormal findings on diagnostic imaging of other specified body structures: Secondary | ICD-10-CM | POA: Insufficient documentation

## 2018-09-03 ENCOUNTER — Other Ambulatory Visit: Payer: Self-pay | Admitting: Family Medicine

## 2018-09-03 DIAGNOSIS — R9389 Abnormal findings on diagnostic imaging of other specified body structures: Secondary | ICD-10-CM

## 2018-09-18 ENCOUNTER — Ambulatory Visit: Payer: PRIVATE HEALTH INSURANCE | Admitting: Obstetrics and Gynecology

## 2018-09-18 ENCOUNTER — Other Ambulatory Visit: Payer: Self-pay

## 2018-09-18 ENCOUNTER — Encounter: Payer: Self-pay | Admitting: Obstetrics and Gynecology

## 2018-09-18 ENCOUNTER — Other Ambulatory Visit: Payer: Self-pay | Admitting: Obstetrics and Gynecology

## 2018-09-18 VITALS — BP 159/72 | HR 90 | Ht 66.0 in | Wt 272.0 lb

## 2018-09-18 DIAGNOSIS — R9389 Abnormal findings on diagnostic imaging of other specified body structures: Secondary | ICD-10-CM

## 2018-09-18 MED ORDER — MEDROXYPROGESTERONE ACETATE 5 MG PO TABS: 5.0000 mg | ORAL_TABLET | Freq: Every day | ORAL | 0 refills | Status: DC

## 2018-09-18 NOTE — Progress Notes (Signed)
Patient ID: Susan Becker, female   DOB: 13-Oct-1960, 58 y.o.   MRN: [id number]  ENDOMETRIAL BIOPSY:  Pt had TV u/s that showed: COMPARISON:  February 01, 2016  FINDINGS: Uterus: Measurements: 5.1 x 3.9 x 4.3 cm = volume: 44.7 mL. No fibroids or other mass visualized.  Endometrium: Thickness: 7 mm.  No focal abnormality visualized.  Right ovary: Measurements: 0.8 x 0.6 x 1.3 cm = volume: 0.4 mL. Normal appearance/no adnexal mass.  Left ovary: Measurements: 1.6 x 2.2 x 1.6 cm = volume: 2.8 mL. Normal appearance/no adnexal mass.  Other findings: No abnormal free fluid.  IMPRESSION: 1. The endometrium measures 7 mm in thickness. In a postmenopausal woman with no symptoms, a thickness of up to 8 mm is normal and today's thickness of 7 mm would be of no concern. In the setting of postmenopausal bleeding, a 7 mm thickness would be abnormal and endometrial biopsy would be recommended. Recommend clinical correlation.  Electronically Signed   By: Dorise Bullion III M.D   On: 09/02/2018 17:26   Per patient hasn't had any AUB since she spoke with referring doctor  a year ago  Patient given informed consent, signed copy in the chart, time out was performed. Appropriate time out taken. . The patient was placed in the lithotomy position and the cervix brought into view with sterile speculum.  Portio of cervix cleansed x 2 with betadine swabs.  A tenaculum was placed in the anterior lip of the cervix.  The uterus was sounded for depth of 8 mm. A pipelle was introduced to into the uterus, suction created,  and an endometrial sample was obtained. All equipment was removed and accounted for.  The patient tolerated the procedure well.  Scanty amount of tissue obtained.  Patient given post procedure instructions. The patient will receive results by MyChart, and if concerns found, will f/u in person.   By signing my name below, I, Samul Dada, attest that this documentation has been prepared  under the direction and in the presence of Jonnie Kind, MD. Electronically Signed: Elfrida. 09/18/18. 1:43 PM.  I personally performed the services described in this documentation, which was SCRIBED in my presence. The recorded information has been reviewed and considered accurate. It has been edited as necessary during review. Jonnie Kind, MD

## 2018-09-27 ENCOUNTER — Telehealth: Payer: Self-pay | Admitting: Obstetrics and Gynecology

## 2018-09-27 ENCOUNTER — Telehealth: Payer: Self-pay | Admitting: Family Medicine

## 2018-09-27 NOTE — Telephone Encounter (Signed)
Pt would like to know if Ria Comment recommends patient to continue or D/C medroxyPROGESTERone (PROVERA) 5 MG tablet [[id number]]. Advise.

## 2018-09-27 NOTE — Telephone Encounter (Signed)
Patient advised per Ria Comment: We referred her to GYN for this, they should be the ones to make this decision. Please have her call and ask for their opinion on continuing med or not. Patient stated she will call the GYN office for further instructions.

## 2018-09-27 NOTE — Telephone Encounter (Signed)
Patient called, wants to know if she needs to continue taking her Provera.  [phone]

## 2018-09-27 NOTE — Telephone Encounter (Signed)
We referred her to GYN for this, they should be the ones to make this decision. Please have her call and ask for their opinion on continuing med or not. Thanks!

## 2018-10-02 ENCOUNTER — Telehealth: Payer: Self-pay | Admitting: Obstetrics and Gynecology

## 2018-10-02 NOTE — Telephone Encounter (Signed)
Telephone call completed.  Pt had benign endometrium on recent endometrial biopsy. Pt advised to stop the Provera 5 mg continuous tx. Pt to expect a small amount of bleeding after stopping the provera. Pt to let us know if she does not bleed. Pt will take the provera for 2 weeks every 3 months, as long as she has a withdrawal bleed in response to completing the 2 wks of Provera. Pt to make f/u appt q yr to review management plan.

## 2018-10-02 NOTE — Telephone Encounter (Signed)
See telephone note this date.

## 2018-10-03 ENCOUNTER — Ambulatory Visit: Payer: PRIVATE HEALTH INSURANCE | Admitting: Family Medicine

## 2018-10-14 ENCOUNTER — Other Ambulatory Visit: Payer: Self-pay

## 2018-10-14 MED ORDER — GLIPIZIDE ER 5 MG PO TB24: 5.0000 mg | ORAL_TABLET | Freq: Every day | ORAL | 2 refills | Status: DC

## 2018-10-29 ENCOUNTER — Encounter: Payer: Self-pay | Admitting: Family Medicine

## 2018-10-29 ENCOUNTER — Ambulatory Visit: Payer: PRIVATE HEALTH INSURANCE | Admitting: Family Medicine

## 2018-10-29 VITALS — BP 138/88 | Ht 66.0 in | Wt 273.0 lb

## 2018-10-29 DIAGNOSIS — E119 Type 2 diabetes mellitus without complications: Secondary | ICD-10-CM

## 2018-10-29 DIAGNOSIS — I10 Essential (primary) hypertension: Secondary | ICD-10-CM

## 2018-10-29 DIAGNOSIS — E785 Hyperlipidemia, unspecified: Secondary | ICD-10-CM

## 2018-10-29 DIAGNOSIS — Z79899 Other long term (current) drug therapy: Secondary | ICD-10-CM

## 2018-10-29 DIAGNOSIS — E559 Vitamin D deficiency, unspecified: Secondary | ICD-10-CM

## 2018-10-29 LAB — POCT GLYCOSYLATED HEMOGLOBIN (HGB A1C): HEMOGLOBIN A1C: 7.1 % — AB (ref 4.0–5.6)

## 2018-10-29 MED ORDER — DULAGLUTIDE 1.5 MG/0.5ML ~~LOC~~ SOAJ: SUBCUTANEOUS | 5 refills | Status: DC

## 2018-10-29 MED ORDER — LOSARTAN POTASSIUM 100 MG PO TABS: 100.0000 mg | ORAL_TABLET | Freq: Every day | ORAL | 5 refills | Status: DC

## 2018-10-29 MED ORDER — METFORMIN HCL 1000 MG PO TABS: ORAL_TABLET | ORAL | 5 refills | Status: DC

## 2018-10-29 MED ORDER — ESCITALOPRAM OXALATE 20 MG PO TABS: 20.0000 mg | ORAL_TABLET | Freq: Every day | ORAL | 5 refills | Status: DC

## 2018-10-29 MED ORDER — PRAVASTATIN SODIUM 20 MG PO TABS: 20.0000 mg | ORAL_TABLET | Freq: Every day | ORAL | 5 refills | Status: DC

## 2018-10-29 MED ORDER — GLIPIZIDE ER 5 MG PO TB24: 5.0000 mg | ORAL_TABLET | Freq: Every day | ORAL | 5 refills | Status: DC

## 2018-10-29 NOTE — Progress Notes (Signed)
Subjective:    Patient ID: Susan Becker, female    DOB: [birth year], 58 y.o.   MRN: [id number]  HPI  Patient is here today to follow up on her chronic health issues.  Diabetes:She takes Glipizide 5 mg once per day, Metformin 1000 mg one bid The patient was seen today as part of a comprehensive diabetic check up.the patient does have diabetes.  The patient follows here on a regular basis.  The patient relates medication compliance. No significant side effects to the medications. Denies any low glucose spells. Relates compliance with diet to a reasonable level. Patient does do labwork intermittently and understands the dangers of diabetes.   Hypertension:Losartan 100 mg once per day Patient for blood pressure check up.  The patient does have hypertension.  The patient is on medication.  Patient relates compliance with meds. Todays BP reviewed with the patient. Patient denies issues with medication. Patient relates reasonable diet. Patient tries to minimize salt. Patient aware of BP goals.  Vit D deficiency:Not taking  Anxiety:Lexapro 20 mg one per day Mild anxiety related issues Lexapro seems help that she would like to continue this denies being depressed  Hyperlipidemia:Pravachol 20 mg once per day. Patient here for follow-up regarding cholesterol.  The patient does have hyperlipidemia.  Patient does try to maintain a reasonable diet.  Patient does take the medication on a regular basis.  Denies missing a dose.  The patient denies any obvious side effects.  Prior blood work results reviewed with the patient.  The patient is aware of his cholesterol goals and the need to keep it under good control to lessen the risk of disease.  The patient's BMI is calculated.  The patient does have obesity.  The patient does try to some degree staying active and watching diet.  It is in the vital signs and acknowledged.  It is above the recommended BMI for the patient's height and weight.  The patient has  been counseled regarding healthy diet, restricted portions, avoiding excessive carbohydrates/sugary foods, and increase physical activity as health permits.  It is in the patient's best interest to lower the risk of secondary illness including heart disease strokes and cancer by losing weight.  The patient acknowledges this information.  She is trying to eat better. She is getting very little exercise.  She does not see any specialist.  Results for orders placed or performed in visit on 10/29/18  POCT glycosylated hemoglobin (Hb A1C)  Result Value Ref Range   Hemoglobin A1C 7.1 (A) 4.0 - 5.6 %   HbA1c POC (<> result, manual entry)     HbA1c, POC (prediabetic range)     HbA1c, POC (controlled diabetic range)          Review of Systems  Constitutional: Negative for activity change, appetite change and fatigue.  HENT: Negative for congestion and rhinorrhea.   Respiratory: Negative for cough and shortness of breath.   Cardiovascular: Negative for chest pain and leg swelling.  Gastrointestinal: Negative for abdominal pain and diarrhea.  Endocrine: Negative for polydipsia and polyphagia.  Skin: Negative for color change.  Neurological: Negative for dizziness and weakness.  Psychiatric/Behavioral: Negative for behavioral problems and confusion.       Objective:   Physical Exam Vitals signs reviewed.  Constitutional:      General: She is not in acute distress. HENT:     Head: Normocephalic and atraumatic.  Eyes:     General:        Right eye: No  discharge.        Left eye: No discharge.  Neck:     Trachea: No tracheal deviation.  Cardiovascular:     Rate and Rhythm: Normal rate and regular rhythm.     Heart sounds: Normal heart sounds. No murmur.  Pulmonary:     Effort: Pulmonary effort is normal. No respiratory distress.     Breath sounds: Normal breath sounds.  Lymphadenopathy:     Cervical: No cervical adenopathy.  Skin:    General: Skin is warm and dry.    Neurological:     Mental Status: She is alert.     Coordination: Coordination normal.  Psychiatric:        Behavior: Behavior normal.           Assessment & Plan:  The patient was seen today as part of a comprehensive visit for diabetes. The importance of keeping her A1c at or below 7 was discussed.  Importance of regular physical activity was discussed.   The importance of adherence to medication as well as a controlled low starch/sugar diet was also discussed.  Standard follow-up visit recommended.  Also patient aware failure to keep diabetes under control increases the risk of complications.  Subpar control recommend increasing Trulicity recommend better diet additional information given to the patient follow-up if progressive troubles or worse  .  Morbid obesity very important for the patient to watch portions exercise some try to lose weight stepwise fashion was discussed with the patient  Hyperlipidemia-previous labs reviewed with patient do her best to try to stick with medication.  Check lab work.  Follow-up labs in 3 months recommend follow-up office visit 6 months  Underlying stress related issues doing well currently continue Lexapro  HTN blood pressure on recheck borderline very important for the patient do a better job watching diet  Vitamin D deficiency history should take over-the-counter vitamin D on a regular basis  The patient will do a good job of watching her diet doing better job eating selection staying active taking her medicines checking sugars periodically and sending [place] some readings within the next few weeks   25 minutes was spent with the patient.  This statement verifies that 25 minutes was indeed spent with the patient.  More than 50% of this visit-total duration of the visit-was spent in counseling and coordination of care. The issues that the patient came in for today as reflected in the diagnosis (s) please refer to documentation for further  details.

## 2019-01-03 ENCOUNTER — Encounter: Payer: Self-pay | Admitting: Family Medicine

## 2019-01-03 MED ORDER — BLOOD GLUCOSE MONITOR KIT: PACK | 0 refills | Status: AC

## 2019-01-24 ENCOUNTER — Telehealth: Payer: Self-pay | Admitting: Family Medicine

## 2019-01-24 ENCOUNTER — Other Ambulatory Visit: Payer: Self-pay

## 2019-01-24 MED ORDER — PRAVASTATIN SODIUM 20 MG PO TABS: 20.0000 mg | ORAL_TABLET | Freq: Every day | ORAL | 3 refills | Status: DC

## 2019-01-24 MED ORDER — LOSARTAN POTASSIUM 100 MG PO TABS: 100.0000 mg | ORAL_TABLET | Freq: Every day | ORAL | 3 refills | Status: DC

## 2019-01-24 MED ORDER — ESCITALOPRAM OXALATE 20 MG PO TABS: 20.0000 mg | ORAL_TABLET | Freq: Every day | ORAL | 4 refills | Status: DC

## 2019-01-24 MED ORDER — METFORMIN HCL 1000 MG PO TABS: ORAL_TABLET | ORAL | 3 refills | Status: DC

## 2019-01-24 MED ORDER — GLIPIZIDE ER 5 MG PO TB24: 5.0000 mg | ORAL_TABLET | Freq: Every day | ORAL | 3 refills | Status: DC

## 2019-01-24 NOTE — Telephone Encounter (Signed)
Medications sent to [facility] 

## 2019-01-24 NOTE — Telephone Encounter (Signed)
Four mo worth ok

## 2019-01-24 NOTE — Telephone Encounter (Signed)
Fax from pharmacy requesting refills on losartan 100 mg tablet; Escitalopram 20 mg; Pravastatin 20 mg; Metformin 1000 mg and Glipizide 5 mg tablets. Pt last seen on 10/29/2018 for DM. Please advise. Thank you

## 2019-01-24 NOTE — Addendum Note (Signed)
Addended by: Vicente Males on: 01/24/2019 01:45 PM   Modules accepted: Orders

## 2019-02-25 ENCOUNTER — Other Ambulatory Visit: Payer: Self-pay | Admitting: Family Medicine

## 2019-03-28 ENCOUNTER — Other Ambulatory Visit: Payer: Self-pay | Admitting: Family Medicine

## 2019-03-28 ENCOUNTER — Ambulatory Visit (INDEPENDENT_AMBULATORY_CARE_PROVIDER_SITE_OTHER): Payer: PRIVATE HEALTH INSURANCE | Admitting: Family Medicine

## 2019-03-28 ENCOUNTER — Other Ambulatory Visit (HOSPITAL_COMMUNITY)
Admission: RE | Admit: 2019-03-28 | Discharge: 2019-03-28 | Disposition: A | Discharge status: Home / Self Care | Payer: PRIVATE HEALTH INSURANCE | Source: Ambulatory Visit | Attending: Family Medicine | Admitting: Family Medicine

## 2019-03-28 ENCOUNTER — Other Ambulatory Visit: Payer: Self-pay

## 2019-03-28 ENCOUNTER — Ambulatory Visit (HOSPITAL_COMMUNITY)
Admission: RE | Admit: 2019-03-28 | Discharge: 2019-03-28 | Disposition: A | Discharge status: Home / Self Care | Payer: PRIVATE HEALTH INSURANCE | Source: Ambulatory Visit | Attending: Family Medicine | Admitting: Family Medicine

## 2019-03-28 DIAGNOSIS — R1030 Lower abdominal pain, unspecified: Secondary | ICD-10-CM | POA: Insufficient documentation

## 2019-03-28 LAB — HEPATIC FUNCTION PANEL
ALT: 23 U/L (ref 0–44)
AST: 18 U/L (ref 15–41)
Albumin: 3.9 g/dL (ref 3.5–5.0)
Alkaline Phosphatase: 70 U/L (ref 38–126)
Bilirubin, Direct: 0.1 mg/dL (ref 0.0–0.2)
Indirect Bilirubin: 0.5 mg/dL (ref 0.3–0.9)
Total Bilirubin: 0.6 mg/dL (ref 0.3–1.2)
Total Protein: 7.7 g/dL (ref 6.5–8.1)

## 2019-03-28 LAB — CBC WITH DIFFERENTIAL/PLATELET
Abs Immature Granulocytes: 0.07 10*3/uL (ref 0.00–0.07)
Basophils Absolute: 0 10*3/uL (ref 0.0–0.1)
Basophils Relative: 0 %
Eosinophils Absolute: 0.1 10*3/uL (ref 0.0–0.5)
Eosinophils Relative: 1 %
HCT: 37.4 % (ref 36.0–46.0)
Hemoglobin: 12.1 g/dL (ref 12.0–15.0)
Immature Granulocytes: 1 %
Lymphocytes Relative: 13 %
Lymphs Abs: 1.6 10*3/uL (ref 0.7–4.0)
MCH: 29.6 pg (ref 26.0–34.0)
MCHC: 32.4 g/dL (ref 30.0–36.0)
MCV: 91.4 fL (ref 80.0–100.0)
Monocytes Absolute: 1.1 10*3/uL — ABNORMAL HIGH (ref 0.1–1.0)
Monocytes Relative: 9 %
Neutro Abs: 9.3 10*3/uL — ABNORMAL HIGH (ref 1.7–7.7)
Neutrophils Relative %: 76 %
Platelets: 235 10*3/uL (ref 150–400)
RBC: 4.09 MIL/uL (ref 3.87–5.11)
RDW: 12.5 % (ref 11.5–15.5)
WBC: 12.2 10*3/uL — ABNORMAL HIGH (ref 4.0–10.5)
nRBC: 0 % (ref 0.0–0.2)

## 2019-03-28 LAB — BASIC METABOLIC PANEL
Anion gap: 10 (ref 5–15)
BUN: 9 mg/dL (ref 6–20)
CO2: 26 mmol/L (ref 22–32)
Calcium: 9 mg/dL (ref 8.9–10.3)
Chloride: 99 mmol/L (ref 98–111)
Creatinine, Ser: 0.55 mg/dL (ref 0.44–1.00)
GFR calc Af Amer: >60 mL/min (ref >60)
GFR calc non Af Amer: >60 mL/min (ref >60)
Glucose, Bld: 267 mg/dL — ABNORMAL HIGH (ref 70–99)
Potassium: 3.8 mmol/L (ref 3.5–5.1)
Sodium: 135 mmol/L (ref 135–145)

## 2019-03-28 LAB — LIPASE, BLOOD: Lipase: 24 U/L (ref 11–51)

## 2019-03-28 MED ORDER — CIPROFLOXACIN HCL 500 MG PO TABS: ORAL_TABLET | ORAL | 0 refills | Status: DC

## 2019-03-28 MED ORDER — IOHEXOL 300 MG/ML  SOLN
100.0000 mL | Freq: Once | INTRAMUSCULAR | Status: AC | PRN
  Administered 2019-03-28: 30 mL via INTRAVENOUS

## 2019-03-28 MED ORDER — FLUCONAZOLE 150 MG PO TABS: ORAL_TABLET | ORAL | 0 refills | Status: DC

## 2019-03-28 MED ORDER — METRONIDAZOLE 500 MG PO TABS: ORAL_TABLET | ORAL | 0 refills | Status: DC

## 2019-03-28 MED ORDER — ONDANSETRON 4 MG PO TBDP: ORAL_TABLET | ORAL | 0 refills | Status: DC

## 2019-03-28 NOTE — Progress Notes (Signed)
Subjective:    Patient ID: Susan Becker, female    DOB: 1961/07/26, 58 y.o.   MRN: [id number]  HPI So at first it was felt that this would be a virtual visit but patient was having lower abdominal pain so patient was brought into the office to be seen she is not having any COVID symptoms no fever chills cough. Patient calls with abdominal pain and pressure with nausea for 3-4 days. No vomiting yet. Patient has had some nausea she is also had lower abdominal pain she had a colonoscopy back 2016 it did show diverticuli in the sigmoid colon she denies any vaginal discharge denies dysuria or urinary frequency denies fever chills but states the pain is persistent hurts with movement hurts sitting still even hurts when she is lying down at night appetite decreased Virtual Visit via Video Note  I connected with Susan Becker on 03/28/19 at 10:30 AM EDT by a video enabled telemedicine application and verified that I am speaking with the correct person using two identifiers.  Location: Patient: home Provider: office   I discussed the limitations of evaluation and management by telemedicine and the availability of in person appointments. The patient expressed understanding and agreed to proceed.  History of Present Illness:    Observations/Objective:   Assessment and Plan:   Follow Up Instructions:    I discussed the assessment and treatment plan with the patient. The patient was provided an opportunity to ask questions and all were answered. The patient agreed with the plan and demonstrated an understanding of the instructions.   The patient was advised to call back or seek an in-person evaluation if the symptoms worsen or if the condition fails to improve as anticipated.  I provided 25 minutes of non-face-to-face time during this encounter.      Review of Systems  Constitutional: Negative for activity change, appetite change and fatigue.  HENT: Negative for congestion and  rhinorrhea.   Respiratory: Negative for cough and shortness of breath.   Cardiovascular: Negative for chest pain and leg swelling.  Gastrointestinal: Positive for abdominal pain. Negative for constipation and diarrhea.  Endocrine: Negative for polydipsia and polyphagia.  Skin: Negative for color change.  Neurological: Negative for dizziness and weakness.  Psychiatric/Behavioral: Negative for behavioral problems and confusion.       Objective:   Physical Exam Vitals signs reviewed.  Constitutional:      General: She is not in acute distress. HENT:     Head: Normocephalic and atraumatic.  Eyes:     General:        Right eye: No discharge.        Left eye: No discharge.  Neck:     Trachea: No tracheal deviation.  Cardiovascular:     Rate and Rhythm: Normal rate and regular rhythm.     Heart sounds: Normal heart sounds. No murmur.  Pulmonary:     Effort: Pulmonary effort is normal. No respiratory distress.     Breath sounds: Normal breath sounds.  Abdominal:     General: Bowel sounds are normal.     Palpations: Abdomen is soft. There is no mass.     Tenderness: There is abdominal tenderness in the right lower quadrant, suprapubic area and left lower quadrant. There is no guarding or rebound.  Lymphadenopathy:     Cervical: No cervical adenopathy.  Skin:    General: Skin is warm and dry.  Neurological:     Mental Status: She is alert.  Coordination: Coordination normal.  Psychiatric:        Behavior: Behavior normal.    Significant lower abdominal tenderness all the way across from right to the left moderate to severe tenderness Her pain is worse on the right side with significant tenderness in the right lower quadrant      Assessment & Plan:  Unfortunately cannot pinpoint the issue based on physical exam alone The patient will need lab work Stat Also will need stat CT of the abdomen pelvis We are doing a scan to make sure that she does not have an abscess or  appendicitis or diverticulitis Warning signs were discussed with the patient If high fevers chills or sweats to follow-up immediately Hopefully CAT scan will reveal the issue of what is going on May need to be placed on antibiotics certainly if a surgical issue is seen she will need referral to the ER for surgical evaluation

## 2019-03-28 NOTE — Addendum Note (Signed)
Addended by: Dairl Ponder on: 03/28/2019 11:21 AM   Modules accepted: Orders

## 2019-03-28 NOTE — Addendum Note (Signed)
Addended by: Vicente Males on: 03/28/2019 04:53 PM   Modules accepted: Orders

## 2019-03-30 NOTE — Telephone Encounter (Signed)
May have 1 month with 2 months of refill will need diabetic follow-up this fall

## 2019-04-15 ENCOUNTER — Other Ambulatory Visit (HOSPITAL_COMMUNITY): Payer: Self-pay | Admitting: Family Medicine

## 2019-04-15 DIAGNOSIS — Z1231 Encounter for screening mammogram for malignant neoplasm of breast: Secondary | ICD-10-CM

## 2019-04-29 ENCOUNTER — Other Ambulatory Visit: Payer: Self-pay | Admitting: *Deleted

## 2019-04-29 ENCOUNTER — Telehealth: Payer: Self-pay | Admitting: Family Medicine

## 2019-04-29 MED ORDER — LOSARTAN POTASSIUM 100 MG PO TABS: 100.0000 mg | ORAL_TABLET | Freq: Every day | ORAL | 0 refills | Status: DC

## 2019-04-29 NOTE — Telephone Encounter (Signed)
Refill sent per dr Nicki Reaper one month and needs visit. Left infor on her voicmail to call back to schedule visit and one month supply of med was sent to [facility] [address].

## 2019-04-29 NOTE — Telephone Encounter (Signed)
Refill sent to pharm. Pt notified on voicemail.

## 2019-04-29 NOTE — Telephone Encounter (Signed)
Pt needs refill on her losartan (COZAAR) 100 MG tablet  Pt has contacted [facility] multiple times over the 2 weeks & we have not received a request  Pt is completely out of her medicine for the past 2 weeks & is leaving to go out of town in about an hour for the rest of the week.  Can we refill urgently?  Please advise & call pt  (pt tried to call here this morning but our outgoing message still said we were closed for the holiday)     [address]

## 2019-04-29 NOTE — Telephone Encounter (Signed)
Last med check 9/8

## 2019-05-01 ENCOUNTER — Ambulatory Visit: Payer: PRIVATE HEALTH INSURANCE | Admitting: Family Medicine

## 2019-05-09 LAB — BASIC METABOLIC PANEL
BUN/Creatinine Ratio: 16 (ref 9–23)
BUN: 9 mg/dL (ref 6–24)
CO2: 23 mmol/L (ref 20–29)
Calcium: 9.3 mg/dL (ref 8.7–10.2)
Chloride: 99 mmol/L (ref 96–106)
Creatinine, Ser: 0.58 mg/dL (ref 0.57–1.00)
GFR calc Af Amer: 120 mL/min/{1.73_m2} (ref >59)
GFR calc non Af Amer: 104 mL/min/{1.73_m2} (ref >59)
Glucose: 209 mg/dL — ABNORMAL HIGH (ref 65–99)
Potassium: 4.6 mmol/L (ref 3.5–5.2)
Sodium: 137 mmol/L (ref 134–144)

## 2019-05-09 LAB — LIPID PANEL
Chol/HDL Ratio: 4.7 ratio — ABNORMAL HIGH (ref 0.0–4.4)
Cholesterol, Total: 194 mg/dL (ref 100–199)
HDL: 41 mg/dL (ref >39)
LDL Chol Calc (NIH): 103 mg/dL — ABNORMAL HIGH (ref 0–99)
Triglycerides: 297 mg/dL — ABNORMAL HIGH (ref 0–149)
VLDL Cholesterol Cal: 50 mg/dL — ABNORMAL HIGH (ref 5–40)

## 2019-05-09 LAB — HEPATIC FUNCTION PANEL
ALT: 23 IU/L (ref 0–32)
AST: 20 IU/L (ref 0–40)
Albumin: 4.3 g/dL (ref 3.8–4.9)
Alkaline Phosphatase: 73 IU/L (ref 39–117)
Bilirubin Total: 0.3 mg/dL (ref 0.0–1.2)
Bilirubin, Direct: 0.09 mg/dL (ref 0.00–0.40)
Total Protein: 7 g/dL (ref 6.0–8.5)

## 2019-05-09 LAB — HEMOGLOBIN A1C
Est. average glucose Bld gHb Est-mCnc: 194 mg/dL
Hgb A1c MFr Bld: 8.4 % — ABNORMAL HIGH (ref 4.8–5.6)

## 2019-05-09 LAB — MICROALBUMIN / CREATININE URINE RATIO
Creatinine, Urine: 138.2 mg/dL
Microalb/Creat Ratio: 47 mg/g creat — ABNORMAL HIGH (ref 0–29)
Microalbumin, Urine: 65.5 ug/mL

## 2019-05-15 ENCOUNTER — Ambulatory Visit (INDEPENDENT_AMBULATORY_CARE_PROVIDER_SITE_OTHER): Payer: PRIVATE HEALTH INSURANCE | Admitting: Family Medicine

## 2019-05-15 ENCOUNTER — Other Ambulatory Visit: Payer: Self-pay

## 2019-05-15 DIAGNOSIS — I10 Essential (primary) hypertension: Secondary | ICD-10-CM | POA: Diagnosis not present

## 2019-05-15 DIAGNOSIS — E1169 Type 2 diabetes mellitus with other specified complication: Secondary | ICD-10-CM

## 2019-05-15 DIAGNOSIS — E119 Type 2 diabetes mellitus without complications: Secondary | ICD-10-CM

## 2019-05-15 DIAGNOSIS — E785 Hyperlipidemia, unspecified: Secondary | ICD-10-CM | POA: Diagnosis not present

## 2019-05-15 DIAGNOSIS — R197 Diarrhea, unspecified: Secondary | ICD-10-CM

## 2019-05-15 MED ORDER — ESCITALOPRAM OXALATE 20 MG PO TABS: 20.0000 mg | ORAL_TABLET | Freq: Every day | ORAL | 1 refills | Status: DC

## 2019-05-15 MED ORDER — PRAVASTATIN SODIUM 40 MG PO TABS: 40.0000 mg | ORAL_TABLET | Freq: Every day | ORAL | 1 refills | Status: DC

## 2019-05-15 MED ORDER — LOSARTAN POTASSIUM 100 MG PO TABS: 100.0000 mg | ORAL_TABLET | Freq: Every day | ORAL | 1 refills | Status: DC

## 2019-05-15 MED ORDER — GLIPIZIDE ER 5 MG PO TB24: 5.0000 mg | ORAL_TABLET | Freq: Every day | ORAL | 1 refills | Status: DC

## 2019-05-15 MED ORDER — METFORMIN HCL 1000 MG PO TABS: ORAL_TABLET | ORAL | 1 refills | Status: DC

## 2019-05-15 MED ORDER — TRULICITY 0.75 MG/0.5ML ~~LOC~~ SOAJ: SUBCUTANEOUS | 2 refills | Status: DC

## 2019-05-15 NOTE — Progress Notes (Signed)
Subjective:    Patient ID: Susan Becker, female    DOB: 22-Feb-1961, 58 y.o.   MRN: [id number]  Diabetes She presents for her follow-up diabetic visit. She has type 2 diabetes mellitus. Pertinent negatives for hypoglycemia include no confusion or dizziness. Pertinent negatives for diabetes include no chest pain, no fatigue, no polydipsia, no polyphagia and no weakness. Risk factors for coronary artery disease include diabetes mellitus, dyslipidemia, hypertension and post-menopausal. Current diabetic treatment includes oral agent (triple therapy). Her weight is stable. She is following a diabetic diet.   Patient does relate that diabetes not under good control trying to do the best she can try to get that under better control may well need adjustment medicine  Patient is taking her cholesterol medicine previous labs reviewed LDL but above goal very important watch diet and bump up the dose of the medicine  Blood pressure she feels is under good control  She does take her medicine on a regular basis for the blood pressure.  Morbid obesity she is trying to watch her diet trying exercise try to lose weight very difficult for her to do so  Diarrhea she was on antibiotics for diverticulitis this is doing better but now having frequent diarrhea not bloody not mucousy Needs refill of trulicity Results for orders placed or performed during the hospital encounter of 03/28/19  CBC with Differential/Platelet  Result Value Ref Range   WBC 12.2 (H) 4.0 - 10.5 K/uL   RBC 4.09 3.87 - 5.11 MIL/uL   Hemoglobin 12.1 12.0 - 15.0 g/dL   HCT 37.4 36.0 - 46.0 %   MCV 91.4 80.0 - 100.0 fL   MCH 29.6 26.0 - 34.0 pg   MCHC 32.4 30.0 - 36.0 g/dL   RDW 12.5 11.5 - 15.5 %   Platelets 235 150 - 400 K/uL   nRBC 0.0 0.0 - 0.2 %   Neutrophils Relative % 76 %   Neutro Abs 9.3 (H) 1.7 - 7.7 K/uL   Lymphocytes Relative 13 %   Lymphs Abs 1.6 0.7 - 4.0 K/uL   Monocytes Relative 9 %   Monocytes Absolute 1.1 (H) 0.1  - 1.0 K/uL   Eosinophils Relative 1 %   Eosinophils Absolute 0.1 0.0 - 0.5 K/uL   Basophils Relative 0 %   Basophils Absolute 0.0 0.0 - 0.1 K/uL   Immature Granulocytes 1 %   Abs Immature Granulocytes 0.07 0.00 - 0.07 K/uL  Hepatic function panel  Result Value Ref Range   Total Protein 7.7 6.5 - 8.1 g/dL   Albumin 3.9 3.5 - 5.0 g/dL   AST 18 15 - 41 U/L   ALT 23 0 - 44 U/L   Alkaline Phosphatase 70 38 - 126 U/L   Total Bilirubin 0.6 0.3 - 1.2 mg/dL   Bilirubin, Direct 0.1 0.0 - 0.2 mg/dL   Indirect Bilirubin 0.5 0.3 - 0.9 mg/dL  Basic metabolic panel  Result Value Ref Range   Sodium 135 135 - 145 mmol/L   Potassium 3.8 3.5 - 5.1 mmol/L   Chloride 99 98 - 111 mmol/L   CO2 26 22 - 32 mmol/L   Glucose, Bld 267 (H) 70 - 99 mg/dL   BUN 9 6 - 20 mg/dL   Creatinine, Ser 0.55 0.44 - 1.00 mg/dL   Calcium 9.0 8.9 - 10.3 mg/dL   GFR calc non Af Amer >60 >60 mL/min   GFR calc Af Amer >60 >60 mL/min   Anion gap 10 5 - 15  Lipase, blood  Result Value Ref Range   Lipase 24 11 - 51 U/L     Review of Systems  Constitutional: Negative for activity change, appetite change and fatigue.  HENT: Negative for congestion and rhinorrhea.   Respiratory: Negative for cough and shortness of breath.   Cardiovascular: Negative for chest pain and leg swelling.  Gastrointestinal: Positive for diarrhea. Negative for abdominal pain, blood in stool, constipation, nausea and rectal pain.  Endocrine: Negative for polydipsia and polyphagia.  Skin: Negative for color change.  Neurological: Negative for dizziness and weakness.  Psychiatric/Behavioral: Negative for behavioral problems and confusion.   Virtual Visit via Video Note  I connected with Susan Becker on 05/15/19 at  8:30 AM EDT by a video enabled telemedicine application and verified that I am speaking with the correct person using two identifiers.  Location: Patient: home Provider: office   I discussed the limitations of evaluation and  management by telemedicine and the availability of in person appointments. The patient expressed understanding and agreed to proceed.  History of Present Illness:    Observations/Objective:   Assessment and Plan:   Follow Up Instructions:    I discussed the assessment and treatment plan with the patient. The patient was provided an opportunity to ask questions and all were answered. The patient agreed with the plan and demonstrated an understanding of the instructions.   The patient was advised to call back or seek an in-person evaluation if the symptoms worsen or if the condition fails to improve as anticipated.  I provided 25 minutes of non-face-to-face time during this encounter.        Objective:   Physical Exam   Patient had virtual visit Appears to be in no distress Atraumatic Neuro able to relate and oriented No apparent resp distress Color normal      Assessment & Plan:  1. Essential hypertension, benign Patient relates blood pressure doing well taking her medicine and trying to watch salt in her diet tries to stay active  2. Type 2 diabetes mellitus without complication, without long-term current use of insulin (HCC) Diabetes subpar control improved the Trulicity next step would be 1.5 daily patient will send [place] glucose readings in approximately 3 to 4 weeks we will relook at A1c in January - Basic metabolic panel - Hemoglobin A1c  3. Hyperlipidemia LDL goal <100 Cholesterol not at goal very important to get this under better control bump up the Pravachol very important - Lipid panel  4. Morbid obesity ([place]) Patient is trying to work on her weight trying to watch diet  5. Hyperlipidemia associated with type 2 diabetes mellitus ([place]) Very important to get LDL below 70 will follow-up with lab work again in approximately 4 months  6. Diarrhea, unspecified type Stool culture very important that if this is negative may need GI consultation - Stool Culture  - Clostridium Difficile by PCR

## 2019-05-21 LAB — CLOSTRIDIUM DIFFICILE BY PCR: Toxigenic C. Difficile by PCR: POSITIVE — AB

## 2019-05-22 MED ORDER — VANCOMYCIN HCL 125 MG PO CAPS: ORAL_CAPSULE | ORAL | 0 refills | Status: DC

## 2019-05-22 NOTE — Addendum Note (Signed)
Addended by: Vicente Males on: 05/22/2019 09:22 AM   Modules accepted: Orders

## 2019-05-24 LAB — STOOL CULTURE: E coli, Shiga toxin Assay: NEGATIVE

## 2019-05-26 ENCOUNTER — Other Ambulatory Visit: Payer: Self-pay | Admitting: Family Medicine

## 2019-06-02 ENCOUNTER — Ambulatory Visit (HOSPITAL_COMMUNITY)
Admission: RE | Admit: 2019-06-02 | Discharge: 2019-06-02 | Disposition: A | Discharge status: Home / Self Care | Payer: PRIVATE HEALTH INSURANCE | Source: Ambulatory Visit | Attending: Family Medicine | Admitting: Family Medicine

## 2019-06-02 ENCOUNTER — Other Ambulatory Visit: Payer: Self-pay

## 2019-06-02 DIAGNOSIS — Z1231 Encounter for screening mammogram for malignant neoplasm of breast: Secondary | ICD-10-CM | POA: Diagnosis present

## 2019-06-11 ENCOUNTER — Other Ambulatory Visit: Payer: Self-pay | Admitting: Family Medicine

## 2019-06-24 ENCOUNTER — Encounter: Payer: Self-pay | Admitting: Family Medicine

## 2019-06-25 MED ORDER — TRULICITY 1.5 MG/0.5ML ~~LOC~~ SOAJ: SUBCUTANEOUS | 2 refills | Status: DC

## 2019-07-11 ENCOUNTER — Encounter: Payer: Self-pay | Admitting: Nurse Practitioner

## 2019-07-11 ENCOUNTER — Ambulatory Visit (INDEPENDENT_AMBULATORY_CARE_PROVIDER_SITE_OTHER): Payer: PRIVATE HEALTH INSURANCE | Admitting: Nurse Practitioner

## 2019-07-11 ENCOUNTER — Other Ambulatory Visit: Payer: Self-pay

## 2019-07-11 VITALS — BP 130/84 | Temp 97.1°F | Ht 65.0 in | Wt 268.6 lb

## 2019-07-11 DIAGNOSIS — Z01419 Encounter for gynecological examination (general) (routine) without abnormal findings: Secondary | ICD-10-CM

## 2019-07-11 DIAGNOSIS — Z124 Encounter for screening for malignant neoplasm of cervix: Secondary | ICD-10-CM | POA: Diagnosis not present

## 2019-07-11 DIAGNOSIS — E119 Type 2 diabetes mellitus without complications: Secondary | ICD-10-CM

## 2019-07-11 DIAGNOSIS — E785 Hyperlipidemia, unspecified: Secondary | ICD-10-CM

## 2019-07-11 DIAGNOSIS — Z0000 Encounter for general adult medical examination without abnormal findings: Secondary | ICD-10-CM

## 2019-07-11 DIAGNOSIS — I10 Essential (primary) hypertension: Secondary | ICD-10-CM

## 2019-07-11 DIAGNOSIS — Z1151 Encounter for screening for human papillomavirus (HPV): Secondary | ICD-10-CM

## 2019-07-11 DIAGNOSIS — K219 Gastro-esophageal reflux disease without esophagitis: Secondary | ICD-10-CM

## 2019-07-11 MED ORDER — FAMOTIDINE 20 MG PO TABS: 20.0000 mg | ORAL_TABLET | Freq: Every day | ORAL | 5 refills | Status: DC

## 2019-07-11 NOTE — Progress Notes (Signed)
Subjective:    Patient ID: Susan Becker, female    DOB: 04-24-1961, 58 y.o.   MRN: [id number]  HPI The patient comes in today for a wellness visit. Pt is trying to exercise more, walks occasionally. Pt has reduce intake of sugars, drinking diet sodas and unsweetened teas. Pt is trying to reduce intake of carbohydrates, such as pasta and breads. Pt sleeps well. Pt having some issues with heartburn and nausea. Pt was taking ranitidine, however it was discontinued. No issues taking that medication.Pt started Trulicity in March. Recent dose increase. Regular eye and dental care. Pt received flu vaccine. Pt is in a monogamous relationship.   A review of their health history was completed.  A review of medications was also completed.  Any needed refills; none at this time  Falls/  MVA accidents in past few months: none  Specialist pt sees on regular basis:none  Preventative health issues were discussed.    Depression screen [facility] 2/9 07/11/2019 09/18/2018 07/04/2018 06/22/2017  Decreased Interest 0 0 0 0  Down, Depressed, Hopeless 0 0 0 0  PHQ - 2 Score 0 0 0 0  Altered sleeping - - 0 -  Tired, decreased energy - - 1 -  Change in appetite - - 1 -  Feeling bad or failure about yourself  - - 0 -  Trouble concentrating - - 0 -  Moving slowly or fidgety/restless - - 0 -  Suicidal thoughts - - 0 -  PHQ-9 Score - - 2 -  Difficult doing work/chores - - Not difficult at all -    Review of Systems  Constitutional: Negative for activity change, appetite change, chills and fever.  HENT: Negative for ear pain, sinus pain and sneezing.   Respiratory: Negative for cough, shortness of breath and wheezing.   Cardiovascular: Negative for chest pain, palpitations and leg swelling.  Gastrointestinal: Negative for abdominal pain, blood in stool, constipation, diarrhea and vomiting.  Genitourinary: Negative for difficulty urinating, dysuria, frequency, pelvic pain, vaginal bleeding and vaginal  discharge.  Musculoskeletal: Negative for back pain and myalgias.  Neurological: Negative for dizziness, syncope, weakness, light-headedness and numbness.  Psychiatric/Behavioral: Negative for confusion and sleep disturbance.       Objective:   Physical Exam Exam conducted with a chaperone present.  Constitutional:      Appearance: Normal appearance.  Neck:     Thyroid: No thyroid mass or thyromegaly.  Cardiovascular:     Rate and Rhythm: Normal rate and regular rhythm.     Heart sounds: No murmur.  Pulmonary:     Effort: No respiratory distress.     Breath sounds: Normal breath sounds. No stridor. No wheezing or rhonchi.  Chest:     Chest wall: No mass, lacerations, swelling or tenderness.     Breasts:        Right: Normal. No swelling, bleeding, inverted nipple or mass.        Left: Normal. No swelling, bleeding, inverted nipple or mass.  Abdominal:     General: Abdomen is flat.     Palpations: There is no mass.     Tenderness: There is no abdominal tenderness. There is no rebound.     Hernia: No hernia is present.  Genitourinary:    Exam position: Lithotomy position.     Comments: External: No rashes or lesions. Vagina pink. Internal: No CMT. No discharge or bleeding. No masses or tenderness during bimanual exam. Exam limited due to abdominal girth. Lymphadenopathy:  Upper Body:     Right upper body: No supraclavicular, axillary or pectoral adenopathy.     Left upper body: No supraclavicular, axillary or pectoral adenopathy.  Skin:    General: Skin is warm and dry.  Neurological:     Mental Status: She is alert and oriented to person, place, and time.  Psychiatric:        Mood and Affect: Mood normal.        Behavior: Behavior normal.           Assessment & Plan:   Problem List Items Addressed This Visit      Cardiovascular and Mediastinum   Essential hypertension, benign   Relevant Orders   Hepatic function panel   Hemoglobin A1c   Lipid Profile      Endocrine   Type 2 diabetes mellitus ([place])   Diabetes mellitus without complication ([place])   Relevant Orders   Hepatic function panel   Hemoglobin A1c   Lipid Profile     Other   Morbid obesity ([place])    Other Visit Diagnoses    Well woman exam    -  Primary   Relevant Orders   Pap IG and HPV (high risk) DNA detection   Hepatic function panel   Hemoglobin A1c   Lipid Profile   Screening for cervical cancer       Relevant Orders   Pap IG and HPV (high risk) DNA detection   Hepatic function panel   Hemoglobin A1c   Lipid Profile   Screening for HPV (human papillomavirus)       Relevant Orders   Pap IG and HPV (high risk) DNA detection   Hepatic function panel   Hemoglobin A1c   Lipid Profile   Hyperlipidemia LDL goal <100       Relevant Orders   Hepatic function panel   Hemoglobin A1c   Lipid Profile   Gastroesophageal reflux disease, unspecified whether esophagitis present         Meds ordered this encounter  Medications  . famotidine (PEPCID) 20 MG tablet    Sig: Take 1 tablet (20 mg total) by mouth daily. Prn acid reflux    Dispense:  30 tablet    Refill:  5    Order Specific Question:   Supervising Provider    Answer:   Sallee Lange A [[id number]]    -Recommend dietary and lifestyle modification. Encouraged one dietary and one lifestyle goal. Pt will try to increase daily activity through walking and reduce pasta intake. Reviewed with pt lab work done in September. Pt working on improving lipids and hemoglobin a1c through diet, exercise, and medication. Discussed weight loss options, such as bariatric surgery to help promote weight loss. Acid reflux and nausea could be result of Trulicity.Recommend stop eating 3 hours prior to bed and eating smaller meal sizes. Colonoscopy and mammogram up to date. Plan to see in one month to assess lab work, including hemoglobin a1c, lipids, and liver.   Return in about 5 weeks (around 08/17/2019) for follow up diabetes and  hypertension.

## 2019-07-11 NOTE — Progress Notes (Signed)
   Subjective:    Patient ID: Susan Becker, female    DOB: 1961-04-07, 58 y.o.   MRN: [id number]  HPI The patient comes in today for a wellness visit.    A review of their health history was completed.  A review of medications was also completed.  Any needed refills; none at this time  Eating habits: tries to be healthy  Falls/  MVA accidents in past few months: none  Regular exercise: tries to   Specialist pt sees on regular basis:none  Preventative health issues were discussed.   Additional concerns:    Review of Systems     Objective:   Physical Exam        Assessment & Plan:

## 2019-07-14 ENCOUNTER — Encounter: Payer: Self-pay | Admitting: Family Medicine

## 2019-07-19 LAB — PAP IG AND HPV HIGH-RISK: HPV, high-risk: NEGATIVE

## 2019-08-07 ENCOUNTER — Ambulatory Visit (INDEPENDENT_AMBULATORY_CARE_PROVIDER_SITE_OTHER): Payer: PRIVATE HEALTH INSURANCE | Admitting: Family Medicine

## 2019-08-07 DIAGNOSIS — Z20828 Contact with and (suspected) exposure to other viral communicable diseases: Secondary | ICD-10-CM | POA: Diagnosis not present

## 2019-08-07 DIAGNOSIS — Z20822 Contact with and (suspected) exposure to covid-19: Secondary | ICD-10-CM

## 2019-08-07 DIAGNOSIS — J019 Acute sinusitis, unspecified: Secondary | ICD-10-CM | POA: Diagnosis not present

## 2019-08-07 MED ORDER — PRAVASTATIN SODIUM 20 MG PO TABS: 20.0000 mg | ORAL_TABLET | Freq: Every day | ORAL | 1 refills | Status: DC

## 2019-08-07 MED ORDER — AMOXICILLIN 500 MG PO CAPS: 500.0000 mg | ORAL_CAPSULE | Freq: Three times a day (TID) | ORAL | 0 refills | Status: DC

## 2019-08-07 NOTE — Progress Notes (Signed)
   Subjective:    Patient ID: Susan Becker, female    DOB: 1960-10-25, 58 y.o.   MRN: [id number] Started Tuesday Sinusitis This is a new problem. Episode onset: 4 -5 days. There has been no fever. Associated symptoms include congestion and coughing. Pertinent negatives include no ear pain or shortness of breath. (Cough, congestion, scratchy throat) Past treatments include nothing.   When checking med list pt had pravastatin 40mg  and 20mg  on list. Pt states she is taking 40mg  but wants to go back to 20mg  due to muscle aches.  Patient relates body aches head congestion drainage coughing symptoms over the past several days no wheezing or difficulty breathing Virtual Visit via Telephone Note  I connected with Susan Becker on 08/07/19 at  3:00 PM EST by telephone and verified that I am speaking with the correct person using two identifiers.  Location: Patient: home Provider: office   I discussed the limitations, risks, security and privacy concerns of performing an evaluation and management service by telephone and the availability of in person appointments. I also discussed with the patient that there may be a patient responsible charge related to this service. The patient expressed understanding and agreed to proceed.   History of Present Illness:    Observations/Objective:   Assessment and Plan:   Follow Up Instructions:    I discussed the assessment and treatment plan with the patient. The patient was provided an opportunity to ask questions and all were answered. The patient agreed with the plan and demonstrated an understanding of the instructions.   The patient was advised to call back or seek an in-person evaluation if the symptoms worsen or if the condition fails to improve as anticipated.  I provided 17 minutes of non-face-to-face time during this encounter.      Review of Systems  Constitutional: Negative for activity change and fever.  HENT: Positive for  congestion and rhinorrhea. Negative for ear pain.   Eyes: Negative for discharge.  Respiratory: Positive for cough. Negative for shortness of breath and wheezing.   Cardiovascular: Negative for chest pain.       Objective:   Physical Exam   Today's visit was via telephone Physical exam was not possible for this visit      Assessment & Plan:  Probable Covid Recommend testing We were able to secure her testing spot tomorrow morning at 830 Patient to stay self quarantine  Possible sinusitis antibiotic as prescribed warning signs discussed follow-up if progressive troubles

## 2019-08-08 ENCOUNTER — Other Ambulatory Visit: Payer: Self-pay

## 2019-08-08 ENCOUNTER — Ambulatory Visit: Payer: PRIVATE HEALTH INSURANCE | Attending: Internal Medicine

## 2019-08-08 DIAGNOSIS — Z20822 Contact with and (suspected) exposure to covid-19: Secondary | ICD-10-CM

## 2019-08-09 LAB — NOVEL CORONAVIRUS, NAA: SARS-CoV-2, NAA: DETECTED — AB

## 2019-08-10 ENCOUNTER — Telehealth: Payer: Self-pay | Admitting: Unknown Physician Specialty

## 2019-08-10 NOTE — Telephone Encounter (Signed)
Discussed with patient about Covid symptoms and the use of bamlanivimab, a monoclonal antibody infusion for those with mild to moderate Covid symptoms and at a high risk of hospitalization.  Pt is qualified for this infusion at the [facility] infusion center due to Diabetes   She will think about it.

## 2019-08-11 ENCOUNTER — Telehealth: Payer: Self-pay | Admitting: Unknown Physician Specialty

## 2019-08-11 ENCOUNTER — Other Ambulatory Visit: Payer: Self-pay | Admitting: Unknown Physician Specialty

## 2019-08-11 ENCOUNTER — Telehealth: Payer: Self-pay | Admitting: *Deleted

## 2019-08-11 DIAGNOSIS — U071 COVID-19: Secondary | ICD-10-CM

## 2019-08-11 NOTE — Telephone Encounter (Signed)
Discussed with pt and she states she has the number to call to set up infusion. She was told when they called to call them back after she decided. She states she will call today and I told her to let us know if she has any trouble setting up the infusion. She verbalized understanding.

## 2019-08-11 NOTE — Telephone Encounter (Signed)
Sorry, dr scott's

## 2019-08-11 NOTE — Telephone Encounter (Signed)
Currently right now Individuals who are at higher risk of Covid complications are recommended for monoclonal antibody infusions.  Essentially monoclonal antibodies are given to help reduce the Covid virus within a person system.  It has been shown to lessen the severity of Covid and lessen the risk of complications. It is not standard treatment for all individuals who have Covid only those who have increased risk factors. This is something we would recommend.  There is nothing to live within the monoclonal antibody infusions. The antibodies infusion actually helps her immune system clear the Covid virus from our system quicker. (So she was called by the infusion clinic-did they give her a phone number to call back if she decided to get the infusion?)  With these type of infusions the sooner they are completed the more effective they are

## 2019-08-11 NOTE — Telephone Encounter (Signed)
This encounter was created in error - please disregard.

## 2019-08-11 NOTE — Telephone Encounter (Signed)
covid test was positive and pt notified yesterday of results. Called pt to see how she was doing. Pt states someone from [facility] called and suggested getting monoclonal antibody infusion. Pt wants to know if dr Richardson Landry recommends that.  Pt states she is a little better than what she was when she talked to dr Richardson Landry. States she still has some sob when getting up and moving around but not worse than what it was at her visit.  No fever.

## 2019-08-11 NOTE — Progress Notes (Signed)
  I connected by phone with Georgeann Oppenheim on 08/11/2019 at 1:44 PM to discuss the potential use of an new treatment for mild to moderate COVID-19 viral infection in non-hospitalized patients.  This patient is a 58 y.o. female that meets the FDA criteria for Emergency Use Authorization of bamlanivimab or casirivimab\imdevimab.  Has a (+) direct SARS-CoV-2 viral test result  Has mild or moderate COVID-19   Is ? 56 years of age and weighs ? 40 kg  Is NOT hospitalized due to COVID-19  Is NOT requiring oxygen therapy or requiring an increase in baseline oxygen flow rate due to COVID-19  Is within 10 days of symptom onset  Has at least one of the high risk factor(s) for progression to severe COVID-19 and/or hospitalization as defined in EUA.  Specific high risk criteria : Diabetes   I have spoken and communicated the following to the patient or parent/caregiver:  1. FDA has authorized the emergency use of bamlanivimab and casirivimab\imdevimab for the treatment of mild to moderate COVID-19 in adults and pediatric patients with positive results of direct SARS-CoV-2 viral testing who are 87 years of age and older weighing at least 40 kg, and who are at high risk for progressing to severe COVID-19 and/or hospitalization.  2. The significant known and potential risks and benefits of bamlanivimab and casirivimab\imdevimab, and the extent to which such potential risks and benefits are unknown.  3. Information on available alternative treatments and the risks and benefits of those alternatives, including clinical trials.  4. Patients treated with bamlanivimab and casirivimab\imdevimab should continue to self-isolate and use infection control measures (e.g., wear mask, isolate, social distance, avoid sharing personal items, clean and disinfect "high touch" surfaces, and frequent handwashing) according to CDC guidelines.   5. The patient or parent/caregiver has the option to accept or refuse  bamlanivimab or casirivimab\imdevimab .  After reviewing this information with the patient, The patient agreed to proceed with receiving the casirivimab\imdevimab infusion and will be provided a copy of the Fact sheet prior to receiving the infusion.Kathrine Haddock 08/11/2019 1:44 PM

## 2019-08-11 NOTE — Telephone Encounter (Signed)
Pt with 5 days of Covid 19 symptoms of cough, headache, no taste or smell.  PCP recommended the infusion. She qualifies for the use of bamlanivimab, a monoclonal antibody infusion for those with mild to moderate Covid symptoms and at a high risk of hospitalization due to Diabetes, Hypertension and BMI>35   Risks and benefits of the infusion was reviewed yesterday.  Will schedule for Wednesday.

## 2019-08-13 ENCOUNTER — Ambulatory Visit (HOSPITAL_COMMUNITY)
Admission: RE | Admit: 2019-08-13 | Discharge: 2019-08-13 | Disposition: A | Discharge status: Home / Self Care | Payer: PRIVATE HEALTH INSURANCE | Source: Ambulatory Visit | Attending: Pulmonary Disease | Admitting: Pulmonary Disease

## 2019-08-13 DIAGNOSIS — U071 COVID-19: Secondary | ICD-10-CM | POA: Insufficient documentation

## 2019-08-13 MED ORDER — SODIUM CHLORIDE 0.9 % IV SOLN
Freq: Once | INTRAVENOUS | Status: AC
  Filled 2019-08-13: qty 10

## 2019-08-13 MED ORDER — ALBUTEROL SULFATE HFA 108 (90 BASE) MCG/ACT IN AERS: 2.0000 | INHALATION_SPRAY | Freq: Once | RESPIRATORY_TRACT | Status: DC | PRN

## 2019-08-13 MED ORDER — DIPHENHYDRAMINE HCL 50 MG/ML IJ SOLN: 50.0000 mg | Freq: Once | INTRAMUSCULAR | Status: DC | PRN

## 2019-08-13 MED ORDER — EPINEPHRINE 0.3 MG/0.3ML IJ SOAJ: 0.3000 mg | Freq: Once | INTRAMUSCULAR | Status: DC | PRN

## 2019-08-13 MED ORDER — FAMOTIDINE IN NACL 20-0.9 MG/50ML-% IV SOLN: 20.0000 mg | Freq: Once | INTRAVENOUS | Status: DC | PRN

## 2019-08-13 MED ORDER — METHYLPREDNISOLONE SODIUM SUCC 125 MG IJ SOLR: 125.0000 mg | Freq: Once | INTRAMUSCULAR | Status: DC | PRN

## 2019-08-13 MED ORDER — SODIUM CHLORIDE 0.9 % IV SOLN
INTRAVENOUS | Status: DC | PRN
  Administered 2019-08-13: 250 mL via INTRAVENOUS

## 2019-08-13 NOTE — Discharge Instructions (Signed)
Prevent the Spread of COVID-19 if You Are Sick If you are sick with COVID-19 or think you might have COVID-19, follow the steps below to help protect other people in your home and community. Stay home except to get medical care.  Stay home. Most people with COVID-19 have mild illness and are able to recover at home without medical care. Do not leave your home, except to get medical care. Do not visit public areas.  Take care of yourself. Get rest and stay hydrated.  Get medical care when needed. Call your doctor before you go to their office for care. But, if you have trouble breathing or other concerning symptoms, call 911 for immediate help.  Avoid public transportation, ride-sharing, or taxis. Separate yourself from other people and pets in your home.  As much as possible, stay in a specific room and away from other people and pets in your home. Also, you should use a separate bathroom, if available. If you need to be around other people or animals in or outside of the home, wear a cloth face covering. ? See COVID-19 and Animals if you have questions about pets: https://www.cdc.gov/coronavirus/2019-ncov/faq.html#COVID19animals Monitor your symptoms.  Common symptoms of COVID-19 include fever and cough. Trouble breathing is a more serious symptom that means you should get medical attention.  Follow care instructions from your healthcare provider and local health department. Your local health authorities will give instructions on checking your symptoms and reporting information. If you develop emergency warning signs for COVID-19 get medical attention immediately.  Emergency warning signs include*:  Trouble breathing  Persistent pain or pressure in the chest  New confusion or not able to be woken  Bluish lips or face *This list is not all inclusive. Please consult your medical provider for any other symptoms that are severe or concerning to you. Call 911 if you have a medical  emergency. If you have a medical emergency and need to call 911, notify the operator that you have or think you might have, COVID-19. If possible, put on a facemask before medical help arrives. Call ahead before visiting your doctor.  Call ahead. Many medical visits for routine care are being postponed or done by phone or telemedicine.  If you have a medical appointment that cannot be postponed, call your doctor's office. This will help the office protect themselves and other patients. If you are sick, wear a cloth covering over your nose and mouth.  You should wear a cloth face covering over your nose and mouth if you must be around other people or animals, including pets (even at home).  You don't need to wear the cloth face covering if you are alone. If you can't put on a cloth face covering (because of trouble breathing for example), cover your coughs and sneezes in some other way. Try to stay at least 6 feet away from other people. This will help protect the people around you. Note: During the COVID-19 pandemic, medical grade facemasks are reserved for healthcare workers and some first responders. You may need to make a cloth face covering using a scarf or bandana. Cover your coughs and sneezes.  Cover your mouth and nose with a tissue when you cough or sneeze.  Throw used tissues in a lined trash can.  Immediately wash your hands with soap and water for at least 20 seconds. If soap and water are not available, clean your hands with an alcohol-based hand sanitizer that contains at least 60% alcohol. Clean your hands often.    Wash your hands often with soap and water for at least 20 seconds. This is especially important after blowing your nose, coughing, or sneezing; going to the bathroom; and before eating or preparing food.  Use hand sanitizer if soap and water are not available. Use an alcohol-based hand sanitizer with at least 60% alcohol, covering all surfaces of your hands and rubbing  them together until they feel dry.  Soap and water are the best option, especially if your hands are visibly dirty.  Avoid touching your eyes, nose, and mouth with unwashed hands. Avoid sharing personal household items.  Do not share dishes, drinking glasses, cups, eating utensils, towels, or bedding with other people in your home.  Wash these items thoroughly after using them with soap and water or put them in the dishwasher. Clean all "high-touch" surfaces everyday.  Clean and disinfect high-touch surfaces in your "sick room" and bathroom. Let someone else clean and disinfect surfaces in common areas, but not your bedroom and bathroom.  If a caregiver or other person needs to clean and disinfect a sick person's bedroom or bathroom, they should do so on an as-needed basis. The caregiver/other person should wear a mask and wait as long as possible after the sick person has used the bathroom. High-touch surfaces include phones, remote controls, counters, tabletops, doorknobs, bathroom fixtures, toilets, keyboards, tablets, and bedside tables.  Clean and disinfect areas that may have blood, stool, or body fluids on them.  Use household cleaners and disinfectants. Clean the area or item with soap and water or another detergent if it is dirty. Then use a household disinfectant. ? Be sure to follow the instructions on the label to ensure safe and effective use of the product. Many products recommend keeping the surface wet for several minutes to ensure germs are killed. Many also recommend precautions such as wearing gloves and making sure you have good ventilation during use of the product. ? Most EPA-registered household disinfectants should be effective. How to discontinue home isolation  People with COVID-19 who have stayed home (home isolated) can stop home isolation under the following conditions: ? If you will not have a test to determine if you are still contagious, you can leave home  after these three things have happened:  You have had no fever for at least 72 hours (that is three full days of no fever without the use of medicine that reduces fevers) AND  other symptoms have improved (for example, when your cough or shortness of breath has improved) AND  at least 10 days have passed since your symptoms first appeared. ? If you will be tested to determine if you are still contagious, you can leave home after these three things have happened:  You no longer have a fever (without the use of medicine that reduces fevers) AND  other symptoms have improved (for example, when your cough or shortness of breath has improved) AND  you received two negative tests in a row, 24 hours apart. Your doctor will follow CDC guidelines. In all cases, follow the guidance of your healthcare provider and local health department. The decision to stop home isolation should be made in consultation with your healthcare provider and state and local health departments. Local decisions depend on local circumstances. cdc.gov/coronavirus 12/22/2018 This information is not intended to replace advice given to you by your health care provider. Make sure you discuss any questions you have with your health care provider. Document Released: 12/03/2018 Document Revised: 01/01/2019 Document Reviewed: 12/03/2018   Elsevier Patient Education  2020 [employer]  

## 2019-08-13 NOTE — Progress Notes (Signed)
  Diagnosis: COVID-19  Physician: Kathyrn Drown  Procedure: [facility] Med: bamlanivimab infusion - Provided patient with bamlanimivab fact sheet for patients, parents and caregivers prior to infusion.  Complications: No immediate complications noted.  Discharge: Discharged home   Susan Becker 08/13/2019

## 2019-09-05 LAB — HEPATIC FUNCTION PANEL
ALT: 17 IU/L (ref 0–32)
AST: 16 IU/L (ref 0–40)
Albumin: 4.4 g/dL (ref 3.8–4.9)
Alkaline Phosphatase: 80 IU/L (ref 39–117)
Bilirubin Total: 0.4 mg/dL (ref 0.0–1.2)
Bilirubin, Direct: 0.1 mg/dL (ref 0.00–0.40)
Total Protein: 7.7 g/dL (ref 6.0–8.5)

## 2019-09-05 LAB — LIPID PANEL
Chol/HDL Ratio: 5.2 ratio — ABNORMAL HIGH (ref 0.0–4.4)
Cholesterol, Total: 208 mg/dL — ABNORMAL HIGH (ref 100–199)
HDL: 40 mg/dL (ref >39)
LDL Chol Calc (NIH): 124 mg/dL — ABNORMAL HIGH (ref 0–99)
Triglycerides: 250 mg/dL — ABNORMAL HIGH (ref 0–149)
VLDL Cholesterol Cal: 44 mg/dL — ABNORMAL HIGH (ref 5–40)

## 2019-09-05 LAB — HEMOGLOBIN A1C
Est. average glucose Bld gHb Est-mCnc: 183 mg/dL
Hgb A1c MFr Bld: 8 % — ABNORMAL HIGH (ref 4.8–5.6)

## 2019-09-11 ENCOUNTER — Encounter: Payer: Self-pay | Admitting: Nurse Practitioner

## 2019-09-11 DIAGNOSIS — N912 Amenorrhea, unspecified: Secondary | ICD-10-CM

## 2019-09-12 NOTE — Telephone Encounter (Signed)
Please reorder labs from 11/20. As usual [facility] will not include other active labs by the same provider. Thanks.

## 2019-09-30 LAB — FOLLICLE STIMULATING HORMONE: FSH: 17.3 m[IU]/mL

## 2019-09-30 LAB — ESTRADIOL: Estradiol: 24 pg/mL

## 2019-10-01 ENCOUNTER — Encounter: Payer: Self-pay | Admitting: Nurse Practitioner

## 2019-10-08 ENCOUNTER — Other Ambulatory Visit: Payer: Self-pay | Admitting: Nurse Practitioner

## 2019-10-08 MED ORDER — NORETHINDRONE 0.35 MG PO TABS: 1.0000 | ORAL_TABLET | Freq: Every day | ORAL | 11 refills | Status: DC

## 2019-10-24 ENCOUNTER — Other Ambulatory Visit: Payer: Self-pay

## 2019-10-24 ENCOUNTER — Ambulatory Visit: Payer: PRIVATE HEALTH INSURANCE | Admitting: Nurse Practitioner

## 2019-10-24 ENCOUNTER — Encounter: Payer: Self-pay | Admitting: Nurse Practitioner

## 2019-10-24 VITALS — BP 148/86 | Temp 97.3°F | Ht 65.0 in | Wt 261.0 lb

## 2019-10-24 DIAGNOSIS — E119 Type 2 diabetes mellitus without complications: Secondary | ICD-10-CM

## 2019-10-24 LAB — POCT GLYCOSYLATED HEMOGLOBIN (HGB A1C): Hemoglobin A1C: 5.1 % (ref 4.0–5.6)

## 2019-10-24 NOTE — Progress Notes (Signed)
   Subjective:    Patient ID: Susan Becker, female    DOB: May 29, 1961, 59 y.o.   MRN: [id number]  Diabetes She presents for her follow-up diabetic visit. She has type 2 diabetes mellitus. She is compliant with treatment all of the time. Home blood sugar record trend: sugar has been up and down. Eye exam is current (may 2020).   Results for orders placed or performed in visit on 10/24/19  POCT glycosylated hemoglobin (Hb A1C)  Result Value Ref Range   Hemoglobin A1C 5.1 4.0 - 5.6 %   HbA1c POC (<> result, manual entry)     HbA1c, POC (prediabetic range)     HbA1c, POC (controlled diabetic range)     Had covid back in December and she still does not have smell or taste.   Pt is having runny nose and cough. States it is allergies.   Review of Systems     Objective:   Physical Exam        Assessment & Plan:

## 2019-10-24 NOTE — Progress Notes (Signed)
   Subjective:    Patient ID: Georgeann Oppenheim, female    DOB: Apr 15, 1961, 59 y.o.   MRN: [id number]  HPI Presents for follow-up diabetic visit for dx of type II diabetes mellitus. Tested positive for COVID in December and states her smell and taste have not returned. Has complaints of sneezing times two days and states she has allergies. Routine visual and dental exams completed. Routine foot exams completed by podiatrist. States she has not been adherent to exercise or diet plan. Stated she has been followed by dietician in the past. Presents log of home glucose levels taken showing drop in morning levels. General range of fasting glucose average 87-179, rarely over 200.    Review of Systems  Constitutional: Negative for activity change, appetite change, fatigue and unexpected weight change.  Respiratory: Negative for cough, chest tightness and wheezing.   Cardiovascular: Negative for chest pain, palpitations and leg swelling.  Gastrointestinal: Negative for abdominal pain, diarrhea, nausea and vomiting.  Genitourinary: Negative for decreased urine volume, dysuria, frequency and urgency.  Musculoskeletal: Negative for arthralgias, gait problem and myalgias.  Skin: Negative for color change, pallor, rash and wound.    General: Denies fatigue, fever and changes in sleep Respiratory: Denies dyspnea, wheezing and sputum production CV: Denies palpitations, chest pain and edema GI: Denies vomiting, constipation, polyphagia, and polydipsia [id number] polyuria, urgency and hematuria     Objective:   Physical Exam Constitutional:      Appearance: Normal appearance. She is obese.  Cardiovascular:     Rate and Rhythm: Normal rate and regular rhythm.     Pulses: Normal pulses.     Heart sounds: Normal heart sounds, S1 normal and S2 normal. No murmur. No gallop.   Pulmonary:     Effort: Pulmonary effort is normal. No respiratory distress.     Breath sounds: Normal breath sounds. No wheezing or  rales.  Skin:    General: Skin is warm and dry.  Neurological:     Mental Status: She is alert.   A1C 09/04/19 8.0      Assessment & Plan:  Diabetes mellitus without complication ([place]) - Plan: POCT glycosylated hemoglobin (Hb A1C)  Morbid obesity ([facility])   Increase Trulicity to 3mg  per injection every week. Provided education related to adverse effects including GI upset. Call office if unable to tolerate. Also, notify us so we can change dosing.  Education provided of importance to adhere to diet and exercise plan.  Offered referral to dietician, deferred at this time  Educate importance to continue with routine vision, dental and podiatry visits Follow up with insurance plan related to [employer] and Dexcom 6  Follow up in 3 months or earlier if needed

## 2019-10-24 NOTE — Patient Instructions (Signed)
[place]

## 2019-10-25 ENCOUNTER — Encounter: Payer: Self-pay | Admitting: Nurse Practitioner

## 2019-11-03 ENCOUNTER — Encounter: Payer: Self-pay | Admitting: Nurse Practitioner

## 2019-11-04 ENCOUNTER — Other Ambulatory Visit: Payer: Self-pay | Admitting: Nurse Practitioner

## 2019-11-04 MED ORDER — TRULICITY 3 MG/0.5ML ~~LOC~~ SOAJ: 3.0000 mg | SUBCUTANEOUS | 2 refills | Status: DC

## 2019-11-07 ENCOUNTER — Other Ambulatory Visit: Payer: Self-pay | Admitting: Nurse Practitioner

## 2019-11-07 ENCOUNTER — Telehealth: Payer: Self-pay | Admitting: Nurse Practitioner

## 2019-11-07 MED ORDER — ESCITALOPRAM OXALATE 20 MG PO TABS: 20.0000 mg | ORAL_TABLET | Freq: Every day | ORAL | 1 refills | Status: DC

## 2019-11-07 NOTE — Telephone Encounter (Signed)
Refill request form [facility] [address] requesting refill on Escitalopram 20 mg tablets. Take one tablet by mouth daily. Pt last seen 10/24/19 for DM. Please advise. Thank you.

## 2019-11-07 NOTE — Telephone Encounter (Signed)
Done

## 2019-12-19 ENCOUNTER — Encounter (INDEPENDENT_AMBULATORY_CARE_PROVIDER_SITE_OTHER): Payer: Self-pay | Admitting: *Deleted

## 2020-01-10 ENCOUNTER — Other Ambulatory Visit: Payer: Self-pay | Admitting: Nurse Practitioner

## 2020-02-04 ENCOUNTER — Other Ambulatory Visit: Payer: Self-pay | Admitting: Family Medicine

## 2020-02-04 DIAGNOSIS — E785 Hyperlipidemia, unspecified: Secondary | ICD-10-CM

## 2020-02-04 DIAGNOSIS — E119 Type 2 diabetes mellitus without complications: Secondary | ICD-10-CM

## 2020-02-04 DIAGNOSIS — I10 Essential (primary) hypertension: Secondary | ICD-10-CM

## 2020-02-04 DIAGNOSIS — Z79899 Other long term (current) drug therapy: Secondary | ICD-10-CM

## 2020-02-05 NOTE — Telephone Encounter (Signed)
Patient will need metabolic 7 lipid liver Q9I urine ACR by September may have 1 refill will need follow-up office visit by September

## 2020-02-06 ENCOUNTER — Encounter: Payer: Self-pay | Admitting: Nurse Practitioner

## 2020-02-07 ENCOUNTER — Other Ambulatory Visit: Payer: Self-pay | Admitting: Nurse Practitioner

## 2020-02-07 MED ORDER — CONTOUR NEXT TEST VI STRP: ORAL_STRIP | 11 refills | Status: DC

## 2020-02-27 ENCOUNTER — Other Ambulatory Visit: Payer: Self-pay | Admitting: Family Medicine

## 2020-03-11 ENCOUNTER — Other Ambulatory Visit (HOSPITAL_COMMUNITY): Payer: Self-pay | Admitting: Family Medicine

## 2020-03-11 DIAGNOSIS — Z1231 Encounter for screening mammogram for malignant neoplasm of breast: Secondary | ICD-10-CM

## 2020-03-19 ENCOUNTER — Ambulatory Visit: Payer: PRIVATE HEALTH INSURANCE | Admitting: Family Medicine

## 2020-03-19 ENCOUNTER — Other Ambulatory Visit: Payer: Self-pay

## 2020-03-19 ENCOUNTER — Encounter: Payer: Self-pay | Admitting: Family Medicine

## 2020-03-19 VITALS — BP 130/70 | HR 81 | Temp 96.5°F | Wt 267.2 lb

## 2020-03-19 DIAGNOSIS — M546 Pain in thoracic spine: Secondary | ICD-10-CM | POA: Diagnosis not present

## 2020-03-19 MED ORDER — TIZANIDINE HCL 2 MG PO CAPS: 2.0000 mg | ORAL_CAPSULE | Freq: Three times a day (TID) | ORAL | 0 refills | Status: DC

## 2020-03-19 MED ORDER — DICLOFENAC SODIUM 75 MG PO TBEC: 75.0000 mg | DELAYED_RELEASE_TABLET | Freq: Two times a day (BID) | ORAL | 0 refills | Status: DC

## 2020-03-19 MED ORDER — HYDROCODONE-ACETAMINOPHEN 5-325 MG PO TABS: 1.0000 | ORAL_TABLET | Freq: Four times a day (QID) | ORAL | 0 refills | Status: DC | PRN

## 2020-03-19 NOTE — Progress Notes (Signed)
Patient ID: Susan Becker, female    DOB: Oct 29, 1960, 59 y.o.   MRN: [id number]   Chief Complaint  Patient presents with  . Back Pain   Subjective:    HPI   Pt having back pain on left side. Going on for about 4-5 days. Pt has tried Ibuprofen, Aleve, ointments. No relief at all. Pt also having some abdominal swelling and cramping in epigastric area. Pain in back is throbbing/aching. Has had some nausea.   Solid throbbing pain, and had some swelling and gas epigastric area.  ibuprofen and aleve and topical.  taking 666m ibuprofen q 4hrs. Or will take aleve 2x. 4-5 days. Left upper back pain. No new lifting or new exercising. No blood in urine, dysruia, or dark urine. No numbness in legs.  Rt arm some tingling due to h/o carpal tunnel.   Medical History [place] a past medical history of HTN (hypertension) (09/2010), Impaired fasting glucose (04/2006), and PCOS (polycystic ovarian syndrome).   Outpatient Encounter Medications as of 03/19/2020  Medication Sig  . albuterol (PROVENTIL HFA;VENTOLIN HFA) 108 (90 Base) MCG/ACT inhaler Inhale 2 puffs into the lungs every 6 (six) hours as needed for wheezing.  . blood glucose meter kit and supplies KIT Dispense based on patient and insurance preference. Use to check blood sugar BID. ICD 10 code E11.9  . Dulaglutide (TRULICITY) 3 MNW/2.9FASOPN Inject 3 mg into the skin once a week.  . escitalopram (LEXAPRO) 20 MG tablet Take 1 tablet (20 mg total) by mouth daily.  . famotidine (PEPCID) 20 MG tablet TAKE 1 TABLET(20 MG) BY MOUTH DAILY AS NEEDED FOR ACID REFLUX  . glipiZIDE (GLUCOTROL XL) 5 MG 24 hr tablet TAKE 1 TABLET(5 MG) BY MOUTH DAILY WITH BREAKFAST  . glucose blood (CONTOUR NEXT TEST) test strip Use as instructed once daily  . losartan (COZAAR) 100 MG tablet TAKE 1 TABLET(100 MG) BY MOUTH DAILY  . metFORMIN (GLUCOPHAGE) 1000 MG tablet TAKE 1 TABLET BY MOUTH TWICE DAILY with a meal  . norethindrone (ORTHO MICRONOR) 0.35 MG  tablet Take 1 tablet (0.35 mg total) by mouth daily.  . pravastatin (PRAVACHOL) 20 MG tablet TAKE 1 TABLET(20 MG) BY MOUTH DAILY  . diclofenac (VOLTAREN) 75 MG EC tablet Take 1 tablet (75 mg total) by mouth 2 (two) times daily.  .Marland KitchenHYDROcodone-acetaminophen (NORCO/VICODIN) 5-325 MG tablet Take 1 tablet by mouth every 6 (six) hours as needed for moderate pain.  . tizanidine (ZANAFLEX) 2 MG capsule Take 1 capsule (2 mg total) by mouth 3 (three) times daily.   No facility-administered encounter medications on file as of 03/19/2020.     Review of Systems  Constitutional: Negative for chills and fever.  HENT: Negative for congestion, rhinorrhea and sore throat.   Respiratory: Negative for cough, shortness of breath and wheezing.   Cardiovascular: Negative for chest pain and leg swelling.  Gastrointestinal: Negative for abdominal pain, diarrhea, nausea and vomiting.  Genitourinary: Negative for difficulty urinating, dysuria, flank pain, frequency, hematuria, pelvic pain and urgency.  Musculoskeletal: Positive for back pain (rt thoracic pain). Negative for arthralgias.  Skin: Negative for rash.  Neurological: Negative for dizziness, weakness and headaches.     Vitals BP (!) 130/70   Pulse 81   Temp (!) 96.5 F (35.8 C)   Wt (!) 267 lb 3.2 oz (121.2 kg)   LMP 06/21/2013   SpO2 98%   BMI 44.46 kg/m   Objective:   Physical Exam Vitals and nursing note reviewed.  Constitutional:      General: She is in acute distress (due to pain).     Appearance: Normal appearance. She is obese. She is not ill-appearing.  HENT:     Head: Normocephalic and atraumatic.     Nose: Nose normal.     Mouth/Throat:     Mouth: Mucous membranes are moist.     Pharynx: Oropharynx is clear.  Eyes:     Extraocular Movements: Extraocular movements intact.     Conjunctiva/sclera: Conjunctivae normal.     Pupils: Pupils are equal, round, and reactive to light.  Cardiovascular:     Rate and Rhythm: Normal rate  and regular rhythm.     Pulses: Normal pulses.     Heart sounds: Normal heart sounds.  Pulmonary:     Effort: Pulmonary effort is normal.     Breath sounds: Normal breath sounds. No wheezing, rhonchi or rales.  Musculoskeletal:        General: Tenderness present. No swelling, deformity or signs of injury. Normal range of motion.     Right lower leg: No edema.     Left lower leg: No edema.     Comments: +ttp over left T7-T9 area, normal inspection to skin, no rash,erythema or warmth. No ttp over thoracic spinous process. Normal rom of arms/shoulder/legs.   Skin:    General: Skin is warm and dry.     Findings: No bruising, erythema, lesion or rash.  Neurological:     General: No focal deficit present.     Mental Status: She is alert and oriented to person, place, and time.     Cranial Nerves: No cranial nerve deficit.     Motor: No weakness.     Gait: Gait normal.  Psychiatric:        Mood and Affect: Mood normal.        Behavior: Behavior normal.        Thought Content: Thought content normal.        Judgment: Judgment normal.      Assessment and Plan   1. Acute left-sided thoracic back pain - HYDROcodone-acetaminophen (NORCO/VICODIN) 5-325 MG tablet; Take 1 tablet by mouth every 6 (six) hours as needed for moderate pain.  Dispense: 15 tablet; Refill: 0 - diclofenac (VOLTAREN) 75 MG EC tablet; Take 1 tablet (75 mg total) by mouth 2 (two) times daily.  Dispense: 60 tablet; Refill: 0 - tizanidine (ZANAFLEX) 2 MG capsule; Take 1 capsule (2 mg total) by mouth 3 (three) times daily.  Dispense: 15 capsule; Refill: 0   Take the diclofenac with food daily.  Use zanaflex as needed for spasm. Use hydrocodone of severe pain prn.  Use heat/ice and topicals prn.  F/u 2-3 wks for recheck.

## 2020-03-23 ENCOUNTER — Other Ambulatory Visit: Payer: Self-pay | Admitting: Nurse Practitioner

## 2020-03-24 ENCOUNTER — Encounter: Payer: Self-pay | Admitting: Nurse Practitioner

## 2020-03-26 ENCOUNTER — Other Ambulatory Visit: Payer: Self-pay | Admitting: Nurse Practitioner

## 2020-03-26 MED ORDER — TRULICITY 1.5 MG/0.5ML ~~LOC~~ SOAJ: 1.5000 mg | SUBCUTANEOUS | 5 refills | Status: DC

## 2020-04-05 ENCOUNTER — Ambulatory Visit: Payer: PRIVATE HEALTH INSURANCE | Admitting: Family Medicine

## 2020-04-13 ENCOUNTER — Other Ambulatory Visit: Payer: Self-pay | Admitting: Family Medicine

## 2020-04-15 ENCOUNTER — Other Ambulatory Visit: Payer: Self-pay | Admitting: Family Medicine

## 2020-04-15 DIAGNOSIS — M546 Pain in thoracic spine: Secondary | ICD-10-CM

## 2020-05-06 ENCOUNTER — Other Ambulatory Visit: Payer: Self-pay | Admitting: Family Medicine

## 2020-05-06 ENCOUNTER — Other Ambulatory Visit: Payer: Self-pay | Admitting: Nurse Practitioner

## 2020-05-07 LAB — HEPATIC FUNCTION PANEL
ALT: 23 IU/L (ref 0–32)
AST: 23 IU/L (ref 0–40)
Albumin: 4.4 g/dL (ref 3.8–4.9)
Alkaline Phosphatase: 74 IU/L (ref 44–121)
Bilirubin Total: 0.3 mg/dL (ref 0.0–1.2)
Bilirubin, Direct: 0.1 mg/dL (ref 0.00–0.40)
Total Protein: 7.2 g/dL (ref 6.0–8.5)

## 2020-05-07 LAB — BASIC METABOLIC PANEL
BUN/Creatinine Ratio: 18 (ref 9–23)
BUN: 10 mg/dL (ref 6–24)
CO2: 25 mmol/L (ref 20–29)
Calcium: 9.2 mg/dL (ref 8.7–10.2)
Chloride: 101 mmol/L (ref 96–106)
Creatinine, Ser: 0.57 mg/dL (ref 0.57–1.00)
GFR calc Af Amer: 120 mL/min/{1.73_m2} (ref >59)
GFR calc non Af Amer: 104 mL/min/{1.73_m2} (ref >59)
Glucose: 204 mg/dL — ABNORMAL HIGH (ref 65–99)
Potassium: 4.5 mmol/L (ref 3.5–5.2)
Sodium: 139 mmol/L (ref 134–144)

## 2020-05-07 LAB — MICROALBUMIN / CREATININE URINE RATIO
Creatinine, Urine: 123.5 mg/dL
Microalb/Creat Ratio: 20 mg/g creat (ref 0–29)
Microalbumin, Urine: 24.2 ug/mL

## 2020-05-07 LAB — LIPID PANEL
Chol/HDL Ratio: 4.9 ratio — ABNORMAL HIGH (ref 0.0–4.4)
Cholesterol, Total: 195 mg/dL (ref 100–199)
HDL: 40 mg/dL (ref >39)
LDL Chol Calc (NIH): 110 mg/dL — ABNORMAL HIGH (ref 0–99)
Triglycerides: 258 mg/dL — ABNORMAL HIGH (ref 0–149)
VLDL Cholesterol Cal: 45 mg/dL — ABNORMAL HIGH (ref 5–40)

## 2020-05-07 LAB — HEMOGLOBIN A1C
Est. average glucose Bld gHb Est-mCnc: 217 mg/dL
Hgb A1c MFr Bld: 9.2 % — ABNORMAL HIGH (ref 4.8–5.6)

## 2020-05-14 ENCOUNTER — Other Ambulatory Visit: Payer: Self-pay

## 2020-05-14 ENCOUNTER — Encounter: Payer: Self-pay | Admitting: Nurse Practitioner

## 2020-05-14 ENCOUNTER — Ambulatory Visit: Payer: PRIVATE HEALTH INSURANCE | Admitting: Nurse Practitioner

## 2020-05-14 ENCOUNTER — Other Ambulatory Visit: Payer: Self-pay | Admitting: Nurse Practitioner

## 2020-05-14 VITALS — BP 128/83 | Temp 97.1°F | Ht 65.0 in | Wt 270.0 lb

## 2020-05-14 DIAGNOSIS — B3731 Acute candidiasis of vulva and vagina: Secondary | ICD-10-CM | POA: Insufficient documentation

## 2020-05-14 DIAGNOSIS — B373 Candidiasis of vulva and vagina: Secondary | ICD-10-CM

## 2020-05-14 DIAGNOSIS — E119 Type 2 diabetes mellitus without complications: Secondary | ICD-10-CM

## 2020-05-14 DIAGNOSIS — M62838 Other muscle spasm: Secondary | ICD-10-CM | POA: Diagnosis not present

## 2020-05-14 DIAGNOSIS — Z1211 Encounter for screening for malignant neoplasm of colon: Secondary | ICD-10-CM

## 2020-05-14 MED ORDER — FLUCONAZOLE 150 MG PO TABS: ORAL_TABLET | ORAL | 0 refills | Status: DC

## 2020-05-14 MED ORDER — TRESIBA FLEXTOUCH 100 UNIT/ML ~~LOC~~ SOPN: 10.0000 [IU] | PEN_INJECTOR | Freq: Every day | SUBCUTANEOUS | 2 refills | Status: DC

## 2020-05-14 MED ORDER — PEN NEEDLES 31G X 5 MM MISC: 0 refills | Status: DC

## 2020-05-14 MED ORDER — PHENTERMINE HCL 37.5 MG PO TABS: 37.5000 mg | ORAL_TABLET | Freq: Every day | ORAL | 2 refills | Status: DC

## 2020-05-14 MED ORDER — FREESTYLE LIBRE 14 DAY SENSOR MISC: 11 refills | Status: DC

## 2020-05-14 NOTE — Patient Instructions (Signed)
Monitor blood sugars via [employer] (download app to your phone) Healthy diet (low carb, vegetables, proteins) Exercise (walking 3-5x/wk) Appetite suppressant as prescribed Insulin as ordered Stop Glipizide Diflucan as prescribed

## 2020-05-14 NOTE — Progress Notes (Addendum)
Subjective:    Subjective   Patient ID: Susan Becker, female    DOB: 11-Jul-1961, 59 y.o.   MRN: [id number]  Diabetes Patient presents for her follow-up diabetic visit. She has type 2 diabetes mellitus. Risk factors for coronary artery disease include diabetes mellitus and hypertension. Current diabetic treatment includes oral agent (dual therapy) and Trulicity. She reports she is compliant with her medicine all of the time. Her home fasting glucose range is usually in the lower 200's, however her Trulicity was decreased last month due to intolerable GI side effects. Her weight is stable. She endorses a sedentary work environment and does not exercise at this time. She does desire to begin a walking routine now that the temperature is decreasing. She reports her diet consists mostly of fried and processed foods, which she desires to change at this time. She has tried weight watchers and low carb diets in the past but becomes very unsatisfied and bored with them. Her husband is present for this appointment and supportive. She completed her annual eye exam last month. She requests medication at this time for a vaginal yeast infection and is also having trouble with lateral neck pain that is causing headaches for 2 weeks.    Review of Systems  Constitutional: Negative for chills, diaphoresis, fever and weight loss.  Eyes: Negative for blurred vision.  Respiratory: Negative for cough.   Cardiovascular: Negative for chest pain, palpitations and leg swelling.  Gastrointestinal: Negative for abdominal pain, blood in stool, constipation, diarrhea, nausea and vomiting.  Genitourinary: Negative for frequency and urgency.       Vaginal Discharge-yeast  Musculoskeletal: Positive for neck pain.       Bilateral lateral neck pain. Pain in the right upper back. Has had problems with muscle spasms in the past.   Neurological: Positive for headaches. Negative for dizziness, tingling, tremors and weakness.   Psychiatric/Behavioral: Negative for depression.       Objective:   Objective  Vitals:   05/14/20 0859  BP: 128/83  Temp: (!) 97.1 F (36.2 C)   Last Weight  Most recent update: 05/14/2020  9:14 AM   Weight  122.5 kg (270 lb)             Physical Exam Constitutional:      General: She is not in acute distress.    Appearance: Normal appearance. She is obese.  HENT:     Head: Normocephalic.  Neck:     Comments: Tight tender muscles along the lateral neck area. Distinct tight tender muscles along the right rhomboid.  Cardiovascular:     Rate and Rhythm: Normal rate and regular rhythm.     Heart sounds: Normal heart sounds.  Pulmonary:     Effort: Pulmonary effort is normal.     Breath sounds: No wheezing or rhonchi.  Musculoskeletal:        General: Normal range of motion.  Skin:    General: Skin is warm and dry.     Capillary Refill: Capillary refill takes more than 3 seconds.  Neurological:     Mental Status: She is alert and oriented to person, place, and time.     Motor: No weakness.  Psychiatric:        Behavior: Behavior normal.        Thought Content: Thought content normal.    Diabetic Foot Exam - Simple   Simple Foot Form Diabetic Foot exam was performed with the following findings: Yes 05/14/2020 11:52 AM  Visual  Inspection No deformities, no ulcerations, no other skin breakdown bilaterally: Yes Sensation Testing Intact to touch and monofilament testing bilaterally: Yes Pulse Check Posterior Tibialis and Dorsalis pulse intact bilaterally: Yes Comments     Recent Results (from the past 2160 hour(s))  Lipid panel     Status: Abnormal   Collection Time: 05/06/20  8:49 AM  Result Value Ref Range   Cholesterol, Total 195 100 - 199 mg/dL   Triglycerides 258 (H) 0 - 149 mg/dL   HDL 40 >39 mg/dL   VLDL Cholesterol Cal 45 (H) 5 - 40 mg/dL   LDL Chol Calc (NIH) 110 (H) 0 - 99 mg/dL   Chol/HDL Ratio 4.9 (H) 0.0 - 4.4 ratio    Comment:                                    T. Chol/HDL Ratio                                             Men  Women                               1/2 Avg.Risk  3.4    3.3                                   Avg.Risk  5.0    4.4                                2X Avg.Risk  9.6    7.1                                3X Avg.Risk 23.4   11.0   Hepatic function panel     Status: None   Collection Time: 05/06/20  8:49 AM  Result Value Ref Range   Total Protein 7.2 6.0 - 8.5 g/dL   Albumin 4.4 3.8 - 4.9 g/dL   Bilirubin Total 0.3 0.0 - 1.2 mg/dL   Bilirubin, Direct 0.10 0.00 - 0.40 mg/dL   Alkaline Phosphatase 74 44 - 121 IU/L    Comment:               **Please note reference interval change**   AST 23 0 - 40 IU/L   ALT 23 0 - 32 IU/L  Basic metabolic panel     Status: Abnormal   Collection Time: 05/06/20  8:49 AM  Result Value Ref Range   Glucose 204 (H) 65 - 99 mg/dL   BUN 10 6 - 24 mg/dL   Creatinine, Ser 0.57 0.57 - 1.00 mg/dL   GFR calc non Af Amer 104 >59 mL/min/1.73   GFR calc Af Amer 120 >59 mL/min/1.73    Comment: **[facility] currently reports eGFR in compliance with the current**   recommendations of the [employer]. [facility] will   update reporting as new guidelines are published from the NKF-ASN   Task force.    BUN/Creatinine Ratio 18 9 - 23   Sodium 139 134 - 144 mmol/L   Potassium  4.5 3.5 - 5.2 mmol/L   Chloride 101 96 - 106 mmol/L   CO2 25 20 - 29 mmol/L   Calcium 9.2 8.7 - 10.2 mg/dL  Hemoglobin A1c     Status: Abnormal   Collection Time: 05/06/20  8:49 AM  Result Value Ref Range   Hgb A1c MFr Bld 9.2 (H) 4.8 - 5.6 %    Comment:          Prediabetes: 5.7 - 6.4          Diabetes: >6.4          Glycemic control for adults with diabetes: <7.0    Est. average glucose Bld gHb Est-mCnc 217 mg/dL  Microalbumin / creatinine urine ratio     Status: None   Collection Time: 05/06/20  8:49 AM  Result Value Ref Range   Creatinine, Urine 123.5 Not Estab. mg/dL   Microalbumin, Urine  24.2 Not Estab. ug/mL   Microalb/Creat Ratio 20 0 - 29 mg/g creat    Comment:                        Normal:                0 -  29                        Moderately increased: 30 - 300                        Severely increased:       >300    Reviewed labs with patient.     Assessment & Plan:   Problem List Items Addressed This Visit      Endocrine   Diabetes mellitus without complication ([place]) - Primary   Relevant Medications   insulin degludec (TRESIBA FLEXTOUCH) 100 UNIT/ML FlexTouch Pen     Genitourinary   Candidiasis of vagina   Relevant Medications   fluconazole (DIFLUCAN) 150 MG tablet     Other   Morbid obesity (H[facility])   Relevant Medications   phentermine (ADIPEX-P) 37.5 MG tablet   insulin degludec (TRESIBA FLEXTOUCH) 100 UNIT/ML FlexTouch Pen       Meds ordered this encounter  Medications  . phentermine (ADIPEX-P) 37.5 MG tablet    Sig: Take 1 tablet (37.5 mg total) by mouth daily before breakfast.    Dispense:  30 tablet    Refill:  2    Order Specific Question:   Supervising Provider    Answer:   Sallee Lange A [[id number]]  . insulin degludec (TRESIBA FLEXTOUCH) 100 UNIT/ML FlexTouch Pen    Sig: Inject 10 Units into the skin daily.    Dispense:  3 mL    Refill:  2    Order Specific Question:   Supervising Provider    Answer:   Sallee Lange A [[id number]]  . Insulin Pen Needle (PEN NEEDLES) 31G X 5 MM MISC    Sig: Use daily with insulin pen as directed    Dispense:  100 each    Refill:  0    Order Specific Question:   Supervising Provider    Answer:   Sallee Lange A [[id number]]  . fluconazole (DIFLUCAN) 150 MG tablet    Sig: One po qd prn yeast infection; may repeat in 3-4 days if needed    Dispense:  2 tablet    Refill:  0    Order Specific Question:  Supervising Provider    Answer:   Sallee Lange A [[id number]]   Lifestyle modifications as discussed (walking 3-5x/wk, low carb low sugar diet) Goal to reduce weight 5-10lbs by next visit and FBS less than 150.    Provided Free Style Libre for monitoring blood sugars with meals. Sensor applied during visit per patient request.  Stop Glipizide Begin Tresiba for tighter glucose control; discussed side effects and use Begin Phentermine for appetite suppressant; discussed side effects and use. Has taken this in the past without difficulty.  Massage and proper body ergonomics while working for neck discomfort Education provided on candidiasis; fluconazole as prescribed.  Refer for screening colonoscopy Influenza Vaccine scheduled by employer for next week  Mammogram scheduled for next month. Return in about 3 months (around 08/13/2020) for A1c and weight check. Call back sooner if FBS remains above 150 or if any problems.     Marland Kitchen

## 2020-05-14 NOTE — Progress Notes (Signed)
   Subjective:    Patient ID: Susan Becker, female    DOB: [birth year], 59 y.o.   MRN: [id number]  Diabetes She presents for her follow-up diabetic visit. She has type 2 diabetes mellitus. Risk factors for coronary artery disease include diabetes mellitus. Current diabetic treatment includes oral agent (dual therapy) (trulicity). She is compliant with treatment all of the time. Her weight is stable.    Patient would like to discuss recent labs and is also having trouble with neck pain that is causing headaches for 2 weeks.  Review of Systems     Objective:   Physical Exam        Assessment & Plan:

## 2020-05-16 ENCOUNTER — Encounter: Payer: Self-pay | Admitting: Nurse Practitioner

## 2020-05-17 ENCOUNTER — Other Ambulatory Visit: Payer: Self-pay | Admitting: Nurse Practitioner

## 2020-05-17 ENCOUNTER — Encounter: Payer: Self-pay | Admitting: Nurse Practitioner

## 2020-05-17 MED ORDER — LANTUS SOLOSTAR 100 UNIT/ML ~~LOC~~ SOPN: 10.0000 [IU] | PEN_INJECTOR | Freq: Every day | SUBCUTANEOUS | 0 refills | Status: DC

## 2020-05-19 ENCOUNTER — Telehealth: Payer: Self-pay | Admitting: Family Medicine

## 2020-05-19 ENCOUNTER — Other Ambulatory Visit: Payer: Self-pay | Admitting: Family Medicine

## 2020-05-19 NOTE — Telephone Encounter (Signed)
PA attempted for Phentermine 37.5 mg tablets. PA approved from 05/19/20-11/16/20. Pt notified and verbalized understanding.

## 2020-05-21 ENCOUNTER — Encounter: Payer: Self-pay | Admitting: Family Medicine

## 2020-06-04 ENCOUNTER — Other Ambulatory Visit: Payer: Self-pay

## 2020-06-04 ENCOUNTER — Ambulatory Visit (HOSPITAL_COMMUNITY)
Admission: RE | Admit: 2020-06-04 | Discharge: 2020-06-04 | Disposition: A | Discharge status: Home / Self Care | Payer: PRIVATE HEALTH INSURANCE | Source: Ambulatory Visit | Attending: Family Medicine | Admitting: Family Medicine

## 2020-06-04 DIAGNOSIS — Z1231 Encounter for screening mammogram for malignant neoplasm of breast: Secondary | ICD-10-CM | POA: Insufficient documentation

## 2020-06-29 ENCOUNTER — Other Ambulatory Visit (INDEPENDENT_AMBULATORY_CARE_PROVIDER_SITE_OTHER): Payer: Self-pay

## 2020-06-29 DIAGNOSIS — Z86010 Personal history of colonic polyps: Secondary | ICD-10-CM

## 2020-06-29 DIAGNOSIS — Z800 Family history of malignant neoplasm of digestive organs: Secondary | ICD-10-CM

## 2020-07-05 ENCOUNTER — Other Ambulatory Visit: Payer: Self-pay | Admitting: *Deleted

## 2020-07-05 ENCOUNTER — Telehealth: Payer: Self-pay

## 2020-07-05 DIAGNOSIS — E119 Type 2 diabetes mellitus without complications: Secondary | ICD-10-CM

## 2020-07-05 DIAGNOSIS — E785 Hyperlipidemia, unspecified: Secondary | ICD-10-CM

## 2020-07-05 NOTE — Telephone Encounter (Signed)
Last visit 05/06/2020: Lipid, Liver, Met 7, HgbA1c, Urine microalbumin

## 2020-07-05 NOTE — Telephone Encounter (Signed)
Patient has physical on 11/19 and needing labs

## 2020-07-05 NOTE — Telephone Encounter (Signed)
A1c, lipid Uncontrolled diabetes, hyperlipidemia, met 7 

## 2020-07-06 ENCOUNTER — Encounter (INDEPENDENT_AMBULATORY_CARE_PROVIDER_SITE_OTHER): Payer: Self-pay

## 2020-07-06 ENCOUNTER — Encounter: Payer: Self-pay | Admitting: Nurse Practitioner

## 2020-07-06 ENCOUNTER — Telehealth (INDEPENDENT_AMBULATORY_CARE_PROVIDER_SITE_OTHER): Payer: Self-pay

## 2020-07-06 NOTE — Telephone Encounter (Signed)
Patient has coupon card 

## 2020-07-06 NOTE — Telephone Encounter (Signed)
Please hold on labs. It has not been 3 months since her last A1C. No labs for this visit. Thanks.

## 2020-07-07 LAB — LIPID PANEL
Chol/HDL Ratio: 4.7 ratio — ABNORMAL HIGH (ref 0.0–4.4)
Cholesterol, Total: 199 mg/dL (ref 100–199)
HDL: 42 mg/dL (ref >39)
LDL Chol Calc (NIH): 110 mg/dL — ABNORMAL HIGH (ref 0–99)
Triglycerides: 270 mg/dL — ABNORMAL HIGH (ref 0–149)
VLDL Cholesterol Cal: 47 mg/dL — ABNORMAL HIGH (ref 5–40)

## 2020-07-07 LAB — HEMOGLOBIN A1C
Est. average glucose Bld gHb Est-mCnc: 194 mg/dL
Hgb A1c MFr Bld: 8.4 % — ABNORMAL HIGH (ref 4.8–5.6)

## 2020-07-09 ENCOUNTER — Ambulatory Visit (INDEPENDENT_AMBULATORY_CARE_PROVIDER_SITE_OTHER): Payer: PRIVATE HEALTH INSURANCE | Admitting: Nurse Practitioner

## 2020-07-09 ENCOUNTER — Encounter: Payer: Self-pay | Admitting: Nurse Practitioner

## 2020-07-09 ENCOUNTER — Encounter: Payer: PRIVATE HEALTH INSURANCE | Admitting: Nurse Practitioner

## 2020-07-09 ENCOUNTER — Other Ambulatory Visit: Payer: Self-pay

## 2020-07-09 VITALS — BP 138/90 | HR 99 | Temp 94.3°F | Ht 64.5 in | Wt 256.0 lb

## 2020-07-09 DIAGNOSIS — E785 Hyperlipidemia, unspecified: Secondary | ICD-10-CM

## 2020-07-09 DIAGNOSIS — Z01419 Encounter for gynecological examination (general) (routine) without abnormal findings: Secondary | ICD-10-CM

## 2020-07-09 DIAGNOSIS — K219 Gastro-esophageal reflux disease without esophagitis: Secondary | ICD-10-CM | POA: Diagnosis not present

## 2020-07-09 DIAGNOSIS — E119 Type 2 diabetes mellitus without complications: Secondary | ICD-10-CM | POA: Diagnosis not present

## 2020-07-09 MED ORDER — ROSUVASTATIN CALCIUM 5 MG PO TABS: 5.0000 mg | ORAL_TABLET | Freq: Every day | ORAL | 0 refills | Status: DC

## 2020-07-09 MED ORDER — PANTOPRAZOLE SODIUM 40 MG PO TBEC: 40.0000 mg | DELAYED_RELEASE_TABLET | Freq: Every day | ORAL | 2 refills | Status: DC

## 2020-07-09 NOTE — Progress Notes (Signed)
   Subjective:    Patient ID: Susan Becker, female    DOB: [birth year], 59 y.o.   MRN: [id number]  HPI    Review of Systems     Objective:   Physical Exam        Assessment & Plan:

## 2020-07-09 NOTE — Patient Instructions (Addendum)
Increase Lantus by 2 units every 3 days until you reach a fasting blood sugar of  Less than 140. Increase up to 20 units. If your sugars do not reach goal at 20 units, please contact the office.   Stop taking the pravastatin, and start the Crestor 5 mg by mouth daily. Repeat your lab work in 8-10 weeks.  Follow up in 3 months.

## 2020-07-09 NOTE — Progress Notes (Addendum)
Subjective:    Patient ID: Susan Becker, female    DOB: 07/26/1961, 59 y.o.   MRN: [id number]  HPI The patient comes in today for a wellness visit.  A review of their health history was completed. A review of medications was also completed.  Eating habits: eats healthy, has lost some weight recently. She is attending a diabetes class through work.     Falls/  MVA accidents in past few months: none  Regular exercise: tries to walk when she can.  Has regular dental and eye exams.   Preventative health issues were discussed. She has a Colonoscopy scheduled in December. Same sexual partner. Denies using tobacco, alcohol, or illicit drugs.  No additional concerns at this time.  Review of Systems General: Denies fever, HA,  CV: Denies chest pain, palpitations, tachycardia Pulm: Denies cough, SOB, wheezing GI/GU: Denies upset stomach, nausea, vomiting, or constipation. Occasional diarrhea due to metformin, she says it is tolerable. She reports mild reflux, taking Pepcid for this. Denies urinary urgency, frequency, dysuria, pelvic or flank pain. Denies any abnormal uterine or vaginal bleeding. Mild mid epigastric area pain only with palpation.    GAD 7 : Generalized Anxiety Score 07/09/2020 10/29/2018  Nervous, Anxious, on Edge 0 0  Control/stop worrying 2 0  Worry too much - different things 1 1  Trouble relaxing 1 0  Restless 0 0  Easily annoyed or irritable 0 0  Afraid - awful might happen 0 0  Total GAD 7 Score 4 1  Anxiety Difficulty Not difficult at all Not difficult at all    [facility] SCORE ONLY 07/09/2020 07/11/2019 09/18/2018  PHQ-9 Total Score 0 0 0    Objective:   Physical Exam General: Patient is alert, well groomed, making good eye contact, with coherent thought and judgement. CV: Hearts sounds are normal without murmur or bruit. Regular rate and rhythm. Skin is warm and dry Pulm: Breath sounds are equal and clear bilaterally without wheezing or rhonchi.  GI/GU:  Abdomen is flat and soft without mass. Mild discomfort in epigastric region on palpation. No rebound or guarding. No obvious masses.  External GU is pink without erythema, rash or lesions. Internal vagina is pink without rash, lesions, discharge or erythema. No CMT. Bimanual exam limited due to body habitus. Adnexa is without mass or tenderness.  Chest: Breast are symmetrical without erythema, mass, swelling, tenderness, nipple change or discharge. No axillary, pectoral or supraclavicular adenopathy noted Neck: Thyroid is without mass, thyromegaly, or tenderness. No cervical adenopathy noted.  Vitals:   07/09/20 0826  BP: 138/90  Pulse: 99  Temp: (!) 94.3 F (34.6 C)  SpO2: 97%   Results for orders placed or performed in visit on 07/05/20  Hemoglobin A1c  Result Value Ref Range   Hgb A1c MFr Bld 8.4 (H) 4.8 - 5.6 %   Est. average glucose Bld gHb Est-mCnc 194 mg/dL  Lipid panel  Result Value Ref Range   Cholesterol, Total 199 100 - 199 mg/dL   Triglycerides 270 (H) 0 - 149 mg/dL   HDL 42 >39 mg/dL   VLDL Cholesterol Cal 47 (H) 5 - 40 mg/dL   LDL Chol Calc (NIH) 110 (H) 0 - 99 mg/dL   Chol/HDL Ratio 4.7 (H) 0.0 - 4.4 ratio       Assessment & Plan:   Problem List Items Addressed This Visit      Digestive   Gastroesophageal reflux disease without esophagitis   Relevant Medications   pantoprazole (PROTONIX) 40  MG tablet     Endocrine   Diabetes mellitus without complication (HCC)   Relevant Medications   rosuvastatin (CRESTOR) 5 MG tablet   Other Relevant Orders   Lipid Profile   Hepatic function panel     Other   Hyperlipidemia LDL goal <100   Relevant Medications   rosuvastatin (CRESTOR) 5 MG tablet   Other Relevant Orders   Lipid Profile   Hepatic function panel    Other Visit Diagnoses    Well woman exam    -  Primary     Meds ordered this encounter  Medications  . rosuvastatin (CRESTOR) 5 MG tablet    Sig: Take 1 tablet (5 mg total) by mouth daily. For  cholesterol    Dispense:  90 tablet    Refill:  0  . pantoprazole (PROTONIX) 40 MG tablet    Sig: Take 1 tablet (40 mg total) by mouth daily. For acid reflux    Dispense:  30 tablet    Refill:  2    Order Specific Question:   Supervising Provider    Answer:   Sallee Lange A [[id number]]    Note sent to patient regarding change to Protonix and lifestyle changes for GERD.  Discussed recent A1c, and would like to Increase Lantus to obtain a goal A1c of 7%. Increase Lantus by 2 units every 3 days until you reach a fasting blood sugar of  Less than 140. Increase up to 20 units. If your sugars do not reach goal at 20 units, please contact the office.  For Hyperlipidemia, Stop taking the pravastatin, and start the Crestor 5 mg by mouth daily. Repeat your lab work in 8-10 weeks.   Encouraged to continue with weight loss efforts and healthy eating habits.  Follow up in 3 months. Call back in 2 weeks if GERD has not resolved.

## 2020-07-12 ENCOUNTER — Telehealth (INDEPENDENT_AMBULATORY_CARE_PROVIDER_SITE_OTHER): Payer: Self-pay | Admitting: *Deleted

## 2020-07-12 NOTE — Telephone Encounter (Signed)
Referring MD/PCP: luking   Procedure: tcs w propofol  Reason/Indication:  Hx polyps, fam hx colon ca  Has patient had this procedure before?  Yes, 2016  If so, when, by whom and where?    Is there a family history of colon cancer?  Yes, grandmother, sister  Who?  What age when diagnosed?    Is patient diabetic?   Yes,      Does patient have prosthetic heart valve or mechanical valve?  no  Do you have a pacemaker/defibrillator?  no  Has patient ever had endocarditis/atrial fibrillation? no  Does patient use oxygen? no  Has patient had joint replacement within last 12 months?  no  Is patient constipated or do they take laxatives? no  Does patient have a history of alcohol/drug use?  no  Is patient on blood thinner such as Coumadin, Plavix and/or Aspirin? no  Medications: losartan 100 mg daily, metformin 1000 mg bid, vit c daily, vit d daily, mvi daily, lantus 10 units daily, adipex 37.5 daily, insulin pen as directed, escitalopram 20 mg daily, pravastatin 20 mg daily, famotidine 20 mg daily, trulicty 694 once a week  Allergies: nkda  Medication Adjustment per Dr Rehman/Dr Jenetta Downer orl DM hold evening before & morning of, 1/2 insluin evening before and hold morning of  Procedure date & time: 08/18/20

## 2020-07-15 ENCOUNTER — Other Ambulatory Visit: Payer: Self-pay | Admitting: Family Medicine

## 2020-07-20 MED ORDER — PLENVU 140 G PO SOLR: 354.0000 mL | Freq: Once | ORAL | 0 refills | Status: AC

## 2020-07-31 ENCOUNTER — Encounter: Payer: Self-pay | Admitting: Nurse Practitioner

## 2020-08-06 ENCOUNTER — Other Ambulatory Visit: Payer: Self-pay | Admitting: Nurse Practitioner

## 2020-08-06 ENCOUNTER — Ambulatory Visit: Payer: PRIVATE HEALTH INSURANCE | Admitting: Nurse Practitioner

## 2020-08-06 MED ORDER — TRULICITY 1.5 MG/0.5ML ~~LOC~~ SOAJ: 1.5000 mg | SUBCUTANEOUS | 5 refills | Status: DC

## 2020-08-09 ENCOUNTER — Other Ambulatory Visit: Payer: Self-pay | Admitting: Nurse Practitioner

## 2020-08-09 MED ORDER — TRULICITY 0.75 MG/0.5ML ~~LOC~~ SOAJ: SUBCUTANEOUS | 2 refills | Status: DC

## 2020-08-11 NOTE — Patient Instructions (Signed)
Your procedure is scheduled on: 08/18/2020  Report to [facility] entrance at   8:00  AM.  Call this number if you have problems the morning of surgery: [phone]   Remember:              Follow Directions on the letter you received from Your Physician's office regarding the Bowel Prep              No Smoking the day of Procedure :   Take these medicines the morning of surgery with A SIP OF WATER:Protonix and Lexapro              No diabetic medication the morning of procedure    Do not wear jewelry, make-up or nail polish.    Do not bring valuables to the hospital.  Contacts, dentures or bridgework may not be worn into surgery.  .   Patients discharged the day of surgery will not be allowed to drive home.     Colonoscopy, Adult, Care After This sheet gives you information about how to care for yourself after your procedure. Your health care provider may also give you more specific instructions. If you have problems or questions, contact your health care provider. What can I expect after the procedure? After the procedure, it is common to have:  A small amount of blood in your stool for 24 hours after the procedure.  Some gas.  Mild abdominal cramping or bloating.  Follow these instructions at home: General instructions   For the first 24 hours after the procedure: ? Do not drive or use machinery. ? Do not sign important documents. ? Do not drink alcohol. ? Do your regular daily activities at a slower pace than normal. ? Eat soft, easy-to-digest foods. ? Rest often.  Take over-the-counter or prescription medicines only as told by your health care provider.  It is up to you to get the results of your procedure. Ask your health care provider, or the department performing the procedure, when your results will be ready. Relieving cramping and bloating  Try walking around when you have cramps or feel bloated.  Apply heat to your abdomen as told by your health  care provider. Use a heat source that your health care provider recommends, such as a moist heat pack or a heating pad. ? Place a towel between your skin and the heat source. ? Leave the heat on for 20-30 minutes. ? Remove the heat if your skin turns bright red. This is especially important if you are unable to feel pain, heat, or cold. You may have a greater risk of getting burned. Eating and drinking  Drink enough fluid to keep your urine clear or pale yellow.  Resume your normal diet as instructed by your health care provider. Avoid heavy or fried foods that are hard to digest.  Avoid drinking alcohol for as long as instructed by your health care provider. Contact a health care provider if:  You have blood in your stool 2-3 days after the procedure. Get help right away if:  You have more than a small spotting of blood in your stool.  You pass large blood clots in your stool.  Your abdomen is swollen.  You have nausea or vomiting.  You have a fever.  You have increasing abdominal pain that is not relieved with medicine. This information is not intended to replace advice given to you by your health care provider. Make sure you discuss any questions you  have with your health care provider. Document Released: 03/21/2004 Document Revised: 05/01/2016 Document Reviewed: 10/19/2015 Elsevier Interactive Patient Education  [employer].

## 2020-08-14 ENCOUNTER — Other Ambulatory Visit: Payer: Self-pay | Admitting: Family Medicine

## 2020-08-17 ENCOUNTER — Encounter (HOSPITAL_COMMUNITY)
Admission: RE | Admit: 2020-08-17 | Discharge: 2020-08-17 | Disposition: A | Discharge status: Home / Self Care | Payer: PRIVATE HEALTH INSURANCE | Source: Ambulatory Visit | Attending: Internal Medicine | Admitting: Internal Medicine

## 2020-08-17 ENCOUNTER — Encounter (HOSPITAL_COMMUNITY): Payer: Self-pay

## 2020-08-17 ENCOUNTER — Other Ambulatory Visit: Payer: Self-pay

## 2020-08-17 ENCOUNTER — Other Ambulatory Visit (HOSPITAL_COMMUNITY)
Admission: RE | Admit: 2020-08-17 | Discharge: 2020-08-17 | Disposition: A | Discharge status: Home / Self Care | Payer: PRIVATE HEALTH INSURANCE | Source: Ambulatory Visit | Attending: Internal Medicine | Admitting: Internal Medicine

## 2020-08-17 DIAGNOSIS — I10 Essential (primary) hypertension: Secondary | ICD-10-CM | POA: Diagnosis not present

## 2020-08-17 DIAGNOSIS — Z800 Family history of malignant neoplasm of digestive organs: Secondary | ICD-10-CM

## 2020-08-17 DIAGNOSIS — Z86010 Personal history of colonic polyps: Secondary | ICD-10-CM

## 2020-08-17 DIAGNOSIS — Z01818 Encounter for other preprocedural examination: Secondary | ICD-10-CM | POA: Insufficient documentation

## 2020-08-17 DIAGNOSIS — Z20822 Contact with and (suspected) exposure to covid-19: Secondary | ICD-10-CM | POA: Diagnosis not present

## 2020-08-17 HISTORY — DX: Gastro-esophageal reflux disease without esophagitis: K21.9

## 2020-08-17 HISTORY — DX: Sleep apnea, unspecified: G47.30

## 2020-08-17 HISTORY — DX: Type 2 diabetes mellitus without complications: E11.9

## 2020-08-17 LAB — BASIC METABOLIC PANEL
Anion gap: 10 (ref 5–15)
BUN: 8 mg/dL (ref 6–20)
CO2: 25 mmol/L (ref 22–32)
Calcium: 8.8 mg/dL — ABNORMAL LOW (ref 8.9–10.3)
Chloride: 100 mmol/L (ref 98–111)
Creatinine, Ser: 0.5 mg/dL (ref 0.44–1.00)
GFR, Estimated: >60 mL/min (ref >60)
Glucose, Bld: 227 mg/dL — ABNORMAL HIGH (ref 70–99)
Potassium: 3.6 mmol/L (ref 3.5–5.1)
Sodium: 135 mmol/L (ref 135–145)

## 2020-08-17 LAB — SARS CORONAVIRUS 2 (TAT 6-24 HRS): SARS Coronavirus 2: NEGATIVE

## 2020-08-18 ENCOUNTER — Encounter (HOSPITAL_COMMUNITY)
Admission: RE | Disposition: A | Discharge status: Home / Self Care | Payer: Self-pay | Source: Home / Self Care | Attending: Internal Medicine

## 2020-08-18 ENCOUNTER — Ambulatory Visit (HOSPITAL_COMMUNITY): Payer: PRIVATE HEALTH INSURANCE | Admitting: Certified Registered"

## 2020-08-18 ENCOUNTER — Ambulatory Visit (HOSPITAL_COMMUNITY)
Admission: RE | Admit: 2020-08-18 | Discharge: 2020-08-18 | Disposition: A | Discharge status: Home / Self Care | Payer: PRIVATE HEALTH INSURANCE | Attending: Internal Medicine | Admitting: Internal Medicine

## 2020-08-18 ENCOUNTER — Encounter (HOSPITAL_COMMUNITY): Payer: Self-pay | Admitting: Internal Medicine

## 2020-08-18 DIAGNOSIS — Z881 Allergy status to other antibiotic agents status: Secondary | ICD-10-CM | POA: Insufficient documentation

## 2020-08-18 DIAGNOSIS — K219 Gastro-esophageal reflux disease without esophagitis: Secondary | ICD-10-CM | POA: Insufficient documentation

## 2020-08-18 DIAGNOSIS — D120 Benign neoplasm of cecum: Secondary | ICD-10-CM | POA: Insufficient documentation

## 2020-08-18 DIAGNOSIS — Z823 Family history of stroke: Secondary | ICD-10-CM | POA: Insufficient documentation

## 2020-08-18 DIAGNOSIS — Z8349 Family history of other endocrine, nutritional and metabolic diseases: Secondary | ICD-10-CM | POA: Insufficient documentation

## 2020-08-18 DIAGNOSIS — K6289 Other specified diseases of anus and rectum: Secondary | ICD-10-CM | POA: Insufficient documentation

## 2020-08-18 DIAGNOSIS — Z79899 Other long term (current) drug therapy: Secondary | ICD-10-CM | POA: Insufficient documentation

## 2020-08-18 DIAGNOSIS — I10 Essential (primary) hypertension: Secondary | ICD-10-CM | POA: Diagnosis not present

## 2020-08-18 DIAGNOSIS — E119 Type 2 diabetes mellitus without complications: Secondary | ICD-10-CM | POA: Diagnosis not present

## 2020-08-18 DIAGNOSIS — K5730 Diverticulosis of large intestine without perforation or abscess without bleeding: Secondary | ICD-10-CM | POA: Diagnosis not present

## 2020-08-18 DIAGNOSIS — Z1211 Encounter for screening for malignant neoplasm of colon: Secondary | ICD-10-CM | POA: Insufficient documentation

## 2020-08-18 DIAGNOSIS — Z8249 Family history of ischemic heart disease and other diseases of the circulatory system: Secondary | ICD-10-CM | POA: Diagnosis not present

## 2020-08-18 DIAGNOSIS — Z833 Family history of diabetes mellitus: Secondary | ICD-10-CM | POA: Insufficient documentation

## 2020-08-18 DIAGNOSIS — Z86010 Personal history of colonic polyps: Secondary | ICD-10-CM | POA: Insufficient documentation

## 2020-08-18 DIAGNOSIS — Z794 Long term (current) use of insulin: Secondary | ICD-10-CM | POA: Diagnosis not present

## 2020-08-18 DIAGNOSIS — Z800 Family history of malignant neoplasm of digestive organs: Secondary | ICD-10-CM | POA: Insufficient documentation

## 2020-08-18 DIAGNOSIS — D123 Benign neoplasm of transverse colon: Secondary | ICD-10-CM | POA: Diagnosis not present

## 2020-08-18 HISTORY — PX: POLYPECTOMY: SHX149

## 2020-08-18 HISTORY — PX: COLONOSCOPY WITH PROPOFOL: SHX5780

## 2020-08-18 LAB — GLUCOSE, CAPILLARY
Glucose-Capillary: 148 mg/dL — ABNORMAL HIGH (ref 70–99)
Glucose-Capillary: 100 mg/dL — ABNORMAL HIGH (ref 70–99)

## 2020-08-18 LAB — HM COLONOSCOPY

## 2020-08-18 SURGERY — COLONOSCOPY WITH PROPOFOL
Anesthesia: General

## 2020-08-18 MED ORDER — PROPOFOL 10 MG/ML IV BOLUS
INTRAVENOUS | Status: DC | PRN
  Administered 2020-08-18: 100 mg via INTRAVENOUS
  Administered 2020-08-18: 150 ug/kg/min via INTRAVENOUS

## 2020-08-18 MED ORDER — LACTATED RINGERS IV SOLN: INTRAVENOUS | Status: DC | PRN

## 2020-08-18 MED ORDER — CHLORHEXIDINE GLUCONATE CLOTH 2 % EX PADS: 6.0000 | MEDICATED_PAD | Freq: Once | CUTANEOUS | Status: DC

## 2020-08-18 MED ORDER — STERILE WATER FOR IRRIGATION IR SOLN
Status: DC | PRN
  Administered 2020-08-18: 100 mL

## 2020-08-18 NOTE — Anesthesia Preprocedure Evaluation (Signed)
Anesthesia Evaluation  Patient identified by MRN, date of birth, ID band Patient awake    Reviewed: Allergy & Precautions, H&P , NPO status , Patient's Chart, lab work & pertinent test results, reviewed documented beta blocker date and time   Airway Mallampati: II  TM Distance: >3 FB Neck ROM: full    Dental no notable dental hx.    Pulmonary sleep apnea ,    Pulmonary exam normal breath sounds clear to auscultation       Cardiovascular Exercise Tolerance: Good hypertension, negative cardio ROS   Rhythm:regular Rate:Normal     Neuro/Psych PSYCHIATRIC DISORDERS Anxiety  Neuromuscular disease    GI/Hepatic Neg liver ROS, GERD  Medicated,  Endo/Other  diabetes, Type obesity  Renal/GU negative Renal ROS  negative genitourinary   Musculoskeletal   Abdominal   Peds  Hematology negative hematology ROS (+)   Anesthesia Other Findings   Reproductive/Obstetrics negative OB ROS                             Anesthesia Physical Anesthesia Plan  ASA: II  Anesthesia Plan: General   Post-op Pain Management:    Induction:   PONV Risk Score and Plan: Propofol infusion  Airway Management Planned:   Additional Equipment:   Intra-op Plan:   Post-operative Plan:   Informed Consent: I have reviewed the patients History and Physical, chart, labs and discussed the procedure including the risks, benefits and alternatives for the proposed anesthesia with the patient or authorized representative who has indicated his/her understanding and acceptance.     Dental Advisory Given  Plan Discussed with: CRNA  Anesthesia Plan Comments:         Anesthesia Quick Evaluation

## 2020-08-18 NOTE — Discharge Instructions (Signed)
No aspirin or NSAIDs for 24 hours. Resume usual medications and diet as before. No driving for 24 hours. Physician will call with biopsy results. Next colonoscopy in 5 years.      Colonoscopy, Adult, Care After This sheet gives you information about how to care for yourself after your procedure. Your doctor may also give you more specific instructions. If you have problems or questions, call your doctor. What can I expect after the procedure? After the procedure, it is common to have:  A small amount of blood in your poop (stool) for 24 hours.  Some gas.  Mild cramping or bloating in your belly (abdomen). Follow these instructions at home: Eating and drinking   Drink enough fluid to keep your pee (urine) pale yellow.  Follow instructions from your doctor about what you cannot eat or drink.  Return to your normal diet as told by your doctor. Avoid heavy or fried foods that are hard to digest. Activity  Rest as told by your doctor.  Do not sit for a long time without moving. Get up to take short walks every 1-2 hours. This is important. Ask for help if you feel weak or unsteady.  Return to your normal activities as told by your doctor. Ask your doctor what activities are safe for you. To help cramping and bloating:   Try walking around.  Put heat on your belly as told by your doctor. Use the heat source that your doctor recommends, such as a moist heat pack or a heating pad. ? Put a towel between your skin and the heat source. ? Leave the heat on for 20-30 minutes. ? Remove the heat if your skin turns bright red. This is very important if you are unable to feel pain, heat, or cold. You may have a greater risk of getting burned. General instructions  For the first 24 hours after the procedure: ? Do not drive or use machinery. ? Do not sign important documents. ? Do not drink alcohol. ? Do your daily activities more slowly than normal. ? Eat foods that are soft and  easy to digest.  Take over-the-counter or prescription medicines only as told by your doctor.  Keep all follow-up visits as told by your doctor. This is important. Contact a doctor if:  You have blood in your poop 2-3 days after the procedure. Get help right away if:  You have more than a small amount of blood in your poop.  You see large clumps of tissue (blood clots) in your poop.  Your belly is swollen.  You feel like you may vomit (nauseous).  You vomit.  You have a fever.  You have belly pain that gets worse, and medicine does not help your pain. Summary  After the procedure, it is common to have a small amount of blood in your poop. You may also have mild cramping and bloating in your belly.  For the first 24 hours after the procedure, do not drive or use machinery, do not sign important documents, and do not drink alcohol.  Get help right away if you have a lot of blood in your poop, feel like you may vomit, have a fever, or have more belly pain. This information is not intended to replace advice given to you by your health care provider. Make sure you discuss any questions you have with your health care provider. Document Revised: 03/03/2019 Document Reviewed: 03/03/2019 Elsevier Patient Education  2020 [employer]  

## 2020-08-18 NOTE — Anesthesia Postprocedure Evaluation (Signed)
Anesthesia Post Note  Patient: Susan Becker  Procedure(s) Performed: COLONOSCOPY WITH PROPOFOL (N/A ) POLYPECTOMY INTESTINAL  Patient location during evaluation: PACU Anesthesia Type: General Level of consciousness: awake, oriented, awake and alert and patient cooperative Pain management: pain level controlled Vital Signs Assessment: post-procedure vital signs reviewed and stable Respiratory status: spontaneous breathing, respiratory function stable and nonlabored ventilation Cardiovascular status: blood pressure returned to baseline and stable Postop Assessment: no headache and no backache Anesthetic complications: no   No complications documented.   Last Vitals:  Vitals:   08/18/20 0834  BP: (!) 180/86  Resp: 17  Temp: 37.1 C  SpO2: 97%    Last Pain:  Vitals:   08/18/20 0948  TempSrc:   PainSc: 0-No pain                 Brynda Peon

## 2020-08-18 NOTE — H&P (Signed)
Susan Becker is an 59 y.o. female.   Chief Complaint: Patient is here for colonoscopy. HPI: Patient is 59 year old Caucasian female who had colonoscopy in April 2016 with removal of 10 mm tubular adenoma.  She is now returning for surveillance colonoscopy.  She has no GI complaints.  She denies abdominal pain change in bowel habits or rectal bleeding. Raquel Sarna history significant for CRC in paternal grandmother who was 12 at the time of diagnosis.  Her sister was diagnosed with colon carcinoma at age 24 this year when she presented with perforation.  She had never been screened before. She does not take aspirin or anticoagulants.  Past Medical History:  Diagnosis Date  . Diabetes mellitus without complication ([place])   . GERD (gastroesophageal reflux disease)   . HTN (hypertension) 09/2010  . Impaired fasting glucose 04/2006  . PCOS (polycystic ovarian syndrome)    per gyn  . Sleep apnea     Past Surgical History:  Procedure Laterality Date  . COLONOSCOPY N/A 12/16/2014   Procedure: COLONOSCOPY;  Surgeon: Rogene Houston, MD;  Location: [facility];  Service: Endoscopy;  Laterality: N/A;  39    Family History  Problem Relation Age of Onset  . Hypertension Mother   . Diabetes Mother   . Hypertension Father   . Hyperlipidemia Father   . Stroke Father   . Cancer Paternal Grandmother 77       colon  . Stroke Paternal Grandfather   . Hypertension Maternal Grandmother   . Stroke Maternal Grandmother   . Hypertension Maternal Grandfather   . Stroke Maternal Grandfather    Social History:  reports that she has never smoked. She has never used smokeless tobacco. She reports that she does not drink alcohol and does not use drugs.  Allergies:  Allergies  Allergen Reactions  . Zithromax [Azithromycin]     Doesn't work    Medications Prior to Admission  Medication Sig Dispense Refill  . ascorbic acid (VITAMIN C) 500 MG tablet Take 500 mg by mouth daily.    . blood glucose meter kit  and supplies KIT Dispense based on patient and insurance preference. Use to check blood sugar BID. ICD 10 code E11.9 1 each 0  . cholecalciferol (VITAMIN D3) 25 MCG (1000 UNIT) tablet Take 1,000 Units by mouth daily.    . Continuous Blood Gluc Sensor (FREESTYLE LIBRE 14 DAY SENSOR) MISC Apply to skin as directed. Remove and replace every 14 days. 2 each 11  . Dulaglutide (TRULICITY) 5.32 YE/3.3ID SOPN Inject 0.75 mg subcu once a week for diabetes (Patient taking differently: Inject 0.75 mg into the skin every Tuesday. Inject 0.75 mg subcu once a week for diabetes) 2 mL 2  . escitalopram (LEXAPRO) 20 MG tablet TAKE 1 TABLET(20 MG) BY MOUTH DAILY (Patient taking differently: Take 20 mg by mouth daily.) 90 tablet 1  . glucose blood (CONTOUR NEXT TEST) test strip Use as instructed once daily 50 each 11  . insulin glargine (LANTUS SOLOSTAR) 100 UNIT/ML Solostar Pen Inject 10 Units into the skin daily. (Patient taking differently: Inject 16 Units into the skin daily.) 15 mL 0  . Insulin Pen Needle (PEN NEEDLES) 31G X 5 MM MISC Use daily with insulin pen as directed 100 each 0  . losartan (COZAAR) 100 MG tablet TAKE 1 TABLET(100 MG) BY MOUTH DAILY 90 tablet 0  . metFORMIN (GLUCOPHAGE) 1000 MG tablet TAKE 1 TABLET BY MOUTH TWICE DAILY WITH A MEAL (Patient taking differently: Take 1,000 mg  by mouth 2 (two) times daily with a meal. TAKE 1 TABLET BY MOUTH TWICE DAILY with a meal) 60 tablet 2  . pantoprazole (PROTONIX) 40 MG tablet Take 1 tablet (40 mg total) by mouth daily. For acid reflux 30 tablet 2  . phentermine (ADIPEX-P) 37.5 MG tablet Take 1 tablet (37.5 mg total) by mouth daily before breakfast. 30 tablet 2  . rosuvastatin (CRESTOR) 5 MG tablet Take 1 tablet (5 mg total) by mouth daily. For cholesterol 90 tablet 0  . zinc gluconate 50 MG tablet Take 50 mg by mouth daily.      Results for orders placed or performed during the hospital encounter of 08/18/20 (from the past 48 hour(s))  Glucose, capillary      Status: Abnormal   Collection Time: 08/18/20  8:37 AM  Result Value Ref Range   Glucose-Capillary 148 (H) 70 - 99 mg/dL    Comment: Glucose reference range applies only to samples taken after fasting for at least 8 hours.   No results found.  Review of Systems  Blood pressure (!) 180/86, temperature 98.8 F (37.1 C), temperature source Oral, resp. rate 17, last menstrual period 06/21/2013, SpO2 97 %. Physical Exam HENT:     Mouth/Throat:     Mouth: Mucous membranes are moist.     Pharynx: Oropharynx is clear.  Eyes:     General: No scleral icterus.    Conjunctiva/sclera: Conjunctivae normal.  Cardiovascular:     Rate and Rhythm: Normal rate and regular rhythm.     Heart sounds: Normal heart sounds. No murmur heard.   Pulmonary:     Effort: Pulmonary effort is normal.     Breath sounds: Normal breath sounds.  Abdominal:     Comments: Abdomen is full but soft and nontender without organomegaly or masses.  Musculoskeletal:        General: No swelling.     Cervical back: Neck supple.  Lymphadenopathy:     Cervical: No cervical adenopathy.  Skin:    General: Skin is warm and dry.  Neurological:     Mental Status: She is alert.      Assessment/Plan  History of colonic adenoma Family history of CRC in first and second-degree relative. Surveillance colonoscopy.  Hildred Laser, MD 08/18/2020, 9:45 AM

## 2020-08-18 NOTE — Transfer of Care (Signed)
Immediate Anesthesia Transfer of Care Note  Patient: Susan Becker  Procedure(s) Performed: COLONOSCOPY WITH PROPOFOL (N/A ) POLYPECTOMY INTESTINAL  Patient Location: PACU  Anesthesia Type:General  Level of Consciousness: awake, alert , oriented and patient cooperative  Airway & Oxygen Therapy: Patient Spontanous Breathing  Post-op Assessment: Report given to RN, Post -op Vital signs reviewed and stable and Patient moving all extremities  Post vital signs: Reviewed and stable  Last Vitals:  Vitals Value Taken Time  BP 163/81 08/18/20 1022  Temp    Pulse 76 08/18/20 1024  Resp 21 08/18/20 1024  SpO2 97 % 08/18/20 1024  Vitals shown include unvalidated device data.  Last Pain:  Vitals:   08/18/20 0948  TempSrc:   PainSc: 0-No pain      Patients Stated Pain Goal: 6 (08/18/20 0834)  Complications: No complications documented.

## 2020-08-18 NOTE — Op Note (Signed)
[facility] Patient Name: Susan Becker Procedure Date: 08/18/2020 9:31 AM MRN: [id number] Date of Birth: 1960/11/13 Attending MD: Hildred Laser , MD CSN: [id number] Age: 59 Admit Type: Outpatient Procedure:                Colonoscopy Indications:              High risk colon cancer surveillance: Personal                            history of colonic polyps Providers:                Hildred Laser, MD, Crystal Page, Raphael Gibney,                            Technician Referring MD:             Elayne Snare. Luking, MD Medicines:                Propofol per Anesthesia Complications:            No immediate complications. Estimated Blood Loss:     Estimated blood loss: none. Estimated blood loss                            was minimal. Procedure:                Pre-Anesthesia Assessment:                           - Prior to the procedure, a History and Physical                            was performed, and patient medications and                            allergies were reviewed. The patient's tolerance of                            previous anesthesia was also reviewed. The risks                            and benefits of the procedure and the sedation                            options and risks were discussed with the patient.                            All questions were answered, and informed consent                            was obtained. Prior Anticoagulants: The patient has                            taken no previous anticoagulant or antiplatelet                            agents. ASA Grade  Assessment: II - A patient with                            mild systemic disease. After reviewing the risks                            and benefits, the patient was deemed in                            satisfactory condition to undergo the procedure.                           After obtaining informed consent, the colonoscope                            was passed under direct vision.  Throughout the                            procedure, the patient's blood pressure, pulse, and                            oxygen saturations were monitored continuously. The                            [id number] was introduced through the                            anus and advanced to the the cecum, identified by                            appendiceal orifice and ileocecal valve. The                            colonoscopy was performed without difficulty. The                            patient tolerated the procedure well. The quality                            of the bowel preparation was excellent. The                            ileocecal valve, appendiceal orifice, and rectum                            were photographed. Scope In: 9:53:34 AM Scope Out: 10:18:25 AM Scope Withdrawal Time: 0 hours 17 minutes 26 seconds  Total Procedure Duration: 0 hours 24 minutes 51 seconds  Findings:      The perianal and digital rectal examinations were normal.      Two polyps were found in the cecum. The polyps were 4 to 8 mm in size.       These polyps were removed with a cold snare. Resection and retrieval       were complete. The pathology specimen was placed into  Bottle Number 1.      A 7 mm polyp was found in the hepatic flexure. The polyp was sessile.       The polyp was removed with a cold snare. Resection and retrieval were       complete. The pathology specimen was placed into Bottle Number 2.      A few small-mouthed diverticula were found in the sigmoid colon.      Anal papilla(e) were hypertrophied. Impression:               - Two 4 to 8 mm polyps in the cecum, removed with a                            cold snare. Resected and retrieved.                           - One 7 mm polyp at the hepatic flexure, removed                            with a cold snare. Resected and retrieved.                           - Diverticulosis in the sigmoid colon.                           - Anal  papilla(e) were hypertrophied. Moderate Sedation:      Per Anesthesia Care Recommendation:           - Patient has a contact number available for                            emergencies. The signs and symptoms of potential                            delayed complications were discussed with the                            patient. Return to normal activities tomorrow.                            Written discharge instructions were provided to the                            patient.                           - Resume previous diet today.                           - Continue present medications.                           - No aspirin, ibuprofen, naproxen, or other                            non-steroidal anti-inflammatory drugs for 1 day.                           -  Await pathology results.                           - Repeat colonoscopy in 5 years for surveillance. Procedure Code(s):        --- Professional ---                           [phone], Colonoscopy, flexible; with removal of                            tumor(s), polyp(s), or other lesion(s) by snare                            technique Diagnosis Code(s):        --- Professional ---                           K63.5, Polyp of colon                           Z86.010, Personal history of colonic polyps                           K62.89, Other specified diseases of anus and rectum                           K57.30, Diverticulosis of large intestine without                            perforation or abscess without bleeding CPT copyright 2019 [employer]. All rights reserved. The codes documented in this report are preliminary and upon coder review may  be revised to meet current compliance requirements. Lionel December, MD Lionel December, MD 08/18/2020 10:27:32 AM This report has been signed electronically. Number of Addenda: 0

## 2020-08-19 ENCOUNTER — Encounter: Payer: Self-pay | Admitting: Nurse Practitioner

## 2020-08-19 ENCOUNTER — Other Ambulatory Visit: Payer: Self-pay | Admitting: Nurse Practitioner

## 2020-08-19 LAB — SURGICAL PATHOLOGY

## 2020-08-19 MED ORDER — FLUCONAZOLE 150 MG PO TABS
ORAL_TABLET | ORAL | 0 refills | Status: DC
Start: 2020-08-19 — End: 2020-12-10

## 2020-08-24 ENCOUNTER — Encounter (HOSPITAL_COMMUNITY): Payer: Self-pay | Admitting: Internal Medicine

## 2020-08-24 ENCOUNTER — Encounter (INDEPENDENT_AMBULATORY_CARE_PROVIDER_SITE_OTHER): Payer: Self-pay | Admitting: *Deleted

## 2020-09-13 ENCOUNTER — Encounter: Payer: Self-pay | Admitting: Nurse Practitioner

## 2020-09-14 ENCOUNTER — Encounter: Payer: Self-pay | Admitting: Nurse Practitioner

## 2020-09-14 NOTE — Telephone Encounter (Signed)
Nurses I would recommend bumping up the dose of the long-acting insulin from 20 units to a new dosing of 24 units  Also I think it would be beneficial for patient to do a consultation regarding this.  May need additional medications added.  The pros and the cons can be discussed regarding these medicines.  This type of discussion would be best held via a consultation.  This can be virtual.  I could do that Wednesday morning if Langley Gauss has availability to do so.

## 2020-09-14 NOTE — Telephone Encounter (Signed)
Please schedule pt for tomorrow. See dr scott's note below

## 2020-09-15 ENCOUNTER — Other Ambulatory Visit: Payer: Self-pay

## 2020-09-15 ENCOUNTER — Ambulatory Visit (INDEPENDENT_AMBULATORY_CARE_PROVIDER_SITE_OTHER): Payer: PRIVATE HEALTH INSURANCE | Admitting: Family Medicine

## 2020-09-15 VITALS — BP 126/82 | Temp 98.4°F | Ht 64.5 in | Wt 256.0 lb

## 2020-09-15 DIAGNOSIS — I10 Essential (primary) hypertension: Secondary | ICD-10-CM

## 2020-09-15 DIAGNOSIS — E785 Hyperlipidemia, unspecified: Secondary | ICD-10-CM | POA: Insufficient documentation

## 2020-09-15 DIAGNOSIS — E1169 Type 2 diabetes mellitus with other specified complication: Secondary | ICD-10-CM

## 2020-09-15 MED ORDER — DAPAGLIFLOZIN PROPANEDIOL 5 MG PO TABS: 5.0000 mg | ORAL_TABLET | Freq: Every day | ORAL | 1 refills | Status: DC

## 2020-09-15 NOTE — Progress Notes (Signed)
   Subjective:    Patient ID: Susan Becker, female    DOB: 1961-02-02, 60 y.o.   MRN: [id number]  HPI  Patient arrives to discuss elevated blood sugars. Just increased insulin to 24 units a day yesterday. Patient's readings are in the moderately high level Patient did not tolerate Trulicity because of severe nausea and intestinal upset readings are moderately elevated Her diet could be better than what it is currently She watches her diet to some degree but consumes too many carbohydrates Is not exercising at the level that she should in order to get this under better control Review of Systems Please see above    Objective:   Physical Exam Lungs clear respiratory normal heart regular pulse normal BP good       Assessment & Plan:  Diabetes subpar control We did discuss other options Start Farxiga side effects were discussed including increased risk of yeast infections  Patient to check glucoses periodically and send [place] an update Patient to improve dietary intake with less carbohydrates Patient to increase physical exercise as well Patient will do lab work before her follow-up visit

## 2020-09-15 NOTE — Patient Instructions (Signed)
Diabetes Mellitus and Nutrition, Adult When you have diabetes, or diabetes mellitus, it is very important to have healthy eating habits because your blood sugar (glucose) levels are greatly affected by what you eat and drink. Eating healthy foods in the right amounts, at about the same times every day, can help you:  Control your blood glucose.  Lower your risk of heart disease.  Improve your blood pressure.  Reach or maintain a healthy weight. What can affect my meal plan? Every person with diabetes is different, and each person has different needs for a meal plan. Your health care provider may recommend that you work with a dietitian to make a meal plan that is best for you. Your meal plan may vary depending on factors such as:  The calories you need.  The medicines you take.  Your weight.  Your blood glucose, blood pressure, and cholesterol levels.  Your activity level.  Other health conditions you have, such as heart or kidney disease. How do carbohydrates affect me? Carbohydrates, also called carbs, affect your blood glucose level more than any other type of food. Eating carbs naturally raises the amount of glucose in your blood. Carb counting is a method for keeping track of how many carbs you eat. Counting carbs is important to keep your blood glucose at a healthy level, especially if you use insulin or take certain oral diabetes medicines. It is important to know how many carbs you can safely have in each meal. This is different for every person. Your dietitian can help you calculate how many carbs you should have at each meal and for each snack. How does alcohol affect me? Alcohol can cause a sudden decrease in blood glucose (hypoglycemia), especially if you use insulin or take certain oral diabetes medicines. Hypoglycemia can be a life-threatening condition. Symptoms of hypoglycemia, such as sleepiness, dizziness, and confusion, are similar to symptoms of having too much  alcohol.  Do not drink alcohol if: ? Your health care provider tells you not to drink. ? You are pregnant, may be pregnant, or are planning to become pregnant.  If you drink alcohol: ? Do not drink on an empty stomach. ? Limit how much you use to:  0-1 drink a day for women.  0-2 drinks a day for men. ? Be aware of how much alcohol is in your drink. In the [place] one drink equals one 12 oz bottle of beer (355 mL), one 5 oz glass of wine (148 mL), or one 1 oz glass of hard liquor (44 mL). ? Keep yourself hydrated with water, diet soda, or unsweetened iced tea.  Keep in mind that regular soda, juice, and other mixers may contain a lot of sugar and must be counted as carbs. What are tips for following this plan? Reading food labels  Start by checking the serving size on the "Nutrition Facts" label of packaged foods and drinks. The amount of calories, carbs, fats, and other nutrients listed on the label is based on one serving of the item. Many items contain more than one serving per package.  Check the total grams (g) of carbs in one serving. You can calculate the number of servings of carbs in one serving by dividing the total carbs by 15. For example, if a food has 30 g of total carbs per serving, it would be equal to 2 servings of carbs.  Check the number of grams (g) of saturated fats and trans fats in one serving. Choose foods that have   a low amount or none of these fats.  Check the number of milligrams (mg) of salt (sodium) in one serving. Most people should limit total sodium intake to less than 2,300 mg per day.  Always check the nutrition information of foods labeled as "low-fat" or "nonfat." These foods may be higher in added sugar or refined carbs and should be avoided.  Talk to your dietitian to identify your daily goals for nutrients listed on the label. Shopping  Avoid buying canned, pre-made, or processed foods. These foods tend to be high in fat, sodium, and added  sugar.  Shop around the outside edge of the grocery store. This is where you will most often find fresh fruits and vegetables, bulk grains, fresh meats, and fresh dairy. Cooking  Use low-heat cooking methods, such as baking, instead of high-heat cooking methods like deep frying.  Cook using healthy oils, such as olive, canola, or sunflower oil.  Avoid cooking with butter, cream, or high-fat meats. Meal planning  Eat meals and snacks regularly, preferably at the same times every day. Avoid going long periods of time without eating.  Eat foods that are high in fiber, such as fresh fruits, vegetables, beans, and whole grains. Talk with your dietitian about how many servings of carbs you can eat at each meal.  Eat 4-6 oz (112-168 g) of lean protein each day, such as lean meat, chicken, fish, eggs, or tofu. One ounce (oz) of lean protein is equal to: ? 1 oz (28 g) of meat, chicken, or fish. ? 1 egg. ?  cup (62 g) of tofu.  Eat some foods each day that contain healthy fats, such as avocado, nuts, seeds, and fish.   What foods should I eat? Fruits Berries. Apples. Oranges. Peaches. Apricots. Plums. Grapes. Mango. Papaya. Pomegranate. Kiwi. Cherries. Vegetables Lettuce. Spinach. Leafy greens, including kale, chard, collard greens, and mustard greens. Beets. Cauliflower. Cabbage. Broccoli. Carrots. Green beans. Tomatoes. Peppers. Onions. Cucumbers. Brussels sprouts. Grains Whole grains, such as whole-wheat or whole-grain bread, crackers, tortillas, cereal, and pasta. Unsweetened oatmeal. Quinoa. Brown or wild rice. Meats and other proteins Seafood. Poultry without skin. Lean cuts of poultry and beef. Tofu. Nuts. Seeds. Dairy Low-fat or fat-free dairy products such as milk, yogurt, and cheese. The items listed above may not be a complete list of foods and beverages you can eat. Contact a dietitian for more information. What foods should I avoid? Fruits Fruits canned with  syrup. Vegetables Canned vegetables. Frozen vegetables with butter or cream sauce. Grains Refined white flour and flour products such as bread, pasta, snack foods, and cereals. Avoid all processed foods. Meats and other proteins Fatty cuts of meat. Poultry with skin. Breaded or fried meats. Processed meat. Avoid saturated fats. Dairy Full-fat yogurt, cheese, or milk. Beverages Sweetened drinks, such as soda or iced tea. The items listed above may not be a complete list of foods and beverages you should avoid. Contact a dietitian for more information. Questions to ask a health care provider  Do I need to meet with a diabetes educator?  Do I need to meet with a dietitian?  What number can I call if I have questions?  When are the best times to check my blood glucose? Where to find more information:  American Diabetes Association: diabetes.org  Academy of Nutrition and Dietetics: www.eatright.org  National Institute of Diabetes and Digestive and Kidney Diseases: www.niddk.nih.gov  Association of Diabetes Care and Education Specialists: www.diabeteseducator.org Summary  It is important to have healthy eating   habits because your blood sugar (glucose) levels are greatly affected by what you eat and drink.  A healthy meal plan will help you control your blood glucose and maintain a healthy lifestyle.  Your health care provider may recommend that you work with a dietitian to make a meal plan that is best for you.  Keep in mind that carbohydrates (carbs) and alcohol have immediate effects on your blood glucose levels. It is important to count carbs and to use alcohol carefully. This information is not intended to replace advice given to you by your health care provider. Make sure you discuss any questions you have with your health care provider. Document Revised: 07/15/2019 Document Reviewed: 07/15/2019 Elsevier Patient Education  2021 Elsevier Inc.  

## 2020-09-18 ENCOUNTER — Other Ambulatory Visit: Payer: Self-pay | Admitting: Nurse Practitioner

## 2020-09-20 ENCOUNTER — Other Ambulatory Visit: Payer: Self-pay | Admitting: *Deleted

## 2020-09-20 ENCOUNTER — Encounter: Payer: Self-pay | Admitting: *Deleted

## 2020-09-20 DIAGNOSIS — I10 Essential (primary) hypertension: Secondary | ICD-10-CM

## 2020-09-20 DIAGNOSIS — E785 Hyperlipidemia, unspecified: Secondary | ICD-10-CM

## 2020-09-20 DIAGNOSIS — E119 Type 2 diabetes mellitus without complications: Secondary | ICD-10-CM

## 2020-09-20 NOTE — Progress Notes (Signed)
Orders put in and pt was notified through [facility]

## 2020-09-22 ENCOUNTER — Other Ambulatory Visit: Payer: Self-pay | Admitting: Nurse Practitioner

## 2020-09-23 ENCOUNTER — Other Ambulatory Visit: Payer: Self-pay | Admitting: *Deleted

## 2020-09-23 MED ORDER — LANTUS SOLOSTAR 100 UNIT/ML ~~LOC~~ SOPN: 24.0000 [IU] | PEN_INJECTOR | Freq: Every day | SUBCUTANEOUS | 0 refills | Status: DC

## 2020-09-24 MED ORDER — LANTUS SOLOSTAR 100 UNIT/ML ~~LOC~~ SOPN: 24.0000 [IU] | PEN_INJECTOR | Freq: Every day | SUBCUTANEOUS | 0 refills | Status: DC

## 2020-10-08 ENCOUNTER — Ambulatory Visit: Payer: PRIVATE HEALTH INSURANCE | Admitting: Nurse Practitioner

## 2020-10-08 ENCOUNTER — Other Ambulatory Visit: Payer: Self-pay | Admitting: Nurse Practitioner

## 2020-10-14 ENCOUNTER — Other Ambulatory Visit: Payer: Self-pay | Admitting: Family Medicine

## 2020-11-08 ENCOUNTER — Other Ambulatory Visit: Payer: Self-pay | Admitting: Nurse Practitioner

## 2020-11-14 ENCOUNTER — Other Ambulatory Visit: Payer: Self-pay | Admitting: Family Medicine

## 2020-11-16 ENCOUNTER — Other Ambulatory Visit: Payer: Self-pay | Admitting: Family Medicine

## 2020-12-03 LAB — LIPID PANEL
Chol/HDL Ratio: 4.5 ratio — ABNORMAL HIGH (ref 0.0–4.4)
Cholesterol, Total: 190 mg/dL (ref 100–199)
HDL: 42 mg/dL (ref >39)
LDL Chol Calc (NIH): 107 mg/dL — ABNORMAL HIGH (ref 0–99)
Triglycerides: 241 mg/dL — ABNORMAL HIGH (ref 0–149)
VLDL Cholesterol Cal: 41 mg/dL — ABNORMAL HIGH (ref 5–40)

## 2020-12-03 LAB — COMPREHENSIVE METABOLIC PANEL
ALT: 13 IU/L (ref 0–32)
AST: 10 IU/L (ref 0–40)
Albumin/Globulin Ratio: 1.5 (ref 1.2–2.2)
Albumin: 4.4 g/dL (ref 3.8–4.9)
Alkaline Phosphatase: 82 IU/L (ref 44–121)
BUN/Creatinine Ratio: 19 (ref 9–23)
BUN: 14 mg/dL (ref 6–24)
Bilirubin Total: 0.3 mg/dL (ref 0.0–1.2)
CO2: 21 mmol/L (ref 20–29)
Calcium: 9.3 mg/dL (ref 8.7–10.2)
Chloride: 102 mmol/L (ref 96–106)
Creatinine, Ser: 0.72 mg/dL (ref 0.57–1.00)
Globulin, Total: 2.9 g/dL (ref 1.5–4.5)
Glucose: 239 mg/dL — ABNORMAL HIGH (ref 65–99)
Potassium: 4.3 mmol/L (ref 3.5–5.2)
Sodium: 141 mmol/L (ref 134–144)
Total Protein: 7.3 g/dL (ref 6.0–8.5)
eGFR: 97 mL/min/{1.73_m2} (ref >59)

## 2020-12-03 LAB — HEMOGLOBIN A1C
Est. average glucose Bld gHb Est-mCnc: 223 mg/dL
Hgb A1c MFr Bld: 9.4 % — ABNORMAL HIGH (ref 4.8–5.6)

## 2020-12-10 ENCOUNTER — Encounter: Payer: Self-pay | Admitting: Nurse Practitioner

## 2020-12-10 ENCOUNTER — Ambulatory Visit (INDEPENDENT_AMBULATORY_CARE_PROVIDER_SITE_OTHER): Payer: PRIVATE HEALTH INSURANCE | Admitting: Nurse Practitioner

## 2020-12-10 ENCOUNTER — Other Ambulatory Visit: Payer: Self-pay

## 2020-12-10 VITALS — BP 128/82 | HR 87 | Temp 97.2°F | Ht 64.5 in | Wt 257.4 lb

## 2020-12-10 DIAGNOSIS — F411 Generalized anxiety disorder: Secondary | ICD-10-CM

## 2020-12-10 DIAGNOSIS — K219 Gastro-esophageal reflux disease without esophagitis: Secondary | ICD-10-CM

## 2020-12-10 DIAGNOSIS — I10 Essential (primary) hypertension: Secondary | ICD-10-CM

## 2020-12-10 DIAGNOSIS — E119 Type 2 diabetes mellitus without complications: Secondary | ICD-10-CM

## 2020-12-10 DIAGNOSIS — R5383 Other fatigue: Secondary | ICD-10-CM | POA: Diagnosis not present

## 2020-12-10 DIAGNOSIS — F430 Acute stress reaction: Secondary | ICD-10-CM

## 2020-12-10 DIAGNOSIS — E785 Hyperlipidemia, unspecified: Secondary | ICD-10-CM

## 2020-12-10 DIAGNOSIS — E1169 Type 2 diabetes mellitus with other specified complication: Secondary | ICD-10-CM

## 2020-12-10 MED ORDER — TERCONAZOLE 0.4 % VA CREA: 1.0000 | TOPICAL_CREAM | Freq: Every day | VAGINAL | 0 refills | Status: DC

## 2020-12-10 MED ORDER — RYBELSUS 3 MG PO TABS: 3.0000 mg | ORAL_TABLET | Freq: Every day | ORAL | 0 refills | Status: AC

## 2020-12-10 NOTE — Patient Instructions (Addendum)
DASH Eating Plan DASH stands for Dietary Approaches to Stop Hypertension. The DASH eating plan is a healthy eating plan that has been shown to:  Reduce high blood pressure (hypertension).  Reduce your risk for type 2 diabetes, heart disease, and stroke.  Help with weight loss. What are tips for following this plan? Reading food labels  Check food labels for the amount of salt (sodium) per serving. Choose foods with less than 5 percent of the Daily Value of sodium. Generally, foods with less than 300 milligrams (mg) of sodium per serving fit into this eating plan.  To find whole grains, look for the word "whole" as the first word in the ingredient list. Shopping  Buy products labeled as "low-sodium" or "no salt added."  Buy fresh foods. Avoid canned foods and pre-made or frozen meals. Cooking  Avoid adding salt when cooking. Use salt-free seasonings or herbs instead of table salt or sea salt. Check with your health care provider or pharmacist before using salt substitutes.  Do not fry foods. Cook foods using healthy methods such as baking, boiling, grilling, roasting, and broiling instead.  Cook with heart-healthy oils, such as olive, canola, avocado, soybean, or sunflower oil. Meal planning  Eat a balanced diet that includes: ? 4 or more servings of fruits and 4 or more servings of vegetables each day. Try to fill one-half of your plate with fruits and vegetables. ? 6-8 servings of whole grains each day. ? Less than 6 oz (170 g) of lean meat, poultry, or fish each day. A 3-oz (85-g) serving of meat is about the same size as a deck of cards. One egg equals 1 oz (28 g). ? 2-3 servings of low-fat dairy each day. One serving is 1 cup (237 mL). ? 1 serving of nuts, seeds, or beans 5 times each week. ? 2-3 servings of heart-healthy fats. Healthy fats called omega-3 fatty acids are found in foods such as walnuts, flaxseeds, fortified milks, and eggs. These fats are also found in  cold-water fish, such as sardines, salmon, and mackerel.  Limit how much you eat of: ? Canned or prepackaged foods. ? Food that is high in trans fat, such as some fried foods. ? Food that is high in saturated fat, such as fatty meat. ? Desserts and other sweets, sugary drinks, and other foods with added sugar. ? Full-fat dairy products.  Do not salt foods before eating.  Do not eat more than 4 egg yolks a week.  Try to eat at least 2 vegetarian meals a week.  Eat more home-cooked food and less restaurant, buffet, and fast food.   Lifestyle  When eating at a restaurant, ask that your food be prepared with less salt or no salt, if possible.  If you drink alcohol: ? Limit how much you use to:  0-1 drink a day for women who are not pregnant.  0-2 drinks a day for men. ? Be aware of how much alcohol is in your drink. In the [place] one drink equals one 12 oz bottle of beer (355 mL), one 5 oz glass of wine (148 mL), or one 1 oz glass of hard liquor (44 mL). General information  Avoid eating more than 2,300 mg of salt a day. If you have hypertension, you may need to reduce your sodium intake to 1,500 mg a day.  Work with your health care provider to maintain a healthy body weight or to lose weight. Ask what an ideal weight is for you.    Get at least 30 minutes of exercise that causes your heart to beat faster (aerobic exercise) most days of the week. Activities may include walking, swimming, or biking.  Work with your health care provider or dietitian to adjust your eating plan to your individual calorie needs. What foods should I eat? Fruits All fresh, dried, or frozen fruit. Canned fruit in natural juice (without added sugar). Vegetables Fresh or frozen vegetables (raw, steamed, roasted, or grilled). Low-sodium or reduced-sodium tomato and vegetable juice. Low-sodium or reduced-sodium tomato sauce and tomato paste. Low-sodium or reduced-sodium canned vegetables. Grains Whole-grain  or whole-wheat bread. Whole-grain or whole-wheat pasta. Brown rice. [place]. Bulgur. Whole-grain and low-sodium cereals. Pita bread. Low-fat, low-sodium crackers. Whole-wheat flour tortillas. Meats and other proteins Skinless chicken or [place]. Ground chicken or [place]. Pork with fat trimmed off. Fish and seafood. Egg whites. Dried beans, peas, or lentils. Unsalted nuts, nut butters, and seeds. Unsalted canned beans. Lean cuts of beef with fat trimmed off. Low-sodium, lean precooked or cured meat, such as sausages or meat loaves. Dairy Low-fat (1%) or fat-free (skim) milk. Reduced-fat, low-fat, or fat-free cheeses. Nonfat, low-sodium ricotta or cottage cheese. Low-fat or nonfat yogurt. Low-fat, low-sodium cheese. Fats and oils Soft margarine without trans fats. Vegetable oil. Reduced-fat, low-fat, or light mayonnaise and salad dressings (reduced-sodium). Canola, safflower, olive, avocado, soybean, and sunflower oils. Avocado. Seasonings and condiments Herbs. Spices. Seasoning mixes without salt. Other foods Unsalted popcorn and pretzels. Fat-free sweets. The items listed above may not be a complete list of foods and beverages you can eat. Contact a dietitian for more information. What foods should I avoid? Fruits Canned fruit in a light or heavy syrup. Fried fruit. Fruit in cream or butter sauce. Vegetables Creamed or fried vegetables. Vegetables in a cheese sauce. Regular canned vegetables (not low-sodium or reduced-sodium). Regular canned tomato sauce and paste (not low-sodium or reduced-sodium). Regular tomato and vegetable juice (not low-sodium or reduced-sodium). Angie Fava. Olives. Grains Baked goods made with fat, such as croissants, muffins, or some breads. Dry pasta or rice meal packs. Meats and other proteins Fatty cuts of meat. Ribs. Fried meat. Berniece Salines. Bologna, salami, and other precooked or cured meats, such as sausages or meat loaves. Fat from the back of a pig (fatback).  Bratwurst. Salted nuts and seeds. Canned beans with added salt. Canned or smoked fish. Whole eggs or egg yolks. Chicken or [place] with skin. Dairy Whole or 2% milk, cream, and half-and-half. Whole or full-fat cream cheese. Whole-fat or sweetened yogurt. Full-fat cheese. Nondairy creamers. Whipped toppings. Processed cheese and cheese spreads. Fats and oils Butter. Stick margarine. Lard. Shortening. Ghee. Bacon fat. Tropical oils, such as coconut, palm kernel, or palm oil. Seasonings and condiments Onion salt, garlic salt, seasoned salt, table salt, and sea salt. Worcestershire sauce. Tartar sauce. Barbecue sauce. Teriyaki sauce. Soy sauce, including reduced-sodium. Steak sauce. Canned and packaged gravies. Fish sauce. Oyster sauce. Cocktail sauce. Store-bought horseradish. Ketchup. Mustard. Meat flavorings and tenderizers. Bouillon cubes. Hot sauces. Pre-made or packaged marinades. Pre-made or packaged taco seasonings. Relishes. Regular salad dressings.    Mediterranean Diet A Mediterranean diet refers to food and lifestyle choices that are based on the traditions of countries located on the [employer]. This way of eating has been shown to help prevent certain conditions and improve outcomes for people who have chronic diseases, like kidney disease and heart disease. What are tips for following this plan? Lifestyle  Cook and eat meals together with your family, when possible.  Drink enough fluid  to keep your urine clear or pale yellow.  Be physically active every day. This includes: ? Aerobic exercise like running or swimming. ? Leisure activities like gardening, walking, or housework.  Get 7-8 hours of sleep each night.  If recommended by your health care provider, drink red wine in moderation. This means 1 glass a day for nonpregnant women and 2 glasses a day for men. A glass of wine equals 5 oz (150 mL). Reading food labels  Check the serving size of packaged foods. For foods  such as rice and pasta, the serving size refers to the amount of cooked product, not dry.  Check the total fat in packaged foods. Avoid foods that have saturated fat or trans fats.  Check the ingredients list for added sugars, such as corn syrup.   Shopping  At the grocery store, buy most of your food from the areas near the walls of the store. This includes: ? Fresh fruits and vegetables (produce). ? Grains, beans, nuts, and seeds. Some of these may be available in unpackaged forms or large amounts (in bulk). ? Fresh seafood. ? Poultry and eggs. ? Low-fat dairy products.  Buy whole ingredients instead of prepackaged foods.  Buy fresh fruits and vegetables in-season from local farmers markets.  Buy frozen fruits and vegetables in resealable bags.  If you do not have access to quality fresh seafood, buy precooked frozen shrimp or canned fish, such as tuna, salmon, or sardines.  Buy small amounts of raw or cooked vegetables, salads, or olives from the deli or salad bar at your store.  Stock your pantry so you always have certain foods on hand, such as olive oil, canned tuna, canned tomatoes, rice, pasta, and beans. Cooking  Cook foods with extra-virgin olive oil instead of using butter or other vegetable oils.  Have meat as a side dish, and have vegetables or grains as your main dish. This means having meat in small portions or adding small amounts of meat to foods like pasta or stew.  Use beans or vegetables instead of meat in common dishes like chili or lasagna.  Experiment with different cooking methods. Try roasting or broiling vegetables instead of steaming or sauteing them.  Add frozen vegetables to soups, stews, pasta, or rice.  Add nuts or seeds for added healthy fat at each meal. You can add these to yogurt, salads, or vegetable dishes.  Marinate fish or vegetables using olive oil, lemon juice, garlic, and fresh herbs. Meal planning  Plan to eat 1 vegetarian meal one  day each week. Try to work up to 2 vegetarian meals, if possible.  Eat seafood 2 or more times a week.  Have healthy snacks readily available, such as: ? Vegetable sticks with hummus. ? [place] yogurt. ? Fruit and nut trail mix.  Eat balanced meals throughout the week. This includes: ? Fruit: 2-3 servings a day ? Vegetables: 4-5 servings a day ? Low-fat dairy: 2 servings a day ? Fish, poultry, or lean meat: 1 serving a day ? Beans and legumes: 2 or more servings a week ? Nuts and seeds: 1-2 servings a day ? Whole grains: 6-8 servings a day ? Extra-virgin olive oil: 3-4 servings a day  Limit red meat and sweets to only a few servings a month   What are my food choices?  Mediterranean diet ? Recommended  Grains: Whole-grain pasta. Brown rice. Bulgar wheat. Polenta. Couscous. Whole-wheat bread. [place].  Vegetables: Artichokes. Beets. Broccoli. Cabbage. Carrots. Eggplant. Green beans. Chard.  Kale. Spinach. Onions. Leeks. Peas. Squash. Tomatoes. Peppers. Radishes.  Fruits: Apples. Apricots. Avocado. Berries. Bananas. Cherries. Dates. Figs. Grapes. Lemons. Melon. Oranges. Peaches. Plums. Pomegranate.  Meats and other protein foods: Beans. Almonds. Sunflower seeds. Pine nuts. Peanuts. [place]. Salmon. Scallops. Shrimp. [place]. Tilapia. Clams. Oysters. Eggs.  Dairy: Low-fat milk. Cheese. Greek yogurt.  Beverages: Water. Red wine. Herbal tea.  Fats and oils: Extra virgin olive oil. Avocado oil. Grape seed oil.  Sweets and desserts: [place] yogurt with honey. Baked apples. Poached pears. Trail mix.  Seasoning and other foods: Basil. Cilantro. Coriander. Cumin. Mint. Parsley. Sage. Rosemary. Tarragon. Garlic. Oregano. Thyme. Pepper. Balsalmic vinegar. Tahini. Hummus. Tomato sauce. Olives. Mushrooms. ? Limit these  Grains: Prepackaged pasta or rice dishes. Prepackaged cereal with added sugar.  Vegetables: Deep fried potatoes (french fries).  Fruits: Fruit canned in syrup.  Meats  and other protein foods: Beef. Pork. Lamb. Poultry with skin. Hot dogs. Berniece Salines.  Dairy: Ice cream. Sour cream. Whole milk.  Beverages: Juice. Sugar-sweetened soft drinks. Beer. Liquor and spirits.  Fats and oils: Butter. Canola oil. Vegetable oil. Beef fat (tallow). Lard.  Sweets and desserts: Cookies. Cakes. Pies. Candy.  Seasoning and other foods: Mayonnaise. Premade sauces and marinades. The items listed may not be a complete list. Talk with your dietitian about what dietary choices are right for you. Summary  The Mediterranean diet includes both food and lifestyle choices.  Eat a variety of fresh fruits and vegetables, beans, nuts, seeds, and whole grains.  Limit the amount of red meat and sweets that you eat.  Talk with your health care provider about whether it is safe for you to drink red wine in moderation. This means 1 glass a day for nonpregnant women and 2 glasses a day for men. A glass of wine equals 5 oz (150 mL). This information is not intended to replace advice given to you by your health care provider. Make sure you discuss any questions you have with your health care provider. Document Revised: 04/06/2016 Document Reviewed: 03/30/2016 Elsevier Patient Education  [place]. Other foods Salted popcorn and pretzels. The items listed above may not be a complete list of foods and beverages you should avoid. Contact a dietitian for more information. Where to find more information  National Heart, Lung, and Blood Institute: https://wilson-eaton.com/  American Heart Association: www.heart.org  Academy of Nutrition and Dietetics: www.eatright.[place]: www.kidney.org Summary  The DASH eating plan is a healthy eating plan that has been shown to reduce high blood pressure (hypertension). It may also reduce your risk for type 2 diabetes, heart disease, and stroke.  When on the DASH eating plan, aim to eat more fresh fruits and vegetables, whole  grains, lean proteins, low-fat dairy, and heart-healthy fats.  With the DASH eating plan, you should limit salt (sodium) intake to 2,300 mg a day. If you have hypertension, you may need to reduce your sodium intake to 1,500 mg a day.  Work with your health care provider or dietitian to adjust your eating plan to your individual calorie needs. This information is not intended to replace advice given to you by your health care provider. Make sure you discuss any questions you have with your health care provider. Document Revised: 07/11/2019 Document Reviewed: 07/11/2019 Elsevier Patient Education  2021 [employer].

## 2020-12-10 NOTE — Progress Notes (Signed)
   Subjective:    Patient ID: Susan Becker, female    DOB: [birth year], 60 y.o.   MRN: [id number]  Diabetes She presents for her follow-up diabetic visit. She has type 2 diabetes mellitus. Risk factors for coronary artery disease include dyslipidemia, diabetes mellitus, post-menopausal and hypertension. Current diabetic treatment includes insulin injections and oral agent (monotherapy). She is compliant with treatment all of the time.      Review of Systems     Objective:   Physical Exam        Assessment & Plan:

## 2020-12-10 NOTE — Progress Notes (Signed)
Subjective:    Patient ID: Georgeann Oppenheim, female    DOB: 01/28/1961, 60 y.o.   MRN: [id number]  HPI  Patient came for medication check up. Patient was prescribed Farxiga 5 mg. States her fasting glucose has been in the 200's. Lowest reading was in the 160's. Has been having polyuria, polydipsia, polyphagia, does having vaginal itching she says is related to reoccuring yeast infection. Did a course of oral Diflucan. Symptoms improved for a few days then came back. Denies: dysuria, hematuria, abnormal discharge, and flank pain.  Same sexual partner.  Patient states she is working on her diet. Difficulty exercising due to hip pain. Is due for yearly  eye exam.  GERD well controlled on daily Pantoprazole.   Review of Systems  Constitutional: Positive for fatigue.  Respiratory: Negative for chest tightness, shortness of breath and wheezing.   Cardiovascular: Negative for chest pain, palpitations and leg swelling.  Gastrointestinal: Negative for abdominal distention, abdominal pain, constipation, diarrhea, nausea and vomiting.  Endocrine: Positive for polydipsia, polyphagia and polyuria.  Genitourinary: Negative for difficulty urinating, dysuria, flank pain, menstrual problem, vaginal bleeding, vaginal discharge and vaginal pain.   Depression screen [facility] 2/9 12/10/2020 09/15/2020 07/09/2020 07/11/2019 09/18/2018  Decreased Interest 0 0 0 0 0  Down, Depressed, Hopeless 0 0 0 0 0  PHQ - 2 Score 0 0 0 0 0  Altered sleeping - - 0 - -  Tired, decreased energy - - 0 - -  Change in appetite - - 0 - -  Feeling bad or failure about yourself  - - 0 - -  Trouble concentrating - - 0 - -  Moving slowly or fidgety/restless - - 0 - -  Suicidal thoughts - - 0 - -  PHQ-9 Score - - 0 - -  Difficult doing work/chores - - Not difficult at all - -   States her Lexapro is working well controlling her anxiety.      Objective:   Physical Exam Vitals and nursing note reviewed.  Constitutional:      General:  She is not in acute distress.    Appearance: Normal appearance. She is not ill-appearing.  Neck:     Comments: Thyroid non tender to palpation. No mass or goiter noted.  Cardiovascular:     Rate and Rhythm: Normal rate and regular rhythm.     Heart sounds: Normal heart sounds.  Pulmonary:     Effort: Pulmonary effort is normal. No respiratory distress.     Breath sounds: Normal breath sounds. No wheezing.  Abdominal:     General: Abdomen is flat. There is no distension.     Palpations: Abdomen is soft.     Tenderness: There is no abdominal tenderness.  Neurological:     Mental Status: She is alert and oriented to person, place, and time.  Psychiatric:        Mood and Affect: Mood normal.        Behavior: Behavior normal.        Thought Content: Thought content normal.        Judgment: Judgment normal.     Today's Vitals   12/10/20 0823  BP: 128/82  Pulse: 87  Temp: (!) 97.2 F (36.2 C)  TempSrc: Oral  SpO2: 97%  Weight: 257 lb 6.4 oz (116.8 kg)  Height: 5' 4.5" (1.638 m)   Body mass index is 43.5 kg/m.       Assessment & Plan:   Problem List Items Addressed This Visit  Cardiovascular and Mediastinum   Essential hypertension, benign     Digestive   Gastroesophageal reflux disease without esophagitis     Endocrine   Diabetes mellitus without complication ([place]) - Primary   Relevant Medications   Semaglutide (RYBELSUS) 3 MG TABS   Hyperlipidemia associated with type 2 diabetes mellitus (HCC)   Relevant Medications   Semaglutide (RYBELSUS) 3 MG TABS     Other   Morbid obesity (HCC)   Relevant Medications   Semaglutide (RYBELSUS) 3 MG TABS   Anxiety as acute reaction to exceptional stress    Other Visit Diagnoses    Fatigue, unspecified type       Relevant Orders   TSH      Meds ordered this encounter  Medications  . Semaglutide (RYBELSUS) 3 MG TABS    Sig: Take 3 mg by mouth daily. For diabetes    Dispense:  30 tablet    Refill:  0    Order  Specific Question:   Supervising Provider    Answer:   Sallee Lange A [[id number]]  . terconazole (TERAZOL 7) 0.4 % vaginal cream    Sig: Place 1 applicator vaginally at bedtime. For 7 nights    Dispense:  45 g    Refill:  0    Order Specific Question:   Supervising Provider    Answer:   Sallee Lange A [[id number]]   TSH pending.  Patient's A1C is 9.4, which is higher than prior A1C of 8.4. Stop Wilder Glade, Ordered Rybelsus 3 mg for 30 days, if medication is tolerated after one month, plan to increase to 7 mg.   Increase Lantus by 2 units every 3 days until sugar is between 130-150. If up to 40 units and not at goal, please notify provider. Continue to monitor BS using her CBG.  Discussed blood pressure management. Continue checking blood pressure daily.   Discussed options to assist with weight loss. Recommended trying mediterranean diet to assist with weight  Information handout added to check out sheet.   Patient is seeing the chiropractor for hip pain. Encouraged patient to continue working on that as well as increasing physical activity as tolerated.   Return in about 3 months (around 03/11/2021) for for diabetes check up . Call back sooner if any problems.

## 2020-12-10 NOTE — Progress Notes (Deleted)
Acute Office Visit  Subjective:    Patient ID: Susan Becker, female    DOB: 1960-10-30, 60 y.o.   MRN: [id number]  Chief Complaint  Patient presents with  . Diabetes    Follow up    HPI Patient is in today for diabetes check up. Recently started on farxiga. Patient does have increase urinary frequency and reoccurring yeast infection.    Past Medical History:  Diagnosis Date  . Diabetes mellitus without complication ([place])   . GERD (gastroesophageal reflux disease)   . HTN (hypertension) 09/2010  . Impaired fasting glucose 04/2006  . PCOS (polycystic ovarian syndrome)    per gyn  . Sleep apnea     Past Surgical History:  Procedure Laterality Date  . COLONOSCOPY N/A 12/16/2014   Procedure: COLONOSCOPY;  Surgeon: Rogene Houston, MD;  [facility];  Service: Endoscopy;  Laterality: N/A;  830  . COLONOSCOPY WITH PROPOFOL N/A 08/18/2020   Procedure: COLONOSCOPY WITH PROPOFOL;  Surgeon: Rogene Houston, MD;  [facility];  Service: Endoscopy;  Laterality: N/A;  930  . POLYPECTOMY  08/18/2020   Procedure: POLYPECTOMY INTESTINAL;  Surgeon: Rogene Houston, MD;  [facility];  Service: Endoscopy;;    Family History  Problem Relation Age of Onset  . Hypertension Mother   . Diabetes Mother   . Hypertension Father   . Hyperlipidemia Father   . Stroke Father   . Cancer Paternal Grandmother 74       colon  . Stroke Paternal Grandfather   . Hypertension Maternal Grandmother   . Stroke Maternal Grandmother   . Hypertension Maternal Grandfather   . Stroke Maternal Grandfather     Social History   Socioeconomic History  . Marital status: Married    Spouse name: Not on file  . Number of children: Not on file  . Years of education: Not on file  . Highest education level: Not on file  Occupational History  . Not on file  Tobacco Use  . Smoking status: Never Smoker  . Smokeless tobacco: Never Used  Vaping Use  . Vaping Use: Never used   Substance and Sexual Activity  . Alcohol use: Never    Alcohol/week: 0.0 standard drinks  . Drug use: Never  . Sexual activity: Yes    Birth control/protection: Post-menopausal  Other Topics Concern  . Not on file  Social History Narrative  . Not on file   Social Determinants of Health   Financial Resource Strain: Not on file  Food Insecurity: Not on file  Transportation Needs: Not on file  Physical Activity: Not on file  Stress: Not on file  Social Connections: Not on file  Intimate Partner Violence: Not on file    Outpatient Medications Prior to Visit  Medication Sig Dispense Refill  . ascorbic acid (VITAMIN C) 500 MG tablet Take 500 mg by mouth daily.    . blood glucose meter kit and supplies KIT Dispense based on patient and insurance preference. Use to check blood sugar BID. ICD 10 code E11.9 1 each 0  . cholecalciferol (VITAMIN D3) 25 MCG (1000 UNIT) tablet Take 1,000 Units by mouth daily.    . Continuous Blood Gluc Sensor (FREESTYLE LIBRE 14 DAY SENSOR) MISC Apply to skin as directed. Remove and replace every 14 days. 2 each 11  . dapagliflozin propanediol (FARXIGA) 5 MG TABS tablet Take 1 tablet (5 mg total) by mouth daily before breakfast. 90 tablet 1  . escitalopram (  LEXAPRO) 20 MG tablet TAKE 1 TABLET(20 MG) BY MOUTH DAILY 90 tablet 1  . glucose blood (CONTOUR NEXT TEST) test strip Use as instructed once daily 50 each 11  . insulin glargine (LANTUS SOLOSTAR) 100 UNIT/ML Solostar Pen Inject 24 Units into the skin daily. 15 mL 0  . Insulin Pen Needle (PEN NEEDLES) 31G X 5 MM MISC Use daily with insulin pen as directed 100 each 0  . losartan (COZAAR) 100 MG tablet TAKE 1 TABLET(100 MG) BY MOUTH DAILY 90 tablet 0  . metFORMIN (GLUCOPHAGE) 1000 MG tablet TAKE 1 TABLET BY MOUTH TWICE DAILY WITH A MEAL 60 tablet 2  . pantoprazole (PROTONIX) 40 MG tablet TAKE 1 TABLET(40 MG) BY MOUTH DAILY FOR ACID REFLUX 90 tablet 0  . rosuvastatin (CRESTOR) 5 MG tablet TAKE 1 TABLET(5 MG)  BY MOUTH DAILY FOR CHOLESTEROL 90 tablet 0  . zinc gluconate 50 MG tablet Take 50 mg by mouth daily.    . fluconazole (DIFLUCAN) 150 MG tablet One po qd prn yeast infection; may repeat in 3-4 days if needed 2 tablet 0  . phentermine (ADIPEX-P) 37.5 MG tablet Take 1 tablet (37.5 mg total) by mouth daily before breakfast. 30 tablet 2   No facility-administered medications prior to visit.    Allergies  Allergen Reactions  . Zithromax [Azithromycin]     Doesn't work  . Trulicity [Dulaglutide] Nausea And Vomiting    Severe nausea with some vomiting and abdominal upset    Review of Systems  Constitutional: Negative for activity change, appetite change, chills, fatigue and fever.  Respiratory: Positive for cough. Negative for choking, chest tightness, shortness of breath and wheezing.        Cough after chewing, pt has GERD, not on ACEi    Cardiovascular: Negative for chest pain, palpitations and leg swelling.  Endocrine: Positive for polydipsia, polyphagia and polyuria.       Objective:    Physical Exam  BP 128/82   Pulse 87   Temp (!) 97.2 F (36.2 C) (Oral)   Ht 5' 4.5" (1.638 m)   Wt 257 lb 6.4 oz (116.8 kg)   LMP 06/21/2013   SpO2 97%   BMI 43.50 kg/m  Wt Readings from Last 3 Encounters:  12/10/20 257 lb 6.4 oz (116.8 kg)  09/15/20 256 lb (116.1 kg)  08/17/20 255 lb (115.7 kg)    Health Maintenance Due  Topic Date Due  . COVID-19 Vaccine (2 - Booster for [employer] series) 01/21/2020  . OPHTHALMOLOGY EXAM  07/10/2020    There are no preventive care reminders to display for this patient.   Lab Results  Component Value Date   TSH 2.570 07/05/2018   Lab Results  Component Value Date   WBC 12.2 (H) 03/28/2019   HGB 12.1 03/28/2019   HCT 37.4 03/28/2019   MCV 91.4 03/28/2019   PLT 235 03/28/2019   Lab Results  Component Value Date   NA 141 12/02/2020   K 4.3 12/02/2020   CO2 21 12/02/2020   GLUCOSE 239 (H) 12/02/2020   BUN 14 12/02/2020   CREATININE 0.72  12/02/2020   BILITOT 0.3 12/02/2020   ALKPHOS 82 12/02/2020   AST 10 12/02/2020   ALT 13 12/02/2020   PROT 7.3 12/02/2020   ALBUMIN 4.4 12/02/2020   CALCIUM 9.3 12/02/2020   ANIONGAP 10 08/17/2020   Lab Results  Component Value Date   CHOL 190 12/02/2020   Lab Results  Component Value Date   HDL 42 12/02/2020  Lab Results  Component Value Date   LDLCALC 107 (H) 12/02/2020   Lab Results  Component Value Date   TRIG 241 (H) 12/02/2020   Lab Results  Component Value Date   CHOLHDL 4.5 (H) 12/02/2020   Lab Results  Component Value Date   HGBA1C 9.4 (H) 12/02/2020       Assessment & Plan:   Problem List Items Addressed This Visit   None      No orders of the defined types were placed in this encounter.    Shelah Lewandowsky, RN

## 2020-12-11 ENCOUNTER — Encounter: Payer: Self-pay | Admitting: Nurse Practitioner

## 2020-12-11 LAB — TSH: TSH: 3.39 u[IU]/mL (ref 0.450–4.500)

## 2020-12-21 ENCOUNTER — Encounter: Payer: Self-pay | Admitting: Nurse Practitioner

## 2020-12-22 ENCOUNTER — Other Ambulatory Visit: Payer: Self-pay | Admitting: *Deleted

## 2020-12-22 DIAGNOSIS — E1165 Type 2 diabetes mellitus with hyperglycemia: Secondary | ICD-10-CM

## 2020-12-22 NOTE — Telephone Encounter (Signed)
Hi Verneda Skill is out until the end of the week.  Dr. Debbora Presto is a good endocrinologist in [facility] with [facility].  We have had several of our patients through the years cared for by her.  They should also be able to offer the ability for you to meet with a nutritionist.  They can help work with you regarding your medications.  Remember to continue to fit in activity on a daily basis as well.  We will send referral to their office-their office should connect with you to set up an appointment-it could take a few weeks to get in with them  Let us know if any issues  Thanks-Dr. Nicki Reaper  Nurses please go ahead with referral as stated above for diabetes poor control

## 2020-12-24 ENCOUNTER — Encounter: Payer: Self-pay | Admitting: Nurse Practitioner

## 2020-12-24 ENCOUNTER — Other Ambulatory Visit: Payer: Self-pay | Admitting: *Deleted

## 2020-12-24 MED ORDER — LANTUS SOLOSTAR 100 UNIT/ML ~~LOC~~ SOPN: PEN_INJECTOR | SUBCUTANEOUS | 1 refills | Status: DC

## 2020-12-24 NOTE — Telephone Encounter (Signed)
Lantus on med list is 24 units. Pt states Hoyle Sauer told her  to go up 3 every day until 40 units and she is now at 40 units. Sugars are in the mornings around 190 -195 and during day around 150 -160. [facility] on scales. Has enough through weekend but will need refill by [facility]

## 2020-12-24 NOTE — Telephone Encounter (Signed)
Nurses May have Lantus prescription 40 units daily, may titrate up to 60 units if necessary, 1 month supply with 1 refill Hopefully eventually patient will get in with endocrinology and they can help manage from that point but for now we will be glad to assist She should send [place] updates on a fairly regular basis May go up to 3 units every 2 days until fasting sugars are in the 100- 130 range Avoid low sugars during the day If any problems let us know

## 2021-01-08 ENCOUNTER — Other Ambulatory Visit: Payer: Self-pay | Admitting: Nurse Practitioner

## 2021-01-12 ENCOUNTER — Other Ambulatory Visit: Payer: Self-pay | Admitting: Nurse Practitioner

## 2021-01-12 MED ORDER — RYBELSUS 7 MG PO TABS
7.0000 mg | ORAL_TABLET | Freq: Every day | ORAL | 2 refills | Status: DC
Start: 2021-01-12 — End: 2021-07-29

## 2021-01-17 ENCOUNTER — Other Ambulatory Visit: Payer: Self-pay | Admitting: Family Medicine

## 2021-01-27 ENCOUNTER — Other Ambulatory Visit: Payer: Self-pay

## 2021-01-27 MED ORDER — PANTOPRAZOLE SODIUM 40 MG PO TBEC: DELAYED_RELEASE_TABLET | ORAL | 0 refills | Status: DC

## 2021-02-01 ENCOUNTER — Ambulatory Visit (HOSPITAL_COMMUNITY): Payer: PRIVATE HEALTH INSURANCE | Admitting: Physical Therapy

## 2021-02-01 ENCOUNTER — Other Ambulatory Visit: Payer: Self-pay

## 2021-02-01 MED ORDER — ROSUVASTATIN CALCIUM 5 MG PO TABS: ORAL_TABLET | ORAL | 0 refills | Status: DC

## 2021-02-07 LAB — HEMOGLOBIN A1C: Hemoglobin A1C: 7.2

## 2021-02-08 ENCOUNTER — Other Ambulatory Visit: Payer: Self-pay

## 2021-02-08 ENCOUNTER — Encounter (HOSPITAL_COMMUNITY): Payer: Self-pay | Admitting: Physical Therapy

## 2021-02-08 ENCOUNTER — Ambulatory Visit (HOSPITAL_COMMUNITY): Payer: No Typology Code available for payment source | Attending: Chiropractic Medicine | Admitting: Physical Therapy

## 2021-02-08 DIAGNOSIS — M545 Low back pain, unspecified: Secondary | ICD-10-CM | POA: Diagnosis present

## 2021-02-08 DIAGNOSIS — M6281 Muscle weakness (generalized): Secondary | ICD-10-CM | POA: Diagnosis present

## 2021-02-08 NOTE — Therapy (Signed)
[place], [place], [address] Phone: [phone]   Fax:  [phone]  Physical Therapy Evaluation  Patient Details  Name: Susan Becker MRN: [id number] Date of Birth: [birth year] Referring Provider (PT): Levy Pupa PA   Encounter Date: 02/08/2021   PT End of Session - 02/08/21 0751     Visit Number 1    Number of Visits 12    Date for PT Re-Evaluation 03/22/21    Authorization Type medcost - VL 23 wit no PT apts used    Authorization - Visit Number 1    Authorization - Number of Visits 60    Progress Note Due on Visit 10    PT Start Time 0751   late to session   PT Stop Time 0822    PT Time Calculation (min) 31 min    Activity Tolerance Patient tolerated treatment well    Behavior During Therapy [facility] for tasks assessed/performed             Past Medical History:  Diagnosis Date   Diabetes mellitus without complication ([place])    GERD (gastroesophageal reflux disease)    HTN (hypertension) 09/2010   Impaired fasting glucose 04/2006   PCOS (polycystic ovarian syndrome)    per gyn   Sleep apnea     Past Surgical History:  Procedure Laterality Date   COLONOSCOPY N/A 12/16/2014   Procedure: COLONOSCOPY;  Surgeon: Rogene Houston, MD;  [facility];  Service: Endoscopy;  Laterality: N/A;  830   COLONOSCOPY WITH PROPOFOL N/A 08/18/2020   Procedure: COLONOSCOPY WITH PROPOFOL;  Surgeon: Rogene Houston, MD;  [facility];  Service: Endoscopy;  Laterality: N/A;  930   POLYPECTOMY  08/18/2020   Procedure: POLYPECTOMY INTESTINAL;  Surgeon: Rogene Houston, MD;  [facility];  Service: Endoscopy;;    There were no vitals filed for this visit.    Subjective Assessment - 02/08/21 8341     Subjective States that she has had back pain and it started on her left bit now it is on the right. States that it now goes down her leg to her shin on the right. Occasionally she still  feels some numbness on the left. States she went to a chiropractor and that helped some with her pain so she sought an orthopedic MD -put her on ant-inflammatory and that helped with pain. She has a Network engineer job. Sitting makes her pain worse but up walking makes her feel better. States that if she drives a long period of time she starts hurting under her leg. Sweeping also aggravates her back (bent over positions). Reports her pain started about 8 months ago.    Pertinent History DB    Limitations Sitting;House hold activities    Patient Stated Goals to have less pain    Currently in Pain? Yes    Pain Score 1     Pain Location Back    Pain Orientation Left;Right;Lower    Pain Descriptors / Indicators Aching;Tightness    Pain Type Chronic pain    Pain Radiating Towards down both legs with current in right leg    Pain Onset More than a month ago    Pain Frequency Intermittent    Aggravating Factors  sitting    Pain Relieving Factors movement                [facility] PT Assessment - 02/08/21 0001  Assessment   Medical Diagnosis LBP    Referring Provider (PT) Levy Pupa PA    Next MD Visit 3 months    Prior Therapy non      Balance Screen   Has the patient fallen in the past 6 months No      Prior Function   Level of Independence Independent      Observation/Other Assessments   Focus on Therapeutic Outcomes (FOTO)  63% function      ROM / Strength   AROM / PROM / Strength AROM;Strength      AROM   Overall AROM Comments hip ROM limited in bilateral hip extension L >R and pain wiht left hip flexion in hip/back    AROM Assessment Site Lumbar    Lumbar Flexion 50% limited   felt good on back no symptoms in leg   Lumbar Extension 50% limited   hitches at L5/S1 - felt good, no symptoms in the leg   Lumbar - Right Side Bend 50% limited   no symptoms in the leg, stretch   Lumbar - Left Side Bend 75% limited   no symptoms in the leg, stretch     Strength   Strength Assessment  Site Hip;Knee;Ankle    Right/Left Hip Right;Left    Right Hip Flexion 3/5    Left Hip Flexion 3/5   painful in left leg   Right/Left Knee Right;Left    Right Knee Flexion 4-/5    Right Knee Extension 4/5    Left Knee Flexion 3+/5    Left Knee Extension 4/5    Right/Left Ankle Left;Right    Right Ankle Dorsiflexion 4+/5    Left Ankle Dorsiflexion 4+/5      Palpation   Spinal mobility hypomobility in lumbar spine    Palpation comment tenderness lumbar paraspinals bilaterally  and right glutes .                        Objective measurements completed on examination: See above findings.       [facility] Adult PT Treatment/Exercise - 02/08/21 0001       Exercises   Exercises Lumbar      Lumbar Exercises: Supine   Bridge 15 reps;2 seconds                    PT Education - 02/08/21 0812     Education Details on anatomy, POC, FOTO score, lumbar support with feet on ground for support, HEP    Person(s) Educated Patient    Methods Explanation    Comprehension Verbalized understanding              PT Short Term Goals - 02/08/21 5176       PT SHORT TERM GOAL #1   Title Patient will report at least 25% improvement in overall symptoms and/or function to demonstrate improved functional mobility    Time 3    Period Weeks    Status New    Target Date 03/01/21      PT SHORT TERM GOAL #2   Title Patient will be independent in self management strategies to improve quality of life and functional outcomes.    Time 3    Period Weeks    Status New    Target Date 03/01/21      PT SHORT TERM GOAL #3   Title Patient will demonstrate good ergonomic set up at work and report using it 100% of time  Time 3    Period Weeks    Status New    Target Date 03/01/21               PT Long Term Goals - 02/08/21 0823       PT LONG TERM GOAL #1   Title Patient will report no radicular symptoms to improve QOL    Time 6    Period Weeks    Status New     Target Date 03/22/21      PT LONG TERM GOAL #2   Title Patient will improve on FOTO score to meet predicted outcomes to demonstrate improved functional mobility.    Time 6    Period Weeks    Status New    Target Date 03/22/21      PT LONG TERM GOAL #3   Title Patient will report at least 50% improvement in overall symptoms and/or function to demonstrate improved functional mobility    Time 6    Period Weeks    Status New    Target Date 03/22/21                    Plan - 02/08/21 0825     Clinical Impression Statement Patient is a 60 y.o. female who presents to physical therapy with complaint of chronic low back pain that did not get better with chiropractic treatment. 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 Patient demonstrates decreased strength, ROM restriction  and posture which are likely contributing to symptoms of pain and are negatively impacting patient ability to perform ADLs and functional mobility tasks. Patient will benefit from skilled physical therapy services to address these deficits to reduce pain, improve level of function with ADLs, functional mobility tasks, and reduce risk for falls.    Personal Factors and Comorbidities Comorbidity 1;Fitness;Profession    Comorbidities DB    Examination-Activity Limitations Sit;Bend    Examination-Participation Restrictions Cleaning;Driving;Occupation    Stability/Clinical Decision Making Stable/Uncomplicated    Clinical Decision Making Low    Rehab Potential Good    PT Frequency 2x / week    PT Duration 6 weeks    PT Treatment/Interventions ADLs/Self Care Home Management;Aquatic Therapy;Cryotherapy;Electrical Stimulation;Moist Heat;Traction;Balance training;Therapeutic exercise;Therapeutic activities;Neuromuscular re-education;Patient/family education;Manual techniques;Dry needling;Joint Manipulations;Spinal Manipulations    PT Next Visit Plan trial ball/ball chair for work  set up, core and LE strength, lumbar mobility, trial traction    PT Home Exercise Plan bridges, lumbar support and feet support at work    [employer] and Agree with Plan of Care Patient             Patient will benefit from skilled therapeutic intervention in order to improve the following deficits and impairments:  Pain, Decreased strength, Decreased range of motion, Postural dysfunction, Improper body mechanics  Visit Diagnosis: Lumbar pain  Muscle weakness (generalized)     Problem List Patient Active Problem List   Diagnosis Date Noted   Hyperlipidemia associated with type 2 diabetes mellitus ([place]) 09/15/2020   Gastroesophageal reflux disease without esophagitis 07/09/2020   Obstructive sleep apnea treated with continuous positive airway pressure (CPAP) 05/07/2017   Diabetes mellitus without complication ([place]) 41/74/0814   Hyperlipidemia LDL goal <100 01/27/2016   Post-menopausal bleeding 01/27/2016   Anxiety as acute reaction to exceptional stress 11/07/2014   Vitamin D deficiency 10/13/2014   Morbid obesity ([place]) 06/03/2014   Type 2 diabetes mellitus ([place]) 12/05/2013   Essential hypertension, benign 07/31/2013   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

## 2021-02-10 ENCOUNTER — Encounter: Payer: Self-pay | Admitting: Family Medicine

## 2021-02-15 ENCOUNTER — Other Ambulatory Visit: Payer: Self-pay

## 2021-02-15 ENCOUNTER — Ambulatory Visit (HOSPITAL_COMMUNITY): Payer: No Typology Code available for payment source

## 2021-02-15 ENCOUNTER — Encounter (HOSPITAL_COMMUNITY): Payer: Self-pay

## 2021-02-15 DIAGNOSIS — M545 Low back pain, unspecified: Secondary | ICD-10-CM

## 2021-02-15 DIAGNOSIS — M6281 Muscle weakness (generalized): Secondary | ICD-10-CM

## 2021-02-15 NOTE — Therapy (Signed)
[place], [place], [address] Phone: [phone]   Fax:  [phone]  Physical Therapy Treatment  Patient Details  Name: Susan Becker MRN: [id number] Date of Birth: [birth year] Referring Provider (PT): Levy Pupa PA   Encounter Date: 02/15/2021   PT End of Session - 02/15/21 1645     Visit Number 2    Number of Visits 12    Date for PT Re-Evaluation 03/22/21    Authorization Type medcost - VL 60 wit no PT apts used    Authorization - Visit Number 2    Authorization - Number of Visits 60    Progress Note Due on Visit 10    PT Start Time 1612    PT Stop Time 2751    PT Time Calculation (min) 41 min    Activity Tolerance Patient tolerated treatment well;No increased pain    Behavior During Therapy [facility] for tasks assessed/performed             Past Medical History:  Diagnosis Date   Diabetes mellitus without complication (HCC)    GERD (gastroesophageal reflux disease)    HTN (hypertension) 09/2010   Impaired fasting glucose 04/2006   PCOS (polycystic ovarian syndrome)    per gyn   Sleep apnea     Past Surgical History:  Procedure Laterality Date   COLONOSCOPY N/A 12/16/2014   Procedure: COLONOSCOPY;  Surgeon: Rogene Houston, MD;  [facility];  Service: Endoscopy;  Laterality: N/A;  830   COLONOSCOPY WITH PROPOFOL N/A 08/18/2020   Procedure: COLONOSCOPY WITH PROPOFOL;  Surgeon: Rogene [place], MD;  [facility];  Service: Endoscopy;  Laterality: N/A;  930   POLYPECTOMY  08/18/2020   Procedure: POLYPECTOMY INTESTINAL;  Surgeon: Rogene [place], MD;  [facility];  Service: Endoscopy;;    There were no vitals filed for this visit.   Subjective Assessment - 02/15/21 1615     Subjective Feeling good today, no reports of pain currently.  Reports compliance wiht HEP daily without questions.  REports pain with prolonged standing and sitting.    Pertinent History  DB    How long can you sit comfortably? 02/15/21: couple of hours before increased stiffness    How long can you stand comfortably? 02/15/21: Reoprts of increased pain following 30 minutes    Patient Stated Goals to have less pain    Currently in Pain? No/denies                [facility] PT Assessment - 02/15/21 0001       Assessment   Medical Diagnosis LBP    Referring Provider (PT) Levy Pupa PA    Next MD Visit 3 months; call to schedule following PT    Prior Therapy non                           [facility] Adult PT Treatment/Exercise - 02/15/21 0001       Exercises   Exercises Lumbar      Lumbar Exercises: Stretches   Single Knee to Chest Stretch 2 reps;20 seconds    Lower Trunk Rotation 5 reps;10 seconds      Lumbar Exercises: Seated   Long Arc Quad on [place] Both;10 reps    Hip Flexion on [place] 15 reps    Other Seated Lumbar Exercises rows with GTB sitting on theraball    Other Seated Lumbar  Exercises abdominal sets 3" holds counting outoud x 2 min sitting on theraball      Lumbar Exercises: Supine   Clam 10 reps;5 seconds    Clam Limitations with core stability with GTB    Bent Knee Raise 15 reps;3 seconds    Bent Knee Raise Limitations with ab set    Bridge 15 reps;2 seconds                      PT Short Term Goals - 02/08/21 1157       PT SHORT TERM GOAL #1   Title Patient will report at least 25% improvement in overall symptoms and/or function to demonstrate improved functional mobility    Time 3    Period Weeks    Status New    Target Date 03/01/21      PT SHORT TERM GOAL #2   Title Patient will be independent in self management strategies to improve quality of life and functional outcomes.    Time 3    Period Weeks    Status New    Target Date 03/01/21      PT SHORT TERM GOAL #3   Title Patient will demonstrate good ergonomic set up at work and report using it 100% of time    Time 3    Period Weeks    Status New     Target Date 03/01/21               PT Long Term Goals - 02/08/21 0823       PT LONG TERM GOAL #1   Title Patient will report no radicular symptoms to improve QOL    Time 6    Period Weeks    Status New    Target Date 03/22/21      PT LONG TERM GOAL #2   Title Patient will improve on FOTO score to meet predicted outcomes to demonstrate improved functional mobility.    Time 6    Period Weeks    Status New    Target Date 03/22/21      PT LONG TERM GOAL #3   Title Patient will report at least 50% improvement in overall symptoms and/or function to demonstrate improved functional mobility    Time 6    Period Weeks    Status New    Target Date 03/22/21                   Plan - 02/15/21 1625     Clinical Impression Statement Reviewed goals, educated importance of HEP compliance for maximal benefits, pt reports compliance wiht HEP daily.  Pt educated on importance of good posture in sitting as works for 7.5hr days sitting primarly.  Pt shown lumbar chair support as well as ball chair.  Pt interested in ball chair, abdominal and postural strengtheing exercises complete this session with good tolerance, no reports of pain just fatigue with activity.    Personal Factors and Comorbidities Comorbidity 1;Fitness;Profession    Comorbidities DB    Examination-Activity Limitations Sit;Bend    Examination-Participation Restrictions Cleaning;Driving;Occupation    Stability/Clinical Decision Making Stable/Uncomplicated    Clinical Decision Making Low    Rehab Potential Good    PT Frequency 2x / week    PT Duration 6 weeks    PT Treatment/Interventions ADLs/Self Care Home Management;Aquatic Therapy;Cryotherapy;Electrical Stimulation;Moist Heat;Traction;Balance training;Therapeutic exercise;Therapeutic activities;Neuromuscular re-education;Patient/family education;Manual techniques;Dry needling;Joint Manipulations;Spinal Manipulations    PT Next Visit Plan trial ball/ball chair for  work set up, core and LE strength, lumbar mobility, trial traction    PT Home Exercise Plan bridges, lumbar support and feet support at work    [employer] and Agree with Plan of Care Patient             Patient will benefit from skilled therapeutic intervention in order to improve the following deficits and impairments:  Pain, Decreased strength, Decreased range of motion, Postural dysfunction, Improper body mechanics  Visit Diagnosis: Muscle weakness (generalized)  Lumbar pain     Problem List Patient Active Problem List   Diagnosis Date Noted   Hyperlipidemia associated with type 2 diabetes mellitus ([place]) 09/15/2020   Gastroesophageal reflux disease without esophagitis 07/09/2020   Obstructive sleep apnea treated with continuous positive airway pressure (CPAP) 05/07/2017   Diabetes mellitus without complication ([place]) 74/14/2395   Hyperlipidemia LDL goal <100 01/27/2016   Post-menopausal bleeding 01/27/2016   Anxiety as acute reaction to exceptional stress 11/07/2014   Vitamin D deficiency 10/13/2014   Morbid obesity ([place]) 06/03/2014   Type 2 diabetes mellitus ([place]) 12/05/2013   Essential hypertension, benign 07/31/2013   Neuropathy of hand 07/31/2013   Ihor Austin, LPTA/CLT; CBIS [phone]  Aldona Lento 02/15/2021, 4:59 PM  [facility] [address] [place], [place], [address] Phone: [phone]   Fax:  [phone]  Name: Susan Becker MRN: [id number] Date of Birth: 01/15/1961

## 2021-02-18 ENCOUNTER — Other Ambulatory Visit: Payer: Self-pay | Admitting: Family Medicine

## 2021-02-24 ENCOUNTER — Other Ambulatory Visit: Payer: Self-pay

## 2021-02-24 ENCOUNTER — Ambulatory Visit (HOSPITAL_COMMUNITY): Payer: No Typology Code available for payment source | Attending: Chiropractic Medicine | Admitting: Physical Therapy

## 2021-02-24 DIAGNOSIS — M545 Low back pain, unspecified: Secondary | ICD-10-CM | POA: Insufficient documentation

## 2021-02-24 DIAGNOSIS — M6281 Muscle weakness (generalized): Secondary | ICD-10-CM | POA: Insufficient documentation

## 2021-02-24 NOTE — Therapy (Signed)
[place], [place], [address] Phone: [phone]   Fax:  [phone]  Physical Therapy Treatment  Patient Details  Name: Susan Becker MRN: [id number] Date of Birth: 03-15-1961 Referring Provider (PT): Levy Pupa PA   Encounter Date: 02/24/2021   PT End of Session - 02/24/21 1710     Visit Number 3    Number of Visits 12    Date for PT Re-Evaluation 03/22/21    Authorization Type medcost - VL 60 wit no PT apts used    Authorization - Visit Number 3    Authorization - Number of Visits 60    Progress Note Due on Visit 10    PT Start Time 1618    PT Stop Time 1700    PT Time Calculation (min) 42 min    Activity Tolerance Patient tolerated treatment well;No increased pain    Behavior During Therapy [facility] for tasks assessed/performed             Past Medical History:  Diagnosis Date   Diabetes mellitus without complication (HCC)    GERD (gastroesophageal reflux disease)    HTN (hypertension) 09/2010   Impaired fasting glucose 04/2006   PCOS (polycystic ovarian syndrome)    per gyn   Sleep apnea     Past Surgical History:  Procedure Laterality Date   COLONOSCOPY N/A 12/16/2014   Procedure: COLONOSCOPY;  Surgeon: Rogene Houston, MD;  [facility];  Service: Endoscopy;  Laterality: N/A;  830   COLONOSCOPY WITH PROPOFOL N/A 08/18/2020   Procedure: COLONOSCOPY WITH PROPOFOL;  Surgeon: Rogene Houston, MD;  [facility];  Service: Endoscopy;  Laterality: N/A;  930   POLYPECTOMY  08/18/2020   Procedure: POLYPECTOMY INTESTINAL;  Surgeon: Rogene Houston, MD;  [facility];  Service: Endoscopy;;    There were no vitals filed for this visit.                      [facility] Adult PT Treatment/Exercise - 02/24/21 0001       Lumbar Exercises: Stretches   Single Knee to Chest Stretch 2 reps;20 seconds    Lower Trunk Rotation 5 reps;10 seconds      Lumbar  Exercises: Seated   Long Arc Quad on [place] Both;20 reps    Hip Flexion on [place] 20 reps    Other Seated Lumbar Exercises rows with GTB sitting on theraball      Lumbar Exercises: Supine   Clam 20 reps    Clam Limitations with core stability with GTB    Bent Knee Raise 15 reps;3 seconds    Bent Knee Raise Limitations with ab set    Bridge 20 reps;2 seconds      Lumbar Exercises: Sidelying   Hip Abduction Both;Limitations;20 reps   2 sets of 10     Lumbar Exercises: Prone   Straight Leg Raise 20 reps;Limitations    Straight Leg Raises Limitations 2 sets of 10 each LE                      PT Short Term Goals - 02/08/21 5170       PT SHORT TERM GOAL #1   Title Patient will report at least 25% improvement in overall symptoms and/or function to demonstrate improved functional mobility    Time 3    Period Weeks    Status New    Target  Date 03/01/21      PT SHORT TERM GOAL #2   Title Patient will be independent in self management strategies to improve quality of life and functional outcomes.    Time 3    Period Weeks    Status New    Target Date 03/01/21      PT SHORT TERM GOAL #3   Title Patient will demonstrate good ergonomic set up at work and report using it 100% of time    Time 3    Period Weeks    Status New    Target Date 03/01/21               PT Long Term Goals - 02/08/21 0823       PT LONG TERM GOAL #1   Title Patient will report no radicular symptoms to improve QOL    Time 6    Period Weeks    Status New    Target Date 03/22/21      PT LONG TERM GOAL #2   Title Patient will improve on FOTO score to meet predicted outcomes to demonstrate improved functional mobility.    Time 6    Period Weeks    Status New    Target Date 03/22/21      PT LONG TERM GOAL #3   Title Patient will report at least 50% improvement in overall symptoms and/or function to demonstrate improved functional mobility    Time 6    Period Weeks    Status New     Target Date 03/22/21                   Plan - 02/24/21 1712     Clinical Impression Statement Continued with established core stab actvities with addition of hip strenghtening this session.  Pt with cues to complete therex more slowly/controlled while stabilizing core (esp in supine).  Added sidelying hip abduction with noted fatigue/weakness as well as prone hip extension.  Pt given loop for her theraband with instrcutions to place in door hinge to complete seated rows on her ball.  Pt states the ball exercises are going well.    Personal Factors and Comorbidities Comorbidity 1;Fitness;Profession    Comorbidities DB    Examination-Activity Limitations Sit;Bend    Examination-Participation Restrictions Cleaning;Driving;Occupation    Stability/Clinical Decision Making Stable/Uncomplicated    Rehab Potential Good    PT Frequency 2x / week    PT Duration 6 weeks    PT Treatment/Interventions ADLs/Self Care Home Management;Aquatic Therapy;Cryotherapy;Electrical Stimulation;Moist Heat;Traction;Balance training;Therapeutic exercise;Therapeutic activities;Neuromuscular re-education;Patient/family education;Manual techniques;Dry needling;Joint Manipulations;Spinal Manipulations    PT Next Visit Plan continue with core stab activities as well as LE strength and lumbar mobility.    PT Home Exercise Plan bridges, lumbar support and feet support at work    [employer] and Agree with Plan of Care Patient             Patient will benefit from skilled therapeutic intervention in order to improve the following deficits and impairments:  Pain, Decreased strength, Decreased range of motion, Postural dysfunction, Improper body mechanics  Visit Diagnosis: Muscle weakness (generalized)  Lumbar pain     Problem List Patient Active Problem List   Diagnosis Date Noted   Hyperlipidemia associated with type 2 diabetes mellitus ([place]) 09/15/2020   Gastroesophageal reflux disease without  esophagitis 07/09/2020   Obstructive sleep apnea treated with continuous positive airway pressure (CPAP) 05/07/2017   Diabetes mellitus without complication ([place]) 19/37/9024   Hyperlipidemia LDL  goal <100 01/27/2016   Post-menopausal bleeding 01/27/2016   Anxiety as acute reaction to exceptional stress 11/07/2014   Vitamin D deficiency 10/13/2014   Morbid obesity ([place]) 06/03/2014   Type 2 diabetes mellitus ([place]) 12/05/2013   Essential hypertension, benign 07/31/2013   Neuropathy of hand 07/31/2013   Teena Irani, PTA/CLT [phone]  Teena Irani 02/24/2021, 5:15 PM  [facility] [address] [place], [place], [address] Phone: [phone]   Fax:  [phone]  Name: Susan Becker MRN: [id number] Date of Birth: May 26, 1961

## 2021-02-28 ENCOUNTER — Other Ambulatory Visit: Payer: Self-pay

## 2021-02-28 ENCOUNTER — Encounter (HOSPITAL_COMMUNITY): Payer: Self-pay | Admitting: Physical Therapy

## 2021-02-28 ENCOUNTER — Ambulatory Visit (HOSPITAL_COMMUNITY): Payer: No Typology Code available for payment source | Admitting: Physical Therapy

## 2021-02-28 DIAGNOSIS — M6281 Muscle weakness (generalized): Secondary | ICD-10-CM

## 2021-02-28 DIAGNOSIS — M545 Low back pain, unspecified: Secondary | ICD-10-CM

## 2021-02-28 NOTE — Therapy (Signed)
[place], [place], [address] Phone: [phone]   Fax:  [phone]  Physical Therapy Treatment  Patient Details  Name: STEPHAN DRAUGHN MRN: [id number] Date of Birth: 07/09/1961 Referring Provider (PT): Levy Pupa PA   Encounter Date: 02/28/2021   PT End of Session - 02/28/21 1450     Visit Number 4    Number of Visits 12    Date for PT Re-Evaluation 03/22/21    Authorization Type medcost - VL 60 wit no PT apts used    Authorization - Visit Number 4    Authorization - Number of Visits 60    Progress Note Due on Visit 10    PT Start Time 6962    PT Stop Time 1527    PT Time Calculation (min) 38 min    Activity Tolerance Patient tolerated treatment well;No increased pain    Behavior During Therapy [facility] for tasks assessed/performed             Past Medical History:  Diagnosis Date   Diabetes mellitus without complication (HCC)    GERD (gastroesophageal reflux disease)    HTN (hypertension) 09/2010   Impaired fasting glucose 04/2006   PCOS (polycystic ovarian syndrome)    per gyn   Sleep apnea     Past Surgical History:  Procedure Laterality Date   COLONOSCOPY N/A 12/16/2014   Procedure: COLONOSCOPY;  Surgeon: Rogene [place], MD;  [facility];  Service: Endoscopy;  Laterality: N/A;  830   COLONOSCOPY WITH PROPOFOL N/A 08/18/2020   Procedure: COLONOSCOPY WITH PROPOFOL;  Surgeon: Rogene Houston, MD;  [facility];  Service: Endoscopy;  Laterality: N/A;  930   POLYPECTOMY  08/18/2020   Procedure: POLYPECTOMY INTESTINAL;  Surgeon: Rogene Houston, MD;  [facility];  Service: Endoscopy;;    There were no vitals filed for this visit.   Subjective Assessment - 02/28/21 1452     Subjective STates no real pain or soreness in a while and exercises are going well. Likes the ball  chair.    Pertinent History DB    How long can you sit comfortably? 02/15/21: couple of  hours before increased stiffness    How long can you stand comfortably? 02/15/21: Reoprts of increased pain following 30 minutes    Patient Stated Goals to have less pain    Currently in Pain? No/denies                [facility] PT Assessment - 02/28/21 0001       Assessment   Medical Diagnosis LBP    Referring Provider (PT) Levy Pupa PA    Next MD Visit 3 months; call to schedule following PT                           [facility] Adult PT Treatment/Exercise - 02/28/21 0001       Lumbar Exercises: Stretches   Other Lumbar Stretch Exercise lumbar flexion with ball 2 minutes total then lateral flexion with ball 2 minutes total      Lumbar Exercises: Standing   Shoulder Extension 15 reps;Both;Theraband   2 sets,   Theraband Level (Shoulder Extension) Level 2 (Red)    Other Standing Lumbar Exercises lateral stepping wiht anchored pull red theraband double x5 each direction.    Other Standing Lumbar Exercises pallof press 2x15 bilateral Red theraband      Lumbar  Exercises: Seated   Long [employer] on Chair 15 reps;Both   2 sets on ball chair - cues for sitting tall   Other Seated Lumbar Exercises seated on ball pevlic tilts 2 minutes anterior and posterior then lateral, then circles x15 each direction    Other Seated Lumbar Exercises rhythmic perturbations all directions - forward flexion slight pull in lumbar spine. 5 minutes total                      PT Short Term Goals - 02/08/21 6644       PT SHORT TERM GOAL #1   Title Patient will report at least 25% improvement in overall symptoms and/or function to demonstrate improved functional mobility    Time 3    Period Weeks    Status New    Target Date 03/01/21      PT SHORT TERM GOAL #2   Title Patient will be independent in self management strategies to improve quality of life and functional outcomes.    Time 3    Period Weeks    Status New    Target Date 03/01/21      PT SHORT TERM GOAL #3    Title Patient will demonstrate good ergonomic set up at work and report using it 100% of time    Time 3    Period Weeks    Status New    Target Date 03/01/21               PT Long Term Goals - 02/08/21 0823       PT LONG TERM GOAL #1   Title Patient will report no radicular symptoms to improve QOL    Time 6    Period Weeks    Status New    Target Date 03/22/21      PT LONG TERM GOAL #2   Title Patient will improve on FOTO score to meet predicted outcomes to demonstrate improved functional mobility.    Time 6    Period Weeks    Status New    Target Date 03/22/21      PT LONG TERM GOAL #3   Title Patient will report at least 50% improvement in overall symptoms and/or function to demonstrate improved functional mobility    Time 6    Period Weeks    Status New    Target Date 03/22/21                   Plan - 02/28/21 1453     Clinical Impression Statement Continued to progress core stabilization exercises which were tolerated well. Fatigue in muscles but no increase in pain noted during session. Slight pulling in lumbar spine with resisted lumbar flexion while in seated position. Fatigue with pallof press but no pain. Added new exercises to HEP. Will continue with current POC as tolerated.    Personal Factors and Comorbidities Comorbidity 1;Fitness;Profession    Comorbidities DB    Examination-Activity Limitations Sit;Bend    Examination-Participation Restrictions Cleaning;Driving;Occupation    Stability/Clinical Decision Making Stable/Uncomplicated    Rehab Potential Good    PT Frequency 2x / week    PT Duration 6 weeks    PT Treatment/Interventions ADLs/Self Care Home Management;Aquatic Therapy;Cryotherapy;Electrical Stimulation;Moist Heat;Traction;Balance training;Therapeutic exercise;Therapeutic activities;Neuromuscular re-education;Patient/family education;Manual techniques;Dry needling;Joint Manipulations;Spinal Manipulations    PT Next Visit Plan  continue with core stab activities as well as LE strength and lumbar mobility.    PT Home Exercise Plan bridges,  lumbar support and feet support at work; seated ball marches, kickouts. TB Rows seated on ball.; 7/11 pallof press, pelvic tilts, lumbar flexion    Consulted and Agree with Plan of Care Patient             Patient will benefit from skilled therapeutic intervention in order to improve the following deficits and impairments:  Pain, Decreased strength, Decreased range of motion, Postural dysfunction, Improper body mechanics  Visit Diagnosis: Muscle weakness (generalized)  Lumbar pain     Problem List Patient Active Problem List   Diagnosis Date Noted   Hyperlipidemia associated with type 2 diabetes mellitus ([place]) 09/15/2020   Gastroesophageal reflux disease without esophagitis 07/09/2020   Obstructive sleep apnea treated with continuous positive airway pressure (CPAP) 05/07/2017   Diabetes mellitus without complication ([place]) 34/96/1164   Hyperlipidemia LDL goal <100 01/27/2016   Post-menopausal bleeding 01/27/2016   Anxiety as acute reaction to exceptional stress 11/07/2014   Vitamin D deficiency 10/13/2014   Morbid obesity ([place]) 06/03/2014   Type 2 diabetes mellitus ([place]) 12/05/2013   Essential hypertension, benign 07/31/2013   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 [place], [place], [address] Phone: [phone]   Fax:  [phone]  Name: HARLEAN REGULA MRN: [id number] Date of Birth: 12/07/1960

## 2021-03-02 ENCOUNTER — Ambulatory Visit (HOSPITAL_COMMUNITY): Payer: No Typology Code available for payment source | Admitting: Physical Therapy

## 2021-03-02 ENCOUNTER — Other Ambulatory Visit: Payer: Self-pay | Admitting: Family Medicine

## 2021-03-02 ENCOUNTER — Other Ambulatory Visit: Payer: Self-pay

## 2021-03-02 DIAGNOSIS — M6281 Muscle weakness (generalized): Secondary | ICD-10-CM | POA: Diagnosis not present

## 2021-03-02 DIAGNOSIS — M545 Low back pain, unspecified: Secondary | ICD-10-CM

## 2021-03-02 NOTE — Therapy (Signed)
[place], [place], [address] Phone: [phone]   Fax:  [phone]  Physical Therapy Treatment  Patient Details  Name: Susan Becker MRN: [id number] Date of Birth: [birth year] Referring Provider (PT): Levy Pupa PA   Encounter Date: 03/02/2021   PT End of Session - 03/02/21 1552     Visit Number 5    Number of Visits 12    Date for PT Re-Evaluation 03/22/21    Authorization Type medcost - VL 60 wit no PT apts used    Authorization - Visit Number 5    Authorization - Number of Visits 60    Progress Note Due on Visit 10    PT Start Time 1406    PT Stop Time 3710    PT Time Calculation (min) 39 min    Activity Tolerance Patient tolerated treatment well;No increased pain    Behavior During Therapy [facility] for tasks assessed/performed             Past Medical History:  Diagnosis Date   Diabetes mellitus without complication (HCC)    GERD (gastroesophageal reflux disease)    HTN (hypertension) 09/2010   Impaired fasting glucose 04/2006   PCOS (polycystic ovarian syndrome)    per gyn   Sleep apnea     Past Surgical History:  Procedure Laterality Date   COLONOSCOPY N/A 12/16/2014   Procedure: COLONOSCOPY;  Surgeon: Rogene Houston, MD;  [facility];  Service: Endoscopy;  Laterality: N/A;  830   COLONOSCOPY WITH PROPOFOL N/A 08/18/2020   Procedure: COLONOSCOPY WITH PROPOFOL;  Surgeon: Rogene Houston, MD;  [facility];  Service: Endoscopy;  Laterality: N/A;  930   POLYPECTOMY  08/18/2020   Procedure: POLYPECTOMY INTESTINAL;  Surgeon: Rogene Houston, MD;  [facility];  Service: Endoscopy;;    There were no vitals filed for this visit.   Subjective Assessment - 03/02/21 1411     Subjective pt reports no pain or issues today . Exercises are going good at home.    Currently in Pain? No/denies                               [facility] Adult PT  Treatment/Exercise - 03/02/21 0001       Lumbar Exercises: Stretches   Active Hamstring Stretch 2 reps;30 seconds    Active Hamstring Stretch Limitations with towel in supine    Lower Trunk Rotation 5 reps;10 seconds      Lumbar Exercises: Standing   Shoulder Extension Both;15 reps;Theraband    Theraband Level (Shoulder Extension) Level 3 (Green)      Lumbar Exercises: Seated   Sit to Stand 10 reps    Sit to Stand Limitations no UE      Lumbar Exercises: Supine   Bridge 15 reps    Bridge Limitations with physioball to challenge stab    Straight Leg Raise 15 reps    Straight Leg Raises Limitations each LE with core stab      Lumbar Exercises: Sidelying   Clam Both;15 reps    Hip Abduction Both;Limitations;20 reps      Lumbar Exercises: Prone   Straight Leg Raise Limitations;15 reps                      PT Short Term Goals - 02/08/21 6269       PT SHORT  TERM GOAL #1   Title Patient will report at least 25% improvement in overall symptoms and/or function to demonstrate improved functional mobility    Time 3    Period Weeks    Status New    Target Date 03/01/21      PT SHORT TERM GOAL #2   Title Patient will be independent in self management strategies to improve quality of life and functional outcomes.    Time 3    Period Weeks    Status New    Target Date 03/01/21      PT SHORT TERM GOAL #3   Title Patient will demonstrate good ergonomic set up at work and report using it 100% of time    Time 3    Period Weeks    Status New    Target Date 03/01/21               PT Long Term Goals - 02/08/21 0823       PT LONG TERM GOAL #1   Title Patient will report no radicular symptoms to improve QOL    Time 6    Period Weeks    Status New    Target Date 03/22/21      PT LONG TERM GOAL #2   Title Patient will improve on FOTO score to meet predicted outcomes to demonstrate improved functional mobility.    Time 6    Period Weeks    Status New     Target Date 03/22/21      PT LONG TERM GOAL #3   Title Patient will report at least 50% improvement in overall symptoms and/or function to demonstrate improved functional mobility    Time 6    Period Weeks    Status New    Target Date 03/22/21                   Plan - 03/02/21 1551     Clinical Impression Statement Continued with primary focus on core stability.  Pt has overall improved with no pain in the last couple of weeks.  Added ball abdominal exercises with progression of bridge using ball to challenge core stability.   Pt with minimal challenge keeping ball controlled and cues for breathing and technique. Progressed clams to sidelying with tactile cues needed when completing Lt LE to improve stability/prevent rocking backward.    Personal Factors and Comorbidities Comorbidity 1;Fitness;Profession    Comorbidities DB    Examination-Activity Limitations Sit;Bend    Examination-Participation Restrictions Cleaning;Driving;Occupation    Stability/Clinical Decision Making Stable/Uncomplicated    Rehab Potential Good    PT Frequency 2x / week    PT Duration 6 weeks    PT Treatment/Interventions ADLs/Self Care Home Management;Aquatic Therapy;Cryotherapy;Electrical Stimulation;Moist Heat;Traction;Balance training;Therapeutic exercise;Therapeutic activities;Neuromuscular re-education;Patient/family education;Manual techniques;Dry needling;Joint Manipulations;Spinal Manipulations    PT Next Visit Plan continue with core stab activities as well as LE strength and lumbar mobility.    PT Home Exercise Plan bridges, lumbar support and feet support at work; seated ball marches, kickouts. TB Rows seated on ball.; 7/11 pallof press, pelvic tilts, lumbar flexion    Consulted and Agree with Plan of Care Patient             Patient will benefit from skilled therapeutic intervention in order to improve the following deficits and impairments:  Pain, Decreased strength, Decreased range of  motion, Postural dysfunction, Improper body mechanics  Visit Diagnosis: Muscle weakness (generalized)  Lumbar pain     Problem  List Patient Active Problem List   Diagnosis Date Noted   Hyperlipidemia associated with type 2 diabetes mellitus ([employer]) 09/15/2020   Gastroesophageal reflux disease without esophagitis 07/09/2020   Obstructive sleep apnea treated with continuous positive airway pressure (CPAP) 05/07/2017   Diabetes mellitus without complication ([place]) 44/08/270   Hyperlipidemia LDL goal <100 01/27/2016   Post-menopausal bleeding 01/27/2016   Anxiety as acute reaction to exceptional stress 11/07/2014   Vitamin D deficiency 10/13/2014   Morbid obesity ([place]) 06/03/2014   Type 2 diabetes mellitus ([place]) 12/05/2013   Essential hypertension, benign 07/31/2013   Neuropathy of hand 07/31/2013   Teena Irani, PTA/CLT [phone]  Teena Irani 03/02/2021, 3:53 PM  [place] [address] [place], [place], [address] Phone: [phone]   Fax:  [phone]  Name: Susan Becker MRN: [id number] Date of Birth: [birth year]

## 2021-03-07 ENCOUNTER — Other Ambulatory Visit: Payer: Self-pay

## 2021-03-07 ENCOUNTER — Ambulatory Visit (HOSPITAL_COMMUNITY): Payer: No Typology Code available for payment source | Admitting: Physical Therapy

## 2021-03-07 DIAGNOSIS — M545 Low back pain, unspecified: Secondary | ICD-10-CM

## 2021-03-07 DIAGNOSIS — M6281 Muscle weakness (generalized): Secondary | ICD-10-CM | POA: Diagnosis not present

## 2021-03-07 NOTE — Therapy (Signed)
[place], [place], [address] Phone: [phone]   Fax:  [phone]  Physical Therapy Treatment  Patient Details  Name: Susan Becker MRN: [id number] Date of Birth: 1961-04-24 Referring Provider (PT): Levy Pupa PA   Encounter Date: 03/07/2021   PT End of Session - 03/07/21 1434     Visit Number 6    Number of Visits 12    Date for PT Re-Evaluation 03/22/21    Authorization Type medcost - VL 60 wit no PT apts used    Authorization - Visit Number 6    Authorization - Number of Visits 60    Progress Note Due on Visit 10    PT Start Time 1324    PT Stop Time 1444    PT Time Calculation (min) 42 min    Activity Tolerance Patient tolerated treatment well;No increased pain    Behavior During Therapy [facility] for tasks assessed/performed             Past Medical History:  Diagnosis Date   Diabetes mellitus without complication (HCC)    GERD (gastroesophageal reflux disease)    HTN (hypertension) 09/2010   Impaired fasting glucose 04/2006   PCOS (polycystic ovarian syndrome)    per gyn   Sleep apnea     Past Surgical History:  Procedure Laterality Date   COLONOSCOPY N/A 12/16/2014   Procedure: COLONOSCOPY;  Surgeon: Rogene Houston, MD;  [facility];  Service: Endoscopy;  Laterality: N/A;  830   COLONOSCOPY WITH PROPOFOL N/A 08/18/2020   Procedure: COLONOSCOPY WITH PROPOFOL;  Surgeon: Rogene Houston, MD;  [facility];  Service: Endoscopy;  Laterality: N/A;  930   POLYPECTOMY  08/18/2020   Procedure: POLYPECTOMY INTESTINAL;  Surgeon: Rogene Houston, MD;  [facility];  Service: Endoscopy;;    There were no vitals filed for this visit.   Subjective Assessment - 03/07/21 1435     Subjective Pt states she was sore after last visit.  Reports no pain or issues; being compliant with HEP    Currently in Pain? No/denies                                [facility] Adult PT Treatment/Exercise - 03/07/21 0001       Lumbar Exercises: Standing   Forward Lunge 15 reps    Forward Lunge Limitations onto 4" step no UE assist working on stability    Scapular Retraction Both;15 reps;Theraband    Theraband Level (Scapular Retraction) Level 3 (Green)    Row Both;15 reps;Theraband    Theraband Level (Row) Level 3 (Green)    Shoulder Extension Both;15 reps;Theraband    Theraband Level (Shoulder Extension) Level 3 (Green)    Other Standing Lumbar Exercises pallof press 15X bilateral green theraband NBOS      Lumbar Exercises: Supine   Bridge 15 reps    Bridge Limitations with physioball to challenge stab    Straight Leg Raise 15 reps    Straight Leg Raises Limitations each LE with core stab    Large Ball Abdominal Isometric 15 reps    Large Ball Oblique Isometric 15 reps      Lumbar Exercises: Sidelying   Clam Both;15 reps    Hip Abduction Both;Limitations;20 reps      Lumbar Exercises: Prone   Straight Leg Raise Limitations;15 reps  PT Short Term Goals - 02/08/21 6010       PT SHORT TERM GOAL #1   Title Patient will report at least 25% improvement in overall symptoms and/or function to demonstrate improved functional mobility    Time 3    Period Weeks    Status New    Target Date 03/01/21      PT SHORT TERM GOAL #2   Title Patient will be independent in self management strategies to improve quality of life and functional outcomes.    Time 3    Period Weeks    Status New    Target Date 03/01/21      PT SHORT TERM GOAL #3   Title Patient will demonstrate good ergonomic set up at work and report using it 100% of time    Time 3    Period Weeks    Status New    Target Date 03/01/21               PT Long Term Goals - 02/08/21 0823       PT LONG TERM GOAL #1   Title Patient will report no radicular symptoms to improve QOL    Time 6    Period Weeks     Status New    Target Date 03/22/21      PT LONG TERM GOAL #2   Title Patient will improve on FOTO score to meet predicted outcomes to demonstrate improved functional mobility.    Time 6    Period Weeks    Status New    Target Date 03/22/21      PT LONG TERM GOAL #3   Title Patient will report at least 50% improvement in overall symptoms and/or function to demonstrate improved functional mobility    Time 6    Period Weeks    Status New    Target Date 03/22/21                   Plan - 03/07/21 1446     Clinical Impression Statement Began session with theraband for postural  strengthening then progressed core with addition of physioball isometrics for abdominal mm.   Pt required minimal cues during session, however most difficulty completing prone hip extension due to weakness.   Tactile cues still needed with clams to improve stability/prevent rocking backward.  No issues/pain reported at end of session.    Personal Factors and Comorbidities Comorbidity 1;Fitness;Profession    Comorbidities DB    Examination-Activity Limitations Sit;Bend    Examination-Participation Restrictions Cleaning;Driving;Occupation    Stability/Clinical Decision Making Stable/Uncomplicated    Rehab Potential Good    PT Frequency 2x / week    PT Duration 6 weeks    PT Treatment/Interventions ADLs/Self Care Home Management;Aquatic Therapy;Cryotherapy;Electrical Stimulation;Moist Heat;Traction;Balance training;Therapeutic exercise;Therapeutic activities;Neuromuscular re-education;Patient/family education;Manual techniques;Dry needling;Joint Manipulations;Spinal Manipulations    PT Next Visit Plan continue with core stab activities as well as LE strength and lumbar mobility.    PT Home Exercise Plan bridges, lumbar support and feet support at work; seated ball marches, kickouts. TB Rows seated on ball.; 7/11 pallof press, pelvic tilts, lumbar flexion    Consulted and Agree with Plan of Care Patient              Patient will benefit from skilled therapeutic intervention in order to improve the following deficits and impairments:  Pain, Decreased strength, Decreased range of motion, Postural dysfunction, Improper body mechanics  Visit Diagnosis: Muscle weakness (generalized)  Lumbar pain  Problem List Patient Active Problem List   Diagnosis Date Noted   Hyperlipidemia associated with type 2 diabetes mellitus ([place]) 09/15/2020   Gastroesophageal reflux disease without esophagitis 07/09/2020   Obstructive sleep apnea treated with continuous positive airway pressure (CPAP) 05/07/2017   Diabetes mellitus without complication ([place]) 94/80/1655   Hyperlipidemia LDL goal <100 01/27/2016   Post-menopausal bleeding 01/27/2016   Anxiety as acute reaction to exceptional stress 11/07/2014   Vitamin D deficiency 10/13/2014   Morbid obesity ([place]) 06/03/2014   Type 2 diabetes mellitus ([place]) 12/05/2013   Essential hypertension, benign 07/31/2013   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 [place], [place], [address] Phone: [phone]   Fax:  [phone]  Name: ERSA DELANEY MRN: [id number] Date of Birth: 02-21-1961

## 2021-03-09 ENCOUNTER — Encounter (HOSPITAL_COMMUNITY): Payer: PRIVATE HEALTH INSURANCE

## 2021-03-09 ENCOUNTER — Telehealth (HOSPITAL_COMMUNITY): Payer: Self-pay

## 2021-03-09 NOTE — Telephone Encounter (Signed)
Pt called to cx she is sick with sinus infection and will not be here today

## 2021-03-10 ENCOUNTER — Other Ambulatory Visit: Payer: Self-pay

## 2021-03-10 ENCOUNTER — Encounter: Payer: Self-pay | Admitting: Nurse Practitioner

## 2021-03-10 ENCOUNTER — Ambulatory Visit (INDEPENDENT_AMBULATORY_CARE_PROVIDER_SITE_OTHER): Payer: No Typology Code available for payment source | Admitting: Nurse Practitioner

## 2021-03-10 VITALS — BP 165/98 | HR 82 | Temp 96.8°F | Ht 64.5 in | Wt 257.0 lb

## 2021-03-10 DIAGNOSIS — E119 Type 2 diabetes mellitus without complications: Secondary | ICD-10-CM

## 2021-03-10 DIAGNOSIS — J020 Streptococcal pharyngitis: Secondary | ICD-10-CM

## 2021-03-10 DIAGNOSIS — R059 Cough, unspecified: Secondary | ICD-10-CM

## 2021-03-10 DIAGNOSIS — B349 Viral infection, unspecified: Secondary | ICD-10-CM

## 2021-03-10 DIAGNOSIS — F419 Anxiety disorder, unspecified: Secondary | ICD-10-CM

## 2021-03-10 DIAGNOSIS — I10 Essential (primary) hypertension: Secondary | ICD-10-CM

## 2021-03-10 DIAGNOSIS — K219 Gastro-esophageal reflux disease without esophagitis: Secondary | ICD-10-CM

## 2021-03-10 LAB — POCT RAPID STREP A (OFFICE): Rapid Strep A Screen: POSITIVE — AB

## 2021-03-10 MED ORDER — AMOXICILLIN 500 MG PO CAPS: ORAL_CAPSULE | ORAL | 0 refills | Status: DC

## 2021-03-10 NOTE — Progress Notes (Signed)
po  Subjective:    Patient ID: Susan Becker, female    DOB: May 04, 1961, 60 y.o.   MRN: [id number]  Diabetes She presents for her follow-up diabetic visit. She has type 2 diabetes mellitus.  Presents for routine follow-up.  Has been seeing a specialist in [place] to help her with her diabetes and diet.  They have adjusted her Lantus dosage.  Fasting blood sugars are now mostly under 200 averaging about 160.  Denies any polydipsia polyphagia or headaches.  No chest pain/ischemic type pain or shortness of breath.  States pantoprazole is controlling her acid reflux symptoms completely.  Is wearing her CPAP on a nightly basis.  Adherent to medication regimen.  Anxiety doing well with Lexapro.  Began having cold symptoms 2 days ago.  Joint pain.  No fever.  Fatigue.  Frequent cough producing yellowish mucus at times.  No wheezing.  Sore throat.  Some right ear pain at times.  Facial pressure.  No known contacts.  Non-smoker.  Had 2 days of amoxicillin leftover from a previous illness which she has taken.  Taking fluids well.  Voiding normal limit.       Objective:   Physical Exam NAD.  Alert, oriented.  Fatigued in appearance.  Right TM mild clear effusion.  No erythema.  Left TM normal limit.  Pharynx moderate erythema, no exudate noted.  Mucous membranes moist.  Neck supple with mild soft anterior adenopathy.  Lungs clear.  Heart regular rate and rhythm.  Abdomen soft nondistended nontender. Today's Vitals   03/10/21 0823  BP: (!) 165/98  Pulse: 82  Temp: (!) 96.8 F (36 C)  SpO2: 97%  Weight: 257 lb (116.6 kg)  Height: 5' 4.5" (1.638 m)   Body mass index is 43.43 kg/m. Results for orders placed or performed in visit on 03/10/21  POCT rapid strep A  Result Value Ref Range   Rapid Strep A Screen Positive (A) Negative   Weight stable from previous visits.       Assessment & Plan:   Problem List Items Addressed This Visit       Cardiovascular and Mediastinum   Essential  hypertension, benign     Digestive   Gastroesophageal reflux disease without esophagitis     Endocrine   Diabetes mellitus without complication (HCC) - Primary     Other   Anxiety   Morbid obesity ([place])   Other Visit Diagnoses     Cough       Relevant Orders   Novel Coronavirus, NAA ([facility])   Acute streptococcal pharyngitis       Relevant Orders   POCT rapid strep A (Completed)   Viral illness       Relevant Orders   Novel Coronavirus, NAA ([facility])      Meds ordered this encounter  Medications   amoxicillin (AMOXIL) 500 MG capsule    Sig: Take 2 tabs (1000 mg) po once a day x 10 days    Dispense:  20 capsule    Refill:  0    Order Specific Question:   Supervising Provider    Answer:   Sallee Lange A [[id number]]   Continue current medications as directed.  Encouraged follow-up with diabetes program.  Encouraged continued healthy diet, increase activity. Start amoxicillin as directed for strep pharyngitis.  COVID testing pending.  Warning signs reviewed.  Call back if symptoms worsen or persist.  Discussed OTC meds to be used for symptomatic care.

## 2021-03-11 ENCOUNTER — Ambulatory Visit: Payer: PRIVATE HEALTH INSURANCE | Admitting: Nurse Practitioner

## 2021-03-11 LAB — NOVEL CORONAVIRUS, NAA: SARS-CoV-2, NAA: DETECTED — AB

## 2021-03-11 LAB — SARS-COV-2, NAA 2 DAY TAT

## 2021-03-14 ENCOUNTER — Ambulatory Visit (HOSPITAL_COMMUNITY): Payer: No Typology Code available for payment source | Admitting: Physical Therapy

## 2021-03-16 ENCOUNTER — Other Ambulatory Visit: Payer: Self-pay | Admitting: Nurse Practitioner

## 2021-03-16 ENCOUNTER — Encounter (HOSPITAL_COMMUNITY): Payer: PRIVATE HEALTH INSURANCE

## 2021-03-16 DIAGNOSIS — E119 Type 2 diabetes mellitus without complications: Secondary | ICD-10-CM

## 2021-03-22 ENCOUNTER — Ambulatory Visit (HOSPITAL_COMMUNITY): Payer: No Typology Code available for payment source | Attending: Chiropractic Medicine | Admitting: Physical Therapy

## 2021-03-22 ENCOUNTER — Other Ambulatory Visit: Payer: Self-pay

## 2021-03-22 ENCOUNTER — Encounter (HOSPITAL_COMMUNITY): Payer: Self-pay | Admitting: Physical Therapy

## 2021-03-22 DIAGNOSIS — M6281 Muscle weakness (generalized): Secondary | ICD-10-CM | POA: Diagnosis present

## 2021-03-22 DIAGNOSIS — M545 Low back pain, unspecified: Secondary | ICD-10-CM | POA: Diagnosis present

## 2021-03-22 NOTE — Therapy (Signed)
[facility] [address] [place], [place], [address] Phone: [phone]   Fax:  [phone]  Physical Therapy Treatment and Discharge Note  Patient Details  Name: Susan Becker MRN: [id number] Date of Birth: Sep 15, 1960 Referring Provider (PT): Levy Pupa PA  PHYSICAL THERAPY DISCHARGE SUMMARY  Visits from Start of Care: 7  Current functional level related to goals / functional outcomes: See below  Remaining deficits: See below   Education / Equipment: See below  Patient agrees to discharge. Patient goals were met. Patient is being discharged due to meeting the stated rehab goals.   Encounter Date: 03/22/2021   PT End of Session - 03/22/21 0921     Visit Number 7    Number of Visits 12    Date for PT Re-Evaluation 03/22/21    Authorization Type medcost - VL 60 wit no PT apts used    Authorization - Visit Number 7    Authorization - Number of Visits 60    Progress Note Due on Visit 10    PT Start Time 0921   late to checkin   PT Stop Time 0949    PT Time Calculation (min) 28 min    Activity Tolerance Patient tolerated treatment well;No increased pain    Behavior During Therapy [facility] for tasks assessed/performed             Past Medical History:  Diagnosis Date   Diabetes mellitus without complication (HCC)    GERD (gastroesophageal reflux disease)    HTN (hypertension) 09/2010   Impaired fasting glucose 04/2006   PCOS (polycystic ovarian syndrome)    per gyn   Sleep apnea     Past Surgical History:  Procedure Laterality Date   COLONOSCOPY N/A 12/16/2014   Procedure: COLONOSCOPY;  Surgeon: Rogene Houston, MD;  [facility];  Service: Endoscopy;  Laterality: N/A;  830   COLONOSCOPY WITH PROPOFOL N/A 08/18/2020   Procedure: COLONOSCOPY WITH PROPOFOL;  Surgeon: Rogene Houston, MD;  Location: [facility];  Service: Endoscopy;  Laterality: N/A;  930   POLYPECTOMY  08/18/2020   Procedure: POLYPECTOMY  INTESTINAL;  Surgeon: Rogene Houston, MD;  Location: [facility];  Service: Endoscopy;;    There were no vitals filed for this visit.   Subjective Assessment - 03/22/21 0924     Subjective States that she did some of her exercises since she was last in. States she had COVID and strep throat all at one time which is why she couldn't come in. States that overall she feels about 75% better since start of PT. States that she has not had a lot of pain.    Currently in Pain? No/denies                [facility] PT Assessment - 03/22/21 0001       Assessment   Medical Diagnosis LBP    Referring Provider (PT) Levy Pupa PA    Next MD Visit 3 months; call to schedule following PT      Observation/Other Assessments   Focus on Therapeutic Outcomes (FOTO)  73% function, predicted was 67% function      AROM   Lumbar Flexion 25% limited    Lumbar Extension 50% limited   no symptoms   Lumbar - Right Side Bend 25% limited    Lumbar - Left Side Bend 25% limited      Strength   Right Hip Flexion 4+/5    Left  Hip Flexion 4+/5   no pain   Right Knee Flexion 4/5    Right Knee Extension 4+/5    Left Knee Flexion 4+/5    Left Knee Extension 5/5    Right Ankle Dorsiflexion 5/5                           [facility] Adult PT Treatment/Exercise - 03/22/21 0001       Lumbar Exercises: Seated   Other Seated Lumbar Exercises wrist flexion adn extensin towel twist x15 5" holds each' prayer stretch x10 10" holds                    PT Education - 03/22/21 0946     Education Details on current presenation, on anatomy, on plan moving forward, on basic HEP for wrist strength and ROM.    Person(s) Educated Patient    Methods Explanation    Comprehension Verbalized understanding              PT Short Term Goals - 03/22/21 0931       PT SHORT TERM GOAL #1   Title Patient will report at least 25% improvement in overall symptoms and/or function to demonstrate  improved functional mobility    Time 3    Period Weeks    Status Achieved    Target Date 03/01/21      PT SHORT TERM GOAL #2   Title Patient will be independent in self management strategies to improve quality of life and functional outcomes.    Baseline every other day    Time 3    Period Weeks    Status Achieved    Target Date 03/01/21      PT SHORT TERM GOAL #3   Title Patient will demonstrate good ergonomic set up at work and report using it 100% of time    Time 3    Period Weeks    Status Achieved    Target Date 03/01/21               PT Long Term Goals - 03/22/21 0925       PT LONG TERM GOAL #1   Title Patient will report no radicular symptoms to improve QOL    Time 6    Period Weeks    Status Achieved      PT LONG TERM GOAL #2   Title Patient will improve on FOTO score to meet predicted outcomes to demonstrate improved functional mobility.    Time 6    Period Weeks    Status Achieved      PT LONG TERM GOAL #3   Title Patient will report at least 50% improvement in overall symptoms and/or function to demonstrate improved functional mobility    Time 6    Period Weeks    Status Achieved                   Plan - 03/22/21 2956     Clinical Impression Statement Patient present for a reassessment on this date. She has met all short and long term goals and feels confident in her current HEP moving forward. Answered all questions and provided patient with basic HEP for wrist flexibility and strengthening per patient request. Patient to DC from PT to HEP secondary to progress made a this time.    Personal Factors and Comorbidities Comorbidity 1;Fitness;Profession    Comorbidities DB    Examination-Activity Limitations  Sit;Bend    Examination-Participation Restrictions Cleaning;Driving;Occupation    Stability/Clinical Decision Making Stable/Uncomplicated    Rehab Potential Good    PT Frequency 2x / week    PT Duration 6 weeks    PT  Treatment/Interventions ADLs/Self Care Home Management;Aquatic Therapy;Cryotherapy;Electrical Stimulation;Moist Heat;Traction;Balance training;Therapeutic exercise;Therapeutic activities;Neuromuscular re-education;Patient/family education;Manual techniques;Dry needling;Joint Manipulations;Spinal Manipulations    PT Next Visit Plan DC to HEP    PT Home Exercise Plan bridges, lumbar support and feet support at work; seated ball marches, kickouts. TB Rows seated on ball.; 7/11 pallof press, pelvic tilts, lumbar flexion    Consulted and Agree with Plan of Care Patient             Patient will benefit from skilled therapeutic intervention in order to improve the following deficits and impairments:  Pain, Decreased strength, Decreased range of motion, Postural dysfunction, Improper body mechanics  Visit Diagnosis: Muscle weakness (generalized)  Lumbar pain     Problem List Patient Active Problem List   Diagnosis Date Noted   Hyperlipidemia associated with type 2 diabetes mellitus ([place]) 09/15/2020   Gastroesophageal reflux disease without esophagitis 07/09/2020   Obstructive sleep apnea treated with continuous positive airway pressure (CPAP) 05/07/2017   Diabetes mellitus without complication ([place]) 72/62/0355   Hyperlipidemia LDL goal <100 01/27/2016   Post-menopausal bleeding 01/27/2016   Anxiety 11/07/2014   Vitamin D deficiency 10/13/2014   Morbid obesity ([place]) 06/03/2014   Type 2 diabetes mellitus ([place]) 12/05/2013   Essential hypertension, benign 07/31/2013   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

## 2021-03-24 ENCOUNTER — Encounter (HOSPITAL_COMMUNITY): Payer: PRIVATE HEALTH INSURANCE | Admitting: Physical Therapy

## 2021-04-06 ENCOUNTER — Other Ambulatory Visit (HOSPITAL_COMMUNITY): Payer: Self-pay | Admitting: Family Medicine

## 2021-04-06 DIAGNOSIS — Z1231 Encounter for screening mammogram for malignant neoplasm of breast: Secondary | ICD-10-CM

## 2021-04-18 ENCOUNTER — Encounter: Payer: Self-pay | Admitting: Family Medicine

## 2021-04-21 NOTE — Telephone Encounter (Signed)
Nurses  We will be happy to help fill out/sign FMLA papers  It will be necessary for the patient to be specific when she drops them off when she wants to be out if this is a intermittent being out when necessary or if this is a defined set of time  Preferably we like for patients to write this down so we have something specific to go on  Keep follow-up visit as planned thank you

## 2021-04-25 ENCOUNTER — Other Ambulatory Visit: Payer: Self-pay | Admitting: Family Medicine

## 2021-04-26 ENCOUNTER — Telehealth: Payer: Self-pay | Admitting: Family Medicine

## 2021-04-26 NOTE — Telephone Encounter (Signed)
Patient dropped off FMLA to be completed in your folder.

## 2021-04-27 ENCOUNTER — Other Ambulatory Visit: Payer: Self-pay

## 2021-04-27 ENCOUNTER — Ambulatory Visit (INDEPENDENT_AMBULATORY_CARE_PROVIDER_SITE_OTHER): Payer: No Typology Code available for payment source | Admitting: Family Medicine

## 2021-04-27 ENCOUNTER — Telehealth: Payer: Self-pay | Admitting: *Deleted

## 2021-04-27 DIAGNOSIS — F439 Reaction to severe stress, unspecified: Secondary | ICD-10-CM

## 2021-04-27 LAB — HM DIABETES EYE EXAM

## 2021-04-27 NOTE — Telephone Encounter (Signed)
[name], balbo are scheduled for a virtual visit with your provider today.    Just as we do with appointments in the office, we must obtain your consent to participate.  Your consent will be active for this visit and any virtual visit you may have with one of our providers in the next 365 days.    If you have a MyChart account, I can also send a copy of this consent to you electronically.  All virtual visits are billed to your insurance company just like a traditional visit in the office.  As this is a virtual visit, video technology does not allow for your provider to perform a traditional examination.  This may limit your provider's ability to fully assess your condition.  If your provider identifies any concerns that need to be evaluated in person or the need to arrange testing such as labs, EKG, etc, we will make arrangements to do so.    Although advances in technology are sophisticated, we cannot ensure that it will always work on either your end or our end.  If the connection with a video visit is poor, we may have to switch to a telephone visit.  With either a video or telephone visit, we are not always able to ensure that we have a secure connection.   I need to obtain your verbal consent now.   Are you willing to proceed with your visit today?   Susan Becker has provided verbal consent on 04/27/2021 for a virtual visit (video or telephone).

## 2021-04-27 NOTE — Telephone Encounter (Signed)
Fmla completed

## 2021-04-27 NOTE — Progress Notes (Signed)
   Subjective:    Patient ID: Georgeann Oppenheim, female    DOB: Feb 04, 1961, 60 y.o.   MRN: [id number]  HPI  Patient calls to discuss FMLA due to stress Patient going through a lot of stress currently finding her self feeling sad at times anxious at times stressed at times all because of her sister going through terminal cancer.  At times it interferes with her overall health with her diabetes as well as her ability to work Therefore we will fill out FMLA papers accordingly  Virtual Visit via Telephone Note  I connected with Kaetlin T Gritton on 04/27/21 at 10:20 AM EDT by telephone and verified that I am speaking with the correct person using two identifiers.  Location: Patient: home Provider: office   I discussed the limitations, risks, security and privacy concerns of performing an evaluation and management service by telephone and the availability of in person appointments. I also discussed with the patient that there may be a patient responsible charge related to this service. The patient expressed understanding and agreed to proceed.   History of Present Illness:    Observations/Objective:   Assessment and Plan:   Follow Up Instructions:    I discussed the assessment and treatment plan with the patient. The patient was provided an opportunity to ask questions and all were answered. The patient agreed with the plan and demonstrated an understanding of the instructions.   The patient was advised to call back or seek an in-person evaluation if the symptoms worsen or if the condition fails to improve as anticipated.  I provided 15 minutes of non-face-to-face time during this encounter.    Review of Systems     Objective:   Physical Exam  Today's visit was via telephone Physical exam was not possible for this visit       Assessment & Plan:  Stress related issues FMLA will be filled out Recommend for the patient to rest when possible Difficult times dealing with her  sister's terminal cancer Long-term outlook for her sister is poor Patient will need FMLA at least for the next 3 months

## 2021-05-09 ENCOUNTER — Other Ambulatory Visit: Payer: Self-pay | Admitting: Family Medicine

## 2021-05-09 ENCOUNTER — Other Ambulatory Visit: Payer: Self-pay | Admitting: Nurse Practitioner

## 2021-05-26 ENCOUNTER — Other Ambulatory Visit: Payer: Self-pay | Admitting: Family Medicine

## 2021-06-06 ENCOUNTER — Ambulatory Visit (HOSPITAL_COMMUNITY): Payer: No Typology Code available for payment source

## 2021-06-06 ENCOUNTER — Other Ambulatory Visit (HOSPITAL_COMMUNITY): Payer: Self-pay

## 2021-06-06 MED ORDER — INFLUENZA VAC SPLIT QUAD 0.5 ML IM SUSY
PREFILLED_SYRINGE | INTRAMUSCULAR | 0 refills | Status: DC
  Filled 2021-06-06: qty 0.5, 1d supply, fill #0

## 2021-06-09 ENCOUNTER — Ambulatory Visit (HOSPITAL_COMMUNITY)
Admission: RE | Admit: 2021-06-09 | Discharge: 2021-06-09 | Disposition: A | Discharge status: Home / Self Care | Payer: No Typology Code available for payment source | Source: Ambulatory Visit | Attending: Family Medicine | Admitting: Family Medicine

## 2021-06-09 ENCOUNTER — Other Ambulatory Visit: Payer: Self-pay

## 2021-06-09 DIAGNOSIS — Z1231 Encounter for screening mammogram for malignant neoplasm of breast: Secondary | ICD-10-CM | POA: Insufficient documentation

## 2021-07-09 ENCOUNTER — Other Ambulatory Visit: Payer: Self-pay | Admitting: Family Medicine

## 2021-07-11 ENCOUNTER — Other Ambulatory Visit: Payer: Self-pay

## 2021-07-11 MED ORDER — METFORMIN HCL 1000 MG PO TABS: 1000.0000 mg | ORAL_TABLET | Freq: Two times a day (BID) | ORAL | 2 refills | Status: DC

## 2021-07-22 ENCOUNTER — Encounter: Payer: No Typology Code available for payment source | Admitting: Family Medicine

## 2021-07-26 ENCOUNTER — Telehealth: Payer: Self-pay | Admitting: *Deleted

## 2021-07-26 DIAGNOSIS — Z79899 Other long term (current) drug therapy: Secondary | ICD-10-CM

## 2021-07-26 DIAGNOSIS — I10 Essential (primary) hypertension: Secondary | ICD-10-CM

## 2021-07-26 DIAGNOSIS — E785 Hyperlipidemia, unspecified: Secondary | ICD-10-CM

## 2021-07-26 NOTE — Telephone Encounter (Signed)
Nurses-please touch base with patient.  Let her know with endocrinology seeing her more than likely endocrinology is checking everything that needs to be checked.  Typically for a physical we would want to do a CBC, lipid, liver, met 7-if she feels that this is being checked by her endocrinologist she could have the lab work forwarded here.  If she is uncertain she should do lab work Also A1c should be done by her endocrinologist at least every 4 to 6 months

## 2021-07-26 NOTE — Telephone Encounter (Signed)
Patient has appointment Friday with Hoyle Sauer and wants to know if sh needs lab work Diabetes managed by Endocrinology Last labs 11/2020: TSH, Lipid, CMP

## 2021-07-26 NOTE — Telephone Encounter (Signed)
Pt contacted and verbalized understanding. Pt states she is seeing endo every 4 months or so but is not sure if they are checking for her cholesterol. Lab orders placed. Pt is seeing endo next week

## 2021-07-28 LAB — HEPATIC FUNCTION PANEL
ALT: 12 IU/L (ref 0–32)
AST: 15 IU/L (ref 0–40)
Albumin: 4.6 g/dL (ref 3.8–4.9)
Alkaline Phosphatase: 66 IU/L (ref 44–121)
Bilirubin Total: 0.3 mg/dL (ref 0.0–1.2)
Bilirubin, Direct: <0.1 mg/dL (ref 0.00–0.40)
Total Protein: 7.6 g/dL (ref 6.0–8.5)

## 2021-07-28 LAB — BASIC METABOLIC PANEL
BUN/Creatinine Ratio: 16 (ref 9–23)
BUN: 11 mg/dL (ref 6–24)
CO2: 24 mmol/L (ref 20–29)
Calcium: 9.4 mg/dL (ref 8.7–10.2)
Chloride: 102 mmol/L (ref 96–106)
Creatinine, Ser: 0.67 mg/dL (ref 0.57–1.00)
Glucose: 155 mg/dL — ABNORMAL HIGH (ref 70–99)
Potassium: 4.4 mmol/L (ref 3.5–5.2)
Sodium: 141 mmol/L (ref 134–144)
eGFR: 102 mL/min/{1.73_m2} (ref >59)

## 2021-07-28 LAB — LIPID PANEL
Chol/HDL Ratio: 3.6 ratio (ref 0.0–4.4)
Cholesterol, Total: 159 mg/dL (ref 100–199)
HDL: 44 mg/dL (ref >39)
LDL Chol Calc (NIH): 81 mg/dL (ref 0–99)
Triglycerides: 200 mg/dL — ABNORMAL HIGH (ref 0–149)
VLDL Cholesterol Cal: 34 mg/dL (ref 5–40)

## 2021-07-28 LAB — CBC WITH DIFFERENTIAL/PLATELET
Basophils Absolute: 0 10*3/uL (ref 0.0–0.2)
Basos: 0 %
EOS (ABSOLUTE): 0.2 10*3/uL (ref 0.0–0.4)
Eos: 2 %
Hematocrit: 36.8 % (ref 34.0–46.6)
Hemoglobin: 12.4 g/dL (ref 11.1–15.9)
Immature Grans (Abs): 0 10*3/uL (ref 0.0–0.1)
Immature Granulocytes: 0 %
Lymphocytes Absolute: 2.2 10*3/uL (ref 0.7–3.1)
Lymphs: 24 %
MCH: 29.2 pg (ref 26.6–33.0)
MCHC: 33.7 g/dL (ref 31.5–35.7)
MCV: 87 fL (ref 79–97)
Monocytes Absolute: 0.5 10*3/uL (ref 0.1–0.9)
Monocytes: 6 %
Neutrophils Absolute: 6.1 10*3/uL (ref 1.4–7.0)
Neutrophils: 68 %
Platelets: 319 10*3/uL (ref 150–450)
RBC: 4.24 x10E6/uL (ref 3.77–5.28)
RDW: 12.9 % (ref 11.7–15.4)
WBC: 9.1 10*3/uL (ref 3.4–10.8)

## 2021-07-29 ENCOUNTER — Encounter: Payer: Self-pay | Admitting: Nurse Practitioner

## 2021-07-29 ENCOUNTER — Other Ambulatory Visit: Payer: Self-pay

## 2021-07-29 ENCOUNTER — Ambulatory Visit (INDEPENDENT_AMBULATORY_CARE_PROVIDER_SITE_OTHER): Payer: No Typology Code available for payment source | Admitting: Nurse Practitioner

## 2021-07-29 VITALS — BP 122/82 | Ht 64.5 in | Wt 256.2 lb

## 2021-07-29 DIAGNOSIS — Z79899 Other long term (current) drug therapy: Secondary | ICD-10-CM

## 2021-07-29 DIAGNOSIS — M2550 Pain in unspecified joint: Secondary | ICD-10-CM | POA: Diagnosis not present

## 2021-07-29 DIAGNOSIS — E1142 Type 2 diabetes mellitus with diabetic polyneuropathy: Secondary | ICD-10-CM | POA: Diagnosis not present

## 2021-07-29 DIAGNOSIS — E119 Type 2 diabetes mellitus without complications: Secondary | ICD-10-CM

## 2021-07-29 DIAGNOSIS — E1169 Type 2 diabetes mellitus with other specified complication: Secondary | ICD-10-CM

## 2021-07-29 DIAGNOSIS — Z01419 Encounter for gynecological examination (general) (routine) without abnormal findings: Secondary | ICD-10-CM

## 2021-07-29 DIAGNOSIS — R5383 Other fatigue: Secondary | ICD-10-CM

## 2021-07-29 DIAGNOSIS — E785 Hyperlipidemia, unspecified: Secondary | ICD-10-CM

## 2021-07-29 MED ORDER — LANTUS SOLOSTAR 100 UNIT/ML ~~LOC~~ SOPN: PEN_INJECTOR | SUBCUTANEOUS | 1 refills | Status: DC

## 2021-07-29 MED ORDER — TIRZEPATIDE 5 MG/0.5ML ~~LOC~~ SOAJ: 5.0000 mg | SUBCUTANEOUS | 2 refills | Status: DC

## 2021-07-29 MED ORDER — ROSUVASTATIN CALCIUM 10 MG PO TABS: 10.0000 mg | ORAL_TABLET | Freq: Every day | ORAL | 0 refills | Status: DC

## 2021-07-29 NOTE — Patient Instructions (Addendum)
Finish [employer]. Use free coupon by the end of the month. Call in one month if you want to go up on dose.  Increase Crestor to 10 mg once a day. Repeat labs in 2 months.

## 2021-07-29 NOTE — Progress Notes (Signed)
Subjective:    Patient ID: Susan Becker, female    DOB: 01-29-1961, 60 y.o.   MRN: [id number]  HPI The patient comes in today for a wellness visit.    A review of their health history was completed.  A review of medications was also completed.  Any needed refills; no  Eating habits: trying to eat healthy  Falls/  MVA accidents in past few months: none  Regular exercise: trying to walk  Specialist pt sees on regular basis: Dr Garnet Koyanagi- diabetic specialist but has decided to come back to our office for diabetes care at this time  Preventative health issues were discussed.   Additional concerns: generalized joint pain and fatigue; no erythema or rash; significant swelling in both hands to the point she cannot wear her rings. No edema LE.  Regular vision and dental exams. Married, same sexual partner, female. No further vaginal bleeding since her workup with GYN.  Currently on Lantus units, would like to increase to 26 units to get her sugars under better control. Does not wear a CBG. Does finger sticks.    Review of Systems  Constitutional:  Positive for fatigue. Negative for activity change, appetite change and fever.  HENT:  Negative for sore throat and trouble swallowing.   Respiratory:  Negative for cough, chest tightness, shortness of breath and wheezing.   Cardiovascular:  Negative for chest pain and leg swelling.       No orthopnea.  Gastrointestinal:  Positive for constipation. Negative for abdominal distention, abdominal pain, blood in stool, diarrhea, nausea and vomiting.       Mild constipation well controlled.   Genitourinary:  Negative for difficulty urinating, dysuria, enuresis, frequency, genital sores, menstrual problem, pelvic pain, urgency, vaginal bleeding and vaginal discharge.  Musculoskeletal:  Positive for arthralgias and joint swelling.       Swelling in both hands.   Psychiatric/Behavioral:  Negative for sleep disturbance.   Depression screen PHQ 2/9  07/29/2021  Decreased Interest 0  Down, Depressed, Hopeless 0  PHQ - 2 Score 0  Altered sleeping -  Tired, decreased energy -  Change in appetite -  Feeling bad or failure about yourself  -  Trouble concentrating -  Moving slowly or fidgety/restless -  Suicidal thoughts -  PHQ-9 Score -  Difficult doing work/chores -        Objective:   Physical Exam Vitals and nursing note reviewed.  Constitutional:      General: She is not in acute distress.    Appearance: She is well-developed.  Neck:     Thyroid: No thyromegaly.     Trachea: No tracheal deviation.     Comments: Thyroid non tender to palpation. No mass or goiter noted.  Cardiovascular:     Rate and Rhythm: Normal rate and regular rhythm.     Heart sounds: Normal heart sounds. No murmur heard. Pulmonary:     Effort: Pulmonary effort is normal.     Breath sounds: Normal breath sounds.  Chest:  Breasts:    Right: No swelling, inverted nipple, mass, skin change or tenderness.     Left: No swelling, inverted nipple, mass, skin change or tenderness.  Abdominal:     General: There is no distension.     Palpations: Abdomen is soft.     Tenderness: There is no abdominal tenderness.  Genitourinary:    General: Normal vulva.     Vagina: Normal. No vaginal discharge.     Comments: External GU: no rashes  or lesions. Vagina: slightly pink and moist, no discharge or lesions. Cervix, normal in appearance. No CMT. Bimanual exam: no tenderness or or obvious masses; exam limited due to abdominal girth. Defers chaperone.  Musculoskeletal:        General: Swelling present.     Cervical back: Normal range of motion and neck supple.     Comments: Mild generalized edema of both hands and fingers. Warm with normal cap refill. Minimal heberden's nodes mainly right fingers (dominant hand).   Lymphadenopathy:     Cervical: No cervical adenopathy.     Upper Body:     Right upper body: No supraclavicular, axillary or pectoral adenopathy.      Left upper body: No supraclavicular, axillary or pectoral adenopathy.  Skin:    General: Skin is warm and dry.     Findings: No rash.  Neurological:     Mental Status: She is alert and oriented to person, place, and time.  Psychiatric:        Mood and Affect: Mood normal.        Behavior: Behavior normal.        Thought Content: Thought content normal.        Judgment: Judgment normal.   Today's Vitals   07/29/21 0946  BP: 122/82  Weight: 256 lb 3.2 oz (116.2 kg)  Height: 5' 4.5" (1.638 m)   Body mass index is 43.3 kg/m. Results for orders placed or performed in visit on 07/26/21  CBC with Differential  Result Value Ref Range   WBC 9.1 3.4 - 10.8 x10E3/uL   RBC 4.24 3.77 - 5.28 x10E6/uL   Hemoglobin 12.4 11.1 - 15.9 g/dL   Hematocrit 36.8 34.0 - 46.6 %   MCV 87 79 - 97 fL   MCH 29.2 26.6 - 33.0 pg   MCHC 33.7 31.5 - 35.7 g/dL   RDW 12.9 11.7 - 15.4 %   Platelets 319 150 - 450 x10E3/uL   Neutrophils 68 Not Estab. %   Lymphs 24 Not Estab. %   Monocytes 6 Not Estab. %   Eos 2 Not Estab. %   Basos 0 Not Estab. %   Neutrophils Absolute 6.1 1.4 - 7.0 x10E3/uL   Lymphocytes Absolute 2.2 0.7 - 3.1 x10E3/uL   Monocytes Absolute 0.5 0.1 - 0.9 x10E3/uL   EOS (ABSOLUTE) 0.2 0.0 - 0.4 x10E3/uL   Basophils Absolute 0.0 0.0 - 0.2 x10E3/uL   Immature Granulocytes 0 Not Estab. %   Immature Grans (Abs) 0.0 0.0 - 0.1 x10E3/uL  Lipid Profile  Result Value Ref Range   Cholesterol, Total 159 100 - 199 mg/dL   Triglycerides 200 (H) 0 - 149 mg/dL   HDL 44 >39 mg/dL   VLDL Cholesterol Cal 34 5 - 40 mg/dL   LDL Chol Calc (NIH) 81 0 - 99 mg/dL   Chol/HDL Ratio 3.6 0.0 - 4.4 ratio  Hepatic function panel  Result Value Ref Range   Total Protein 7.6 6.0 - 8.5 g/dL   Albumin 4.6 3.8 - 4.9 g/dL   Bilirubin Total 0.3 0.0 - 1.2 mg/dL   Bilirubin, Direct <0.10 0.00 - 0.40 mg/dL   Alkaline Phosphatase 66 44 - 121 IU/L   AST 15 0 - 40 IU/L   ALT 12 0 - 32 IU/L  Basic Metabolic Panel (BMET)   Result Value Ref Range   Glucose 155 (H) 70 - 99 mg/dL   BUN 11 6 - 24 mg/dL   Creatinine, Ser 0.67 0.57 - 1.00 mg/dL  eGFR 102 >59 mL/min/1.73   BUN/Creatinine Ratio 16 9 - 23   Sodium 141 134 - 144 mmol/L   Potassium 4.4 3.5 - 5.2 mmol/L   Chloride 102 96 - 106 mmol/L   CO2 24 20 - 29 mmol/L   Calcium 9.4 8.7 - 10.2 mg/dL   Reviewed labs with patient during visit.  Diabetic Foot Exam - Simple   Simple Foot Form Visual Inspection No deformities, no ulcerations, no other skin breakdown bilaterally: Yes Sensation Testing Intact to touch and monofilament testing bilaterally: Yes Pulse Check See comments: Yes Comments DP pulses present bilaterally; generalized decreased sensation in all of the toes with absent sensation of the small toe left foot (note patient has back pain and sciatica on the left side)          Assessment & Plan:   Problem List Items Addressed This Visit       Endocrine   Diabetic peripheral neuropathy (HCC)   Relevant Medications   tirzepatide (MOUNJARO) 5 MG/0.5ML Pen   rosuvastatin (CRESTOR) 10 MG tablet   insulin glargine (LANTUS SOLOSTAR) 100 UNIT/ML Solostar Pen   Other Relevant Orders   HgB A1c   Hyperlipidemia associated with type 2 diabetes mellitus (HCC)   Relevant Medications   tirzepatide (MOUNJARO) 5 MG/0.5ML Pen   rosuvastatin (CRESTOR) 10 MG tablet   insulin glargine (LANTUS SOLOSTAR) 100 UNIT/ML Solostar Pen   Other Relevant Orders   Lipid panel   Type 2 diabetes mellitus (HCC)   Relevant Medications   tirzepatide (MOUNJARO) 5 MG/0.5ML Pen   rosuvastatin (CRESTOR) 10 MG tablet   insulin glargine (LANTUS SOLOSTAR) 100 UNIT/ML Solostar Pen   Other Relevant Orders   HgB A1c   Other Visit Diagnoses     Well woman exam    -  Primary   Generalized joint pain       Relevant Orders   CYCLIC CITRUL PEPTIDE ANTIBODY, IGG/IGA   Rheumatoid Factor   ANA Direct w/Reflex if Positive   Fatigue, unspecified type       Relevant Orders    CYCLIC CITRUL PEPTIDE ANTIBODY, IGG/IGA   Rheumatoid Factor   ANA Direct w/Reflex if Positive   High risk medication use       Relevant Orders   Hepatic function panel      Meds ordered this encounter  Medications   tirzepatide (MOUNJARO) 5 MG/0.5ML Pen    Sig: Inject 5 mg into the skin once a week.    Dispense:  2 mL    Refill:  2    Order Specific Question:   Supervising Provider    Answer:   Sallee Lange A [[id number]]   rosuvastatin (CRESTOR) 10 MG tablet    Sig: Take 1 tablet (10 mg total) by mouth daily.    Dispense:  90 tablet    Refill:  0    Order Specific Question:   Supervising Provider    Answer:   Sallee Lange A [[id number]]   insulin glargine (LANTUS SOLOSTAR) 100 UNIT/ML Solostar Pen    Sig: Inject 26 units into the skin daily.    Dispense:  15 mL    Refill:  1    Order Specific Question:   Supervising Provider    Answer:   Sallee Lange A [[id number]]   Increase Crestor to 10 mg per day.  Complete ozempic then switch to [facility] to help with weight loss and A1c.  Increase Lantus to 26 units per day. Continue to monitor glucose levels and  decrease dose if needed.  Patient agrees with this plan.  Discussed importance of healthy diabetic diet and regular activity.  Repeat labs in 2 months to include screening for RA and SLE. Return in about 3 months (around 10/27/2021).

## 2021-08-01 ENCOUNTER — Other Ambulatory Visit: Payer: Self-pay | Admitting: Family Medicine

## 2021-08-25 ENCOUNTER — Other Ambulatory Visit: Payer: Self-pay | Admitting: Family Medicine

## 2021-09-24 ENCOUNTER — Other Ambulatory Visit: Payer: Self-pay | Admitting: Family Medicine

## 2021-09-26 ENCOUNTER — Encounter: Payer: Self-pay | Admitting: Nurse Practitioner

## 2021-09-26 ENCOUNTER — Ambulatory Visit (INDEPENDENT_AMBULATORY_CARE_PROVIDER_SITE_OTHER): Payer: PRIVATE HEALTH INSURANCE | Admitting: Nurse Practitioner

## 2021-09-26 ENCOUNTER — Other Ambulatory Visit: Payer: Self-pay

## 2021-09-26 VITALS — BP 108/71 | HR 77 | Temp 98.3°F | Wt 252.4 lb

## 2021-09-26 DIAGNOSIS — M79641 Pain in right hand: Secondary | ICD-10-CM | POA: Diagnosis not present

## 2021-09-26 MED ORDER — DICLOFENAC SODIUM 75 MG PO TBEC: 75.0000 mg | DELAYED_RELEASE_TABLET | Freq: Two times a day (BID) | ORAL | 0 refills | Status: DC

## 2021-09-26 NOTE — Progress Notes (Signed)
° °  Subjective:    Patient ID: Susan Becker, female    DOB: 1961/02/06, 61 y.o.   MRN: [id number]  HPI  Patient here for right hand pain x4 to 5 days .patient denies any injury to her right hand.  Pt states hand feels swollen, keeps her up at night, throbs and tip of thumb is numb and goes into pointer finger.  Patient states that she has been typing on the computer more often than normal at work.  Pt has tried Ibuprofen, Aleve, Aleve Cream, Voltaren Cream with little to Hello no relief.  Patient denies any weakness to her right hand.    Review of Systems  Musculoskeletal:        Right hand pain  All other systems reviewed and are negative.     Objective:   Physical Exam Constitutional:      General: She is not in acute distress.    Appearance: Normal appearance. She is normal weight. She is not ill-appearing or toxic-appearing.  Cardiovascular:     Rate and Rhythm: Normal rate and regular rhythm.     Pulses: Normal pulses.     Heart sounds: Normal heart sounds. No murmur heard. Pulmonary:     Effort: Pulmonary effort is normal. No respiratory distress.     Breath sounds: No wheezing.  Musculoskeletal:        General: Swelling and tenderness present. No deformity or signs of injury. Normal range of motion.     Right hand: Swelling and tenderness present. No deformity, lacerations or bony tenderness. Normal range of motion. Normal strength. Normal sensation. There is no disruption of two-point discrimination. Normal capillary refill. Normal pulse.     Left hand: Normal.     Comments: Tenderness noted to right thumb abduction with resistance.  Tenderness noted to right wrist during extension.  Skin:    General: Skin is warm.     Capillary Refill: Capillary refill takes less than 2 seconds.  Neurological:     General: No focal deficit present.     Mental Status: She is alert and oriented to person, place, and time.  Psychiatric:        Mood and Affect: Mood normal.         Behavior: Behavior normal.          Assessment & Plan:  1. Pain of right hand -Possible tendinitis versus carpal tunnel versus neuropathy - diclofenac (VOLTAREN) 75 MG EC tablet; Take 1 tablet (75 mg total) by mouth 2 (two) times daily.  Dispense: 30 tablet; Refill: 0 -Wear brace at night or as needed for comfort -Prescription for right hand brace provided to patient -Limit typing with right hand as much as possible -Return to clinic if pain not better or worse -We will consider referral to Ortho if pain is not better with rest, Voltaren, and hand brace

## 2021-09-27 ENCOUNTER — Encounter: Payer: Self-pay | Admitting: *Deleted

## 2021-10-21 ENCOUNTER — Other Ambulatory Visit: Payer: Self-pay | Admitting: Nurse Practitioner

## 2021-10-24 ENCOUNTER — Other Ambulatory Visit: Payer: Self-pay | Admitting: Nurse Practitioner

## 2021-10-27 ENCOUNTER — Other Ambulatory Visit: Payer: Self-pay | Admitting: Nurse Practitioner

## 2021-10-27 LAB — RHEUMATOID FACTOR: Rheumatoid fact SerPl-aCnc: <10 IU/mL (ref <14.0)

## 2021-10-27 LAB — LIPID PANEL
Chol/HDL Ratio: 3.6 ratio (ref 0.0–4.4)
Cholesterol, Total: 168 mg/dL (ref 100–199)
HDL: 47 mg/dL (ref >39)
LDL Chol Calc (NIH): 89 mg/dL (ref 0–99)
Triglycerides: 191 mg/dL — ABNORMAL HIGH (ref 0–149)
VLDL Cholesterol Cal: 32 mg/dL (ref 5–40)

## 2021-10-27 LAB — HEPATIC FUNCTION PANEL
ALT: 10 IU/L (ref 0–32)
AST: 10 IU/L (ref 0–40)
Albumin: 4.3 g/dL (ref 3.8–4.9)
Alkaline Phosphatase: 72 IU/L (ref 44–121)
Bilirubin Total: 0.2 mg/dL (ref 0.0–1.2)
Bilirubin, Direct: <0.1 mg/dL (ref 0.00–0.40)
Total Protein: 7.2 g/dL (ref 6.0–8.5)

## 2021-10-27 LAB — HEMOGLOBIN A1C
Est. average glucose Bld gHb Est-mCnc: 143 mg/dL
Hgb A1c MFr Bld: 6.6 % — ABNORMAL HIGH (ref 4.8–5.6)

## 2021-10-27 LAB — ANA W/REFLEX IF POSITIVE: Anti Nuclear Antibody (ANA): NEGATIVE

## 2021-10-27 LAB — CYCLIC CITRUL PEPTIDE ANTIBODY, IGG/IGA: Cyclic Citrullin Peptide Ab: <1 units (ref 0–19)

## 2021-10-28 ENCOUNTER — Ambulatory Visit (INDEPENDENT_AMBULATORY_CARE_PROVIDER_SITE_OTHER): Payer: PRIVATE HEALTH INSURANCE | Admitting: Nurse Practitioner

## 2021-10-28 ENCOUNTER — Other Ambulatory Visit: Payer: Self-pay

## 2021-10-28 VITALS — BP 128/78 | HR 84 | Temp 97.3°F | Ht 64.5 in | Wt 251.0 lb

## 2021-10-28 DIAGNOSIS — Z9989 Dependence on other enabling machines and devices: Secondary | ICD-10-CM

## 2021-10-28 DIAGNOSIS — E1142 Type 2 diabetes mellitus with diabetic polyneuropathy: Secondary | ICD-10-CM

## 2021-10-28 DIAGNOSIS — E119 Type 2 diabetes mellitus without complications: Secondary | ICD-10-CM | POA: Diagnosis not present

## 2021-10-28 DIAGNOSIS — I10 Essential (primary) hypertension: Secondary | ICD-10-CM

## 2021-10-28 DIAGNOSIS — J069 Acute upper respiratory infection, unspecified: Secondary | ICD-10-CM

## 2021-10-28 DIAGNOSIS — G4733 Obstructive sleep apnea (adult) (pediatric): Secondary | ICD-10-CM

## 2021-10-28 DIAGNOSIS — Z794 Long term (current) use of insulin: Secondary | ICD-10-CM

## 2021-10-28 MED ORDER — TIRZEPATIDE 7.5 MG/0.5ML ~~LOC~~ SOAJ: 7.5000 mg | SUBCUTANEOUS | 2 refills | Status: DC

## 2021-10-28 NOTE — Progress Notes (Signed)
? ?Subjective:  ? ? Patient ID: Susan Becker, female    DOB: May 06, 1961, 60 y.o.   MRN: [id number] ? ?HPI ?She presents for diabetic follow-up. She has type 2 diabetes mellitus.  ?Taking 26 units of Lantus daily. Fasting blood sugars are mostly under 200. Mainly high in the morning.   Denies any polydipsia polyphagia.  No chest pain or shortness of breath. Reports pantoprazole is controlling her acid reflux symptoms. Wearing CPAP on a nightly basis. Adherent to medication regimen. Doing well with mounjaro and would like to increase dosage.  ?Cold symptoms began 3 days ago with lightheadedness, denies fever. Frequent cough producing yellowish/greenish mucus at times.  No wheezing, CP or SOB. No sore throat.  Some right ear pain.  Has not tried anything for symptoms. Non-smoker. Taking fluids well. Voiding normal limit. Experiencing some numbness in the 4th and 5th digit toes, previously seen a specialist related sciatica.   ? ?Review of Systems  ?Constitutional:  Negative for appetite change, chills and fever.  ?HENT:  Positive for congestion, postnasal drip and sinus pressure. Negative for sneezing, sore throat and trouble swallowing.   ?Eyes:  Negative for visual disturbance.  ?Respiratory:  Positive for cough. Negative for chest tightness, shortness of breath and wheezing.   ?Cardiovascular:  Negative for chest pain.  ?Gastrointestinal:  Negative for constipation, diarrhea, nausea and vomiting.  ?Endocrine: Negative for polydipsia, polyphagia and polyuria.  ?Neurological:  Positive for light-headedness. Negative for dizziness, syncope, weakness and headaches.  ? ?   ?Objective:  ? Physical Exam ?Constitutional:   ?   General: She is not in acute distress. ?   Appearance: Normal appearance.  ?HENT:  ?   Head:  ?   Comments:  Right TM mild clear effusion. ?   Right Ear: External ear normal.  ?   Left Ear: Tympanic membrane and external ear normal.  ?   Nose: No congestion.  ?   Mouth/Throat:  ?   Mouth: Mucous  membranes are moist.  ?   Pharynx: No oropharyngeal exudate or posterior oropharyngeal erythema.  ?   Comments: Posterior pharynx mildly injected with PND noted.  ?Neck:  ?   Comments:  Thyroid non tender to palpation. No mass or goiter noted. Mild anterior cervical adenopathy.  ?Cardiovascular:  ?   Rate and Rhythm: Normal rate and regular rhythm.  ?   Pulses: Normal pulses.  ?   Heart sounds: Normal heart sounds.  ?Pulmonary:  ?   Effort: Pulmonary effort is normal. No respiratory distress.  ?   Breath sounds: Normal breath sounds. No wheezing.  ?Abdominal:  ?   General: There is no distension.  ?   Palpations: Abdomen is soft. There is no mass.  ?   Tenderness: There is no abdominal tenderness.  ?   Comments: Exam limited due to abd girth.  ?Musculoskeletal:  ?   Cervical back: No tenderness.  ?Skin: ?   General: Skin is dry.  ?Neurological:  ?   Mental Status: She is alert and oriented to person, place, and time. Mental status is at baseline.  ?Psychiatric:     ?   Mood and Affect: Mood normal.     ?   Behavior: Behavior normal.     ?   Thought Content: Thought content normal.     ?   Judgment: Judgment normal.  ? ? ?Today's Vitals  ? 10/28/21 0915  ?BP: 128/78  ?Pulse: 84  ?Temp: (!) 97.3 ?F (36.3 ?C)  ?  SpO2: 96%  ?Weight: 251 lb (113.9 kg)  ?Height: 5' 4.5" (1.638 m)  ? ?Body mass index is 42.42 kg/m?.  ? ?Recent Results (from the past 2160 hour(s))  ?CYCLIC CITRUL PEPTIDE ANTIBODY, IGG/IGA     Status: None  ? Collection Time: 10/25/21  8:30 AM  ?Result Value Ref Range  ? Cyclic Citrullin Peptide Ab <1 0 - 19 units  ?  Comment:                           Negative               <20 ?                          Weak positive      20 - 39 ?                          Moderate positive  40 - 59 ?                          Strong positive        >59 ?  ?Rheumatoid Factor     Status: None  ? Collection Time: 10/25/21  8:30 AM  ?Result Value Ref Range  ? Rhuematoid fact SerPl-aCnc <10.0 <14.0 IU/mL  ?HgB A1c     Status:  Abnormal  ? Collection Time: 10/25/21  8:30 AM  ?Result Value Ref Range  ? Hgb A1c MFr Bld 6.6 (H) 4.8 - 5.6 %  ?  Comment:          Prediabetes: 5.7 - 6.4 ?         Diabetes: >6.4 ?         Glycemic control for adults with diabetes: <7.0 ?  ? Est. average glucose Bld gHb Est-mCnc 143 mg/dL  ?Hepatic function panel     Status: None  ? Collection Time: 10/25/21  8:30 AM  ?Result Value Ref Range  ? Total Protein 7.2 6.0 - 8.5 g/dL  ? Albumin 4.3 3.8 - 4.9 g/dL  ? Bilirubin Total 0.2 0.0 - 1.2 mg/dL  ? Bilirubin, Direct <0.10 0.00 - 0.40 mg/dL  ? Alkaline Phosphatase 72 44 - 121 IU/L  ? AST 10 0 - 40 IU/L  ? ALT 10 0 - 32 IU/L  ?Lipid panel     Status: Abnormal  ? Collection Time: 10/25/21  8:30 AM  ?Result Value Ref Range  ? Cholesterol, Total 168 100 - 199 mg/dL  ? Triglycerides 191 (H) 0 - 149 mg/dL  ? HDL 47 >39 mg/dL  ? VLDL Cholesterol Cal 32 5 - 40 mg/dL  ? LDL Chol Calc (NIH) 89 0 - 99 mg/dL  ? Chol/HDL Ratio 3.6 0.0 - 4.4 ratio  ?  Comment:                                   T. Chol/HDL Ratio ?                                            Men  Women ?  1/2 Avg.Risk  3.4    3.3 ?                                  Avg.Risk  5.0    4.4 ?                               2X Avg.Risk  9.6    7.1 ?                               3X Avg.Risk 23.4   11.0 ?  ?ANA Direct w/Reflex if Positive     Status: None  ? Collection Time: 10/25/21  8:30 AM  ?Result Value Ref Range  ? Anti Nuclear Antibody (ANA) Negative Negative  ?Reviewed labs with patient. ? ? ?  ?Assessment & Plan:  ? ?Problem List Items Addressed This Visit   ? ?  ? Cardiovascular and Mediastinum  ? Essential hypertension, benign  ?  ? Respiratory  ? Obstructive sleep apnea treated with continuous positive airway pressure (CPAP)  ?  ? Endocrine  ? Diabetic peripheral neuropathy ([place])  ? Relevant Medications  ? tirzepatide (MOUNJARO) 7.5 MG/0.5ML Pen  ? Type 2 diabetes mellitus ([place]) - Primary  ? Relevant Medications  ? tirzepatide  (MOUNJARO) 7.5 MG/0.5ML Pen  ? ?Other Visit Diagnoses   ? ? Viral URI with cough      ? ?  ? ?Given samples of Mucinex DM. Warning signs reviewed. Call back if symptoms worsen or persist.  ?Defers referral to rheumatologist or neurosurgeon at this time. ?Continue healthy habits including activity and healthy diet.  ?Continue to monitor blood sugar and bring results to next visit. ?Continue wearing nightly CPAP. ?Increase to next dose of Mounjaro. Stop medication and call if any problems.  ? ? ?Meds ordered this encounter  ?Medications  ? tirzepatide (MOUNJARO) 7.5 MG/0.5ML Pen  ?  Sig: Inject 7.5 mg into the skin once a week.  ?  Dispense:  2 mL  ?  Refill:  2  ?  Order Specific Question:   Supervising Provider  ?  Answer:   Sallee Lange A [[id number]]  ?  ?Return in about 3 months (around 01/28/2022). ?Call back sooner if needed.   ?

## 2021-10-28 NOTE — Progress Notes (Signed)
? ?  Subjective:  ? ? Patient ID: Susan Becker, female    DOB: 10-27-1960, 61 y.o.   MRN: [id number] ? ?Diabetes ?She presents for her follow-up diabetic visit. She has type 2 diabetes mellitus. Current diabetic treatments: metformin, lantus, mounjaro.  ? ? ? ?Review of Systems ? ?   ?Objective:  ? Physical Exam ? ? ? ? ?   ?Assessment & Plan:  ? ? ?

## 2021-10-29 ENCOUNTER — Encounter: Payer: Self-pay | Admitting: Nurse Practitioner

## 2021-11-23 ENCOUNTER — Other Ambulatory Visit: Payer: Self-pay | Admitting: Family Medicine

## 2021-11-24 ENCOUNTER — Other Ambulatory Visit: Payer: Self-pay | Admitting: *Deleted

## 2021-11-24 MED ORDER — METFORMIN HCL 1000 MG PO TABS: 1000.0000 mg | ORAL_TABLET | Freq: Two times a day (BID) | ORAL | 0 refills | Status: DC

## 2021-11-28 ENCOUNTER — Other Ambulatory Visit: Payer: Self-pay

## 2021-11-28 MED ORDER — METFORMIN HCL 1000 MG PO TABS: 1000.0000 mg | ORAL_TABLET | Freq: Two times a day (BID) | ORAL | 0 refills | Status: DC

## 2021-12-16 ENCOUNTER — Other Ambulatory Visit: Payer: Self-pay | Admitting: *Deleted

## 2021-12-16 MED ORDER — PEN NEEDLES 31G X 5 MM MISC: 0 refills | Status: AC

## 2022-01-01 IMAGING — MG DIGITAL SCREENING BILAT W/ TOMO W/ CAD
6 of 10 series · 6 of 30 positions shown · non-contrast
Comparison: Previous exam(s).

CLINICAL DATA: Screening.

EXAM:
DIGITAL SCREENING BILATERAL MAMMOGRAM WITH TOMO AND CAD

[L CC synth-2D]
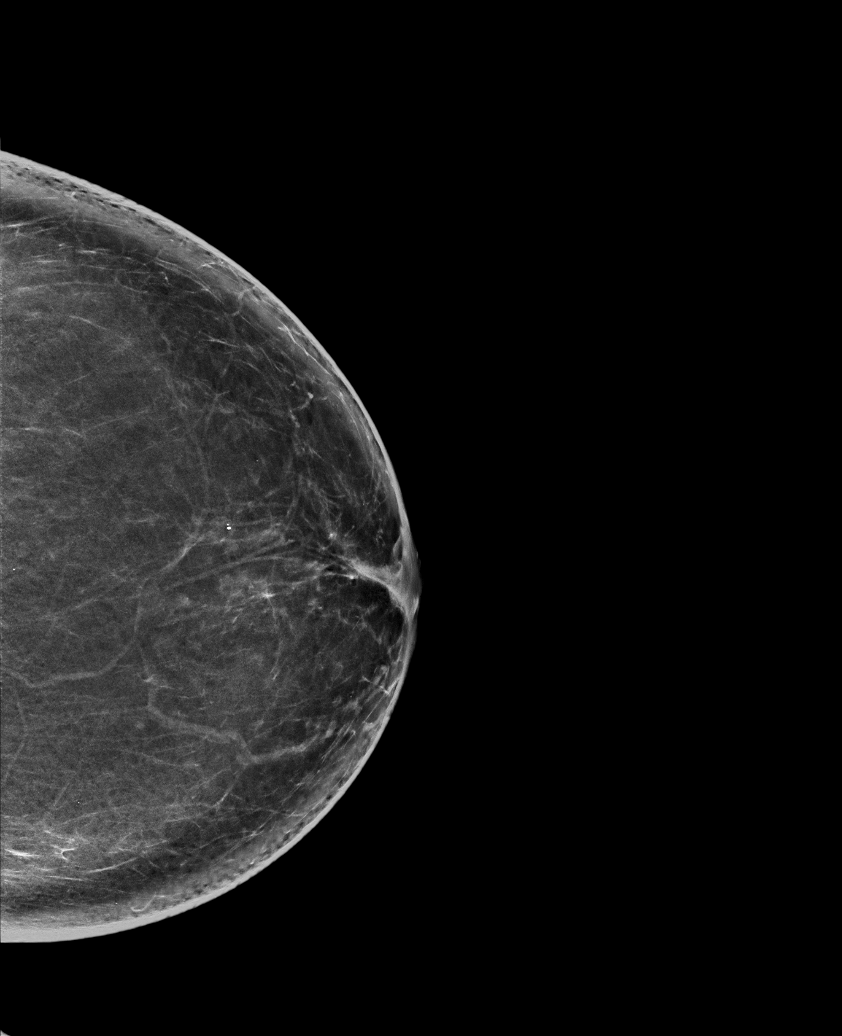

[L MLO synth-2D (1 of 2)]
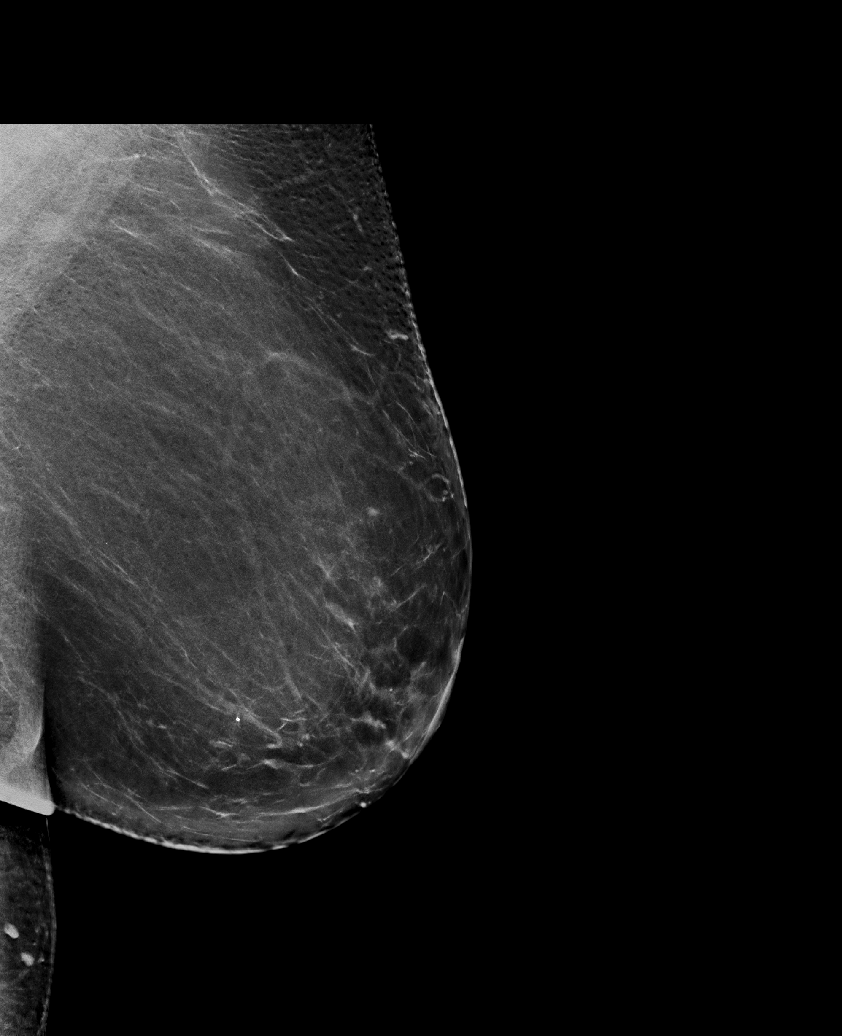

[L MLO synth-2D (2 of 2)]
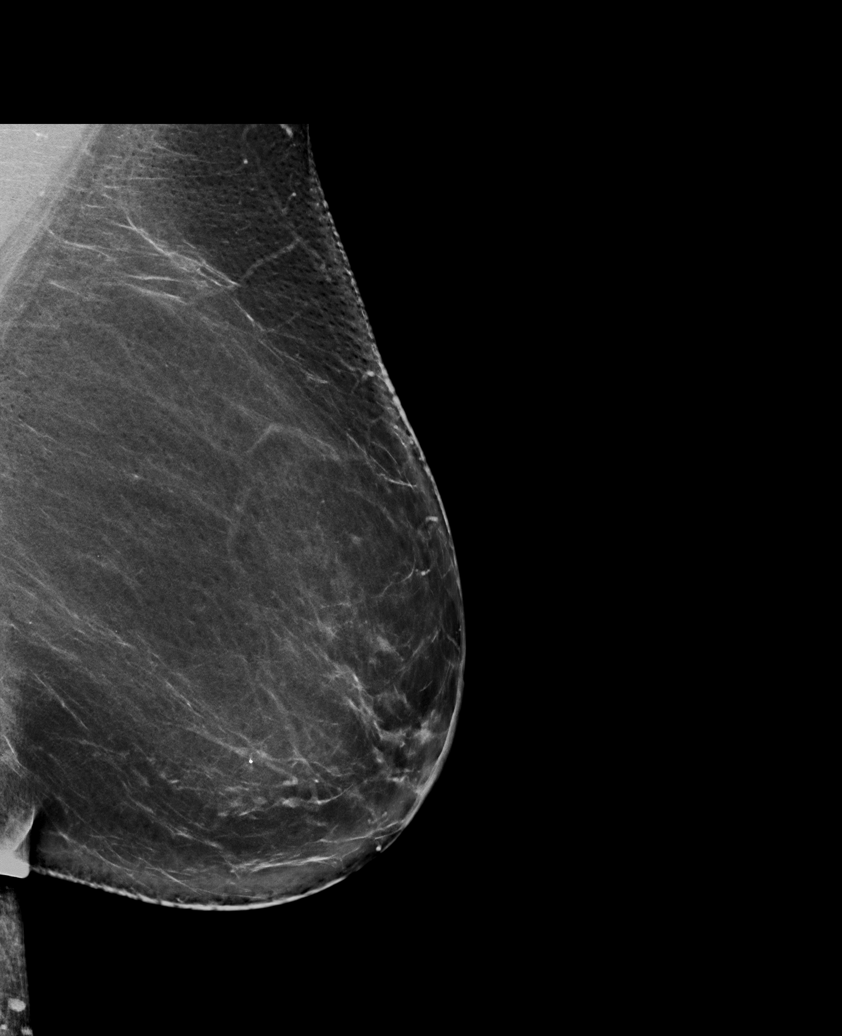

[R MLO synth-2D]
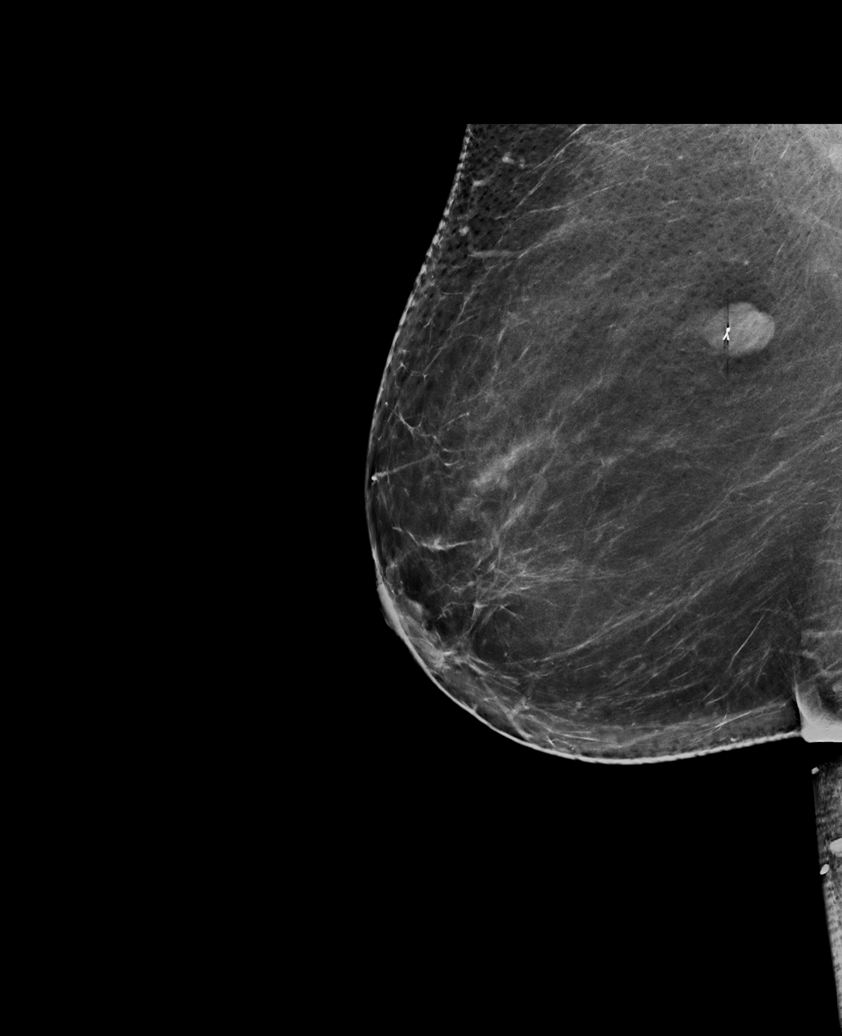

[R CC synth-2D]
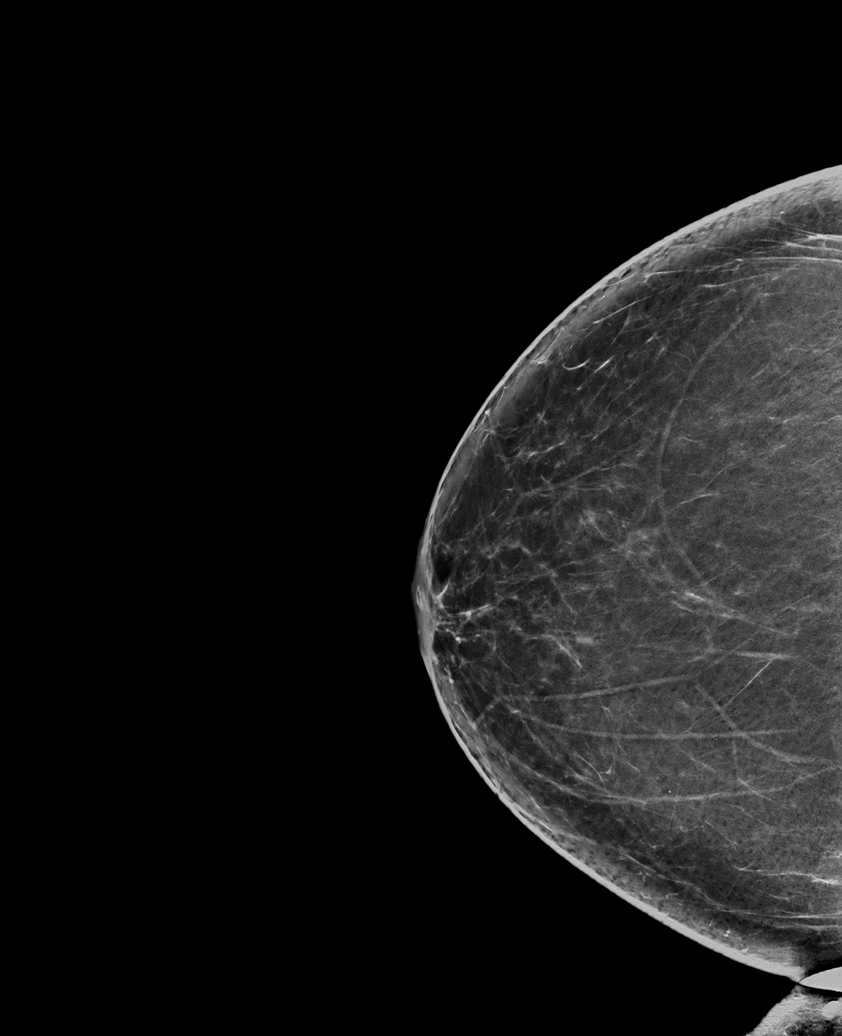

[R CC tomo · tomo slice 41/82.0]
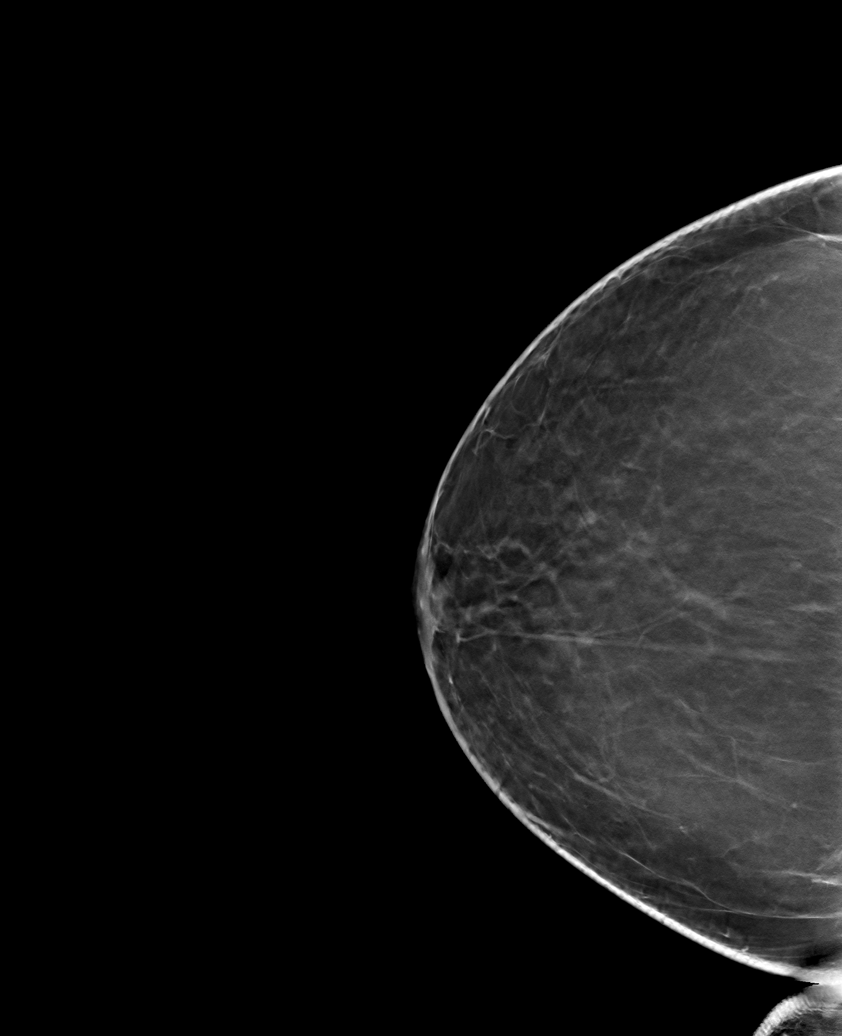

[6 of 30 positions shown; findings below may reference images not displayed]

ACR Breast Density Category b: There are scattered areas of
fibroglandular density.
FINDINGS: There are no findings suspicious for malignancy. Images were
processed with CAD.
IMPRESSION: No mammographic evidence of malignancy. A result letter of this
screening mammogram will be mailed directly to the patient.

RECOMMENDATION:
Screening mammogram in one year. (Code:[id number]-775)

BI-RADS CATEGORY  1: Negative.

## 2022-01-22 ENCOUNTER — Other Ambulatory Visit: Payer: Self-pay | Admitting: Family Medicine

## 2022-02-03 ENCOUNTER — Ambulatory Visit (INDEPENDENT_AMBULATORY_CARE_PROVIDER_SITE_OTHER): Payer: PRIVATE HEALTH INSURANCE | Admitting: Nurse Practitioner

## 2022-02-03 VITALS — BP 149/75 | Temp 97.3°F | Ht 64.5 in | Wt 249.0 lb

## 2022-02-03 DIAGNOSIS — G4733 Obstructive sleep apnea (adult) (pediatric): Secondary | ICD-10-CM

## 2022-02-03 DIAGNOSIS — E119 Type 2 diabetes mellitus without complications: Secondary | ICD-10-CM

## 2022-02-03 DIAGNOSIS — K219 Gastro-esophageal reflux disease without esophagitis: Secondary | ICD-10-CM | POA: Diagnosis not present

## 2022-02-03 DIAGNOSIS — I10 Essential (primary) hypertension: Secondary | ICD-10-CM

## 2022-02-03 DIAGNOSIS — F419 Anxiety disorder, unspecified: Secondary | ICD-10-CM

## 2022-02-03 DIAGNOSIS — Z794 Long term (current) use of insulin: Secondary | ICD-10-CM

## 2022-02-03 DIAGNOSIS — Z9989 Dependence on other enabling machines and devices: Secondary | ICD-10-CM

## 2022-02-03 NOTE — Progress Notes (Unsigned)
   Subjective:    Patient ID: Susan Becker, female    DOB: 1961/07/05, 61 y.o.   MRN: [id number]  Diabetes She presents for her follow-up diabetic visit. She has type 2 diabetes mellitus. Pertinent negatives for diabetes include no chest pain. Current diabetic treatments: metformin, mounjaro, lantus.  Hypertension This is a chronic problem. Pertinent negatives include no chest pain or shortness of breath. Treatments tried: losartan.  Adherent to medication regimen.  Doing well on Lexapro for her anxiety.  Doing well on Mounjaro with minimal nausea or side effects.  Usually the first day of injection.  Eating a much healthier diet.  Regular walking program.  Has been wearing her CPAP every night.  GERD has been stable, takes occasional Protonix, not every day.  Glucose or BP log not available during visit.      Review of Systems  HENT:  Negative for sore throat and trouble swallowing.   Respiratory:  Negative for cough, shortness of breath and wheezing.   Cardiovascular:  Negative for chest pain.  Gastrointestinal:  Positive for nausea. Negative for abdominal pain, blood in stool and vomiting.       Nausea only on the first day after starting Mounjaro weekly.  Genitourinary:  Negative for vaginal bleeding.       Objective:   Physical Exam NAD.  Alert, oriented.  Calm cheerful affect.  Thyroid nontender to palpation, no mass or goiter noted.  Lungs clear.  Heart regular rate rhythm.  Abdomen soft nondistended nontender.  Today's Vitals   02/03/22 0856  BP: (!) 149/75  Temp: (!) 97.3 F (36.3 C)  SpO2: 98%  Weight: 249 lb (112.9 kg)  Height: 5' 4.5" (1.638 m)   Body mass index is 42.08 kg/m.     Assessment & Plan:   Problem List Items Addressed This Visit       Cardiovascular and Mediastinum   Essential hypertension, benign - Primary     Respiratory   Obstructive sleep apnea treated with continuous positive airway pressure (CPAP)     Digestive   Gastroesophageal  reflux disease without esophagitis     Endocrine   Type 2 diabetes mellitus ([place])     Other   Anxiety   Morbid obesity ([place])    Continue current medications as directed. Continue CPAP as directed. Patient to continue current dose of Mounjaro 7.5 mg over the next few weeks since she is just gotten this filled.  If she wishes to go up to the next dose contact the office for her next refill. Continue healthy diet, regular walking and weight loss efforts. Recommend shingles vaccine at local pharmacy. Return in about 3 months (around 05/06/2022). Repeat lab work at that time.

## 2022-02-04 ENCOUNTER — Encounter: Payer: Self-pay | Admitting: Nurse Practitioner

## 2022-03-05 ENCOUNTER — Encounter: Payer: Self-pay | Admitting: Nurse Practitioner

## 2022-03-05 DIAGNOSIS — Z79899 Other long term (current) drug therapy: Secondary | ICD-10-CM

## 2022-03-05 DIAGNOSIS — I10 Essential (primary) hypertension: Secondary | ICD-10-CM

## 2022-03-05 DIAGNOSIS — E1169 Type 2 diabetes mellitus with other specified complication: Secondary | ICD-10-CM

## 2022-03-05 DIAGNOSIS — Z794 Long term (current) use of insulin: Secondary | ICD-10-CM

## 2022-03-06 ENCOUNTER — Other Ambulatory Visit: Payer: Self-pay | Admitting: Nurse Practitioner

## 2022-03-06 MED ORDER — TIRZEPATIDE 10 MG/0.5ML ~~LOC~~ SOAJ: SUBCUTANEOUS | 3 refills | Status: DC

## 2022-03-06 NOTE — Telephone Encounter (Signed)
Nurses Next step-Mounjaro 10 mg subcutaneous once weekly May send in 1 month supply with 3 additional refills Recommend lab work before her September visit to be lipid, liver, metabolic 7, and [id number] lab work can be completed approximately 3 to 7 days before her follow-up visit with Hoyle Sauer thank you  If any problems with the increased dose Langley Gauss is to let us know.  Thanks-Dr. Nicki Reaper

## 2022-03-10 ENCOUNTER — Other Ambulatory Visit: Payer: Self-pay | Admitting: Nurse Practitioner

## 2022-03-10 ENCOUNTER — Telehealth: Payer: Self-pay | Admitting: *Deleted

## 2022-03-10 MED ORDER — TIRZEPATIDE 7.5 MG/0.5ML ~~LOC~~ SOAJ
7.5000 mg | SUBCUTANEOUS | 2 refills | Status: DC
Start: 2022-03-10 — End: 2022-06-16

## 2022-03-10 NOTE — Telephone Encounter (Signed)
Done

## 2022-03-10 NOTE — Telephone Encounter (Signed)
Patient stated her increased dose of Mounjaro is on back order and she would like her previous dose (7.5)sent to [facility]

## 2022-04-06 ENCOUNTER — Other Ambulatory Visit: Payer: Self-pay | Admitting: Family Medicine

## 2022-04-13 ENCOUNTER — Other Ambulatory Visit: Payer: Self-pay | Admitting: *Deleted

## 2022-04-13 MED ORDER — METFORMIN HCL 1000 MG PO TABS: 1000.0000 mg | ORAL_TABLET | Freq: Two times a day (BID) | ORAL | 0 refills | Status: DC

## 2022-04-17 ENCOUNTER — Other Ambulatory Visit: Payer: Self-pay | Admitting: Family Medicine

## 2022-05-03 LAB — BASIC METABOLIC PANEL
BUN/Creatinine Ratio: 17 (ref 9–23)
BUN: 11 mg/dL (ref 6–24)
CO2: 21 mmol/L (ref 20–29)
Calcium: 9.7 mg/dL (ref 8.7–10.2)
Chloride: 102 mmol/L (ref 96–106)
Creatinine, Ser: 0.64 mg/dL (ref 0.57–1.00)
Glucose: 132 mg/dL — ABNORMAL HIGH (ref 70–99)
Potassium: 4.3 mmol/L (ref 3.5–5.2)
Sodium: 142 mmol/L (ref 134–144)
eGFR: 102 mL/min/{1.73_m2} (ref >59)

## 2022-05-03 LAB — HEMOGLOBIN A1C
Est. average glucose Bld gHb Est-mCnc: 143 mg/dL
Hgb A1c MFr Bld: 6.6 % — ABNORMAL HIGH (ref 4.8–5.6)

## 2022-05-03 LAB — HEPATIC FUNCTION PANEL
ALT: 12 IU/L (ref 0–32)
AST: 16 IU/L (ref 0–40)
Albumin: 4.3 g/dL (ref 3.8–4.9)
Alkaline Phosphatase: 76 IU/L (ref 44–121)
Bilirubin Total: 0.4 mg/dL (ref 0.0–1.2)
Bilirubin, Direct: <0.1 mg/dL (ref 0.00–0.40)
Total Protein: 7.2 g/dL (ref 6.0–8.5)

## 2022-05-03 LAB — LIPID PANEL
Chol/HDL Ratio: 3.7 ratio (ref 0.0–4.4)
Cholesterol, Total: 157 mg/dL (ref 100–199)
HDL: 42 mg/dL (ref >39)
LDL Chol Calc (NIH): 65 mg/dL (ref 0–99)
Triglycerides: 321 mg/dL — ABNORMAL HIGH (ref 0–149)
VLDL Cholesterol Cal: 50 mg/dL — ABNORMAL HIGH (ref 5–40)

## 2022-05-04 ENCOUNTER — Other Ambulatory Visit: Payer: Self-pay | Admitting: Family Medicine

## 2022-05-04 ENCOUNTER — Other Ambulatory Visit: Payer: Self-pay

## 2022-05-04 DIAGNOSIS — E785 Hyperlipidemia, unspecified: Secondary | ICD-10-CM

## 2022-05-04 MED ORDER — ROSUVASTATIN CALCIUM 10 MG PO TABS: ORAL_TABLET | ORAL | 0 refills | Status: DC

## 2022-05-05 ENCOUNTER — Ambulatory Visit: Payer: PRIVATE HEALTH INSURANCE | Admitting: Nurse Practitioner

## 2022-05-05 ENCOUNTER — Other Ambulatory Visit (HOSPITAL_COMMUNITY): Payer: Self-pay | Admitting: Family Medicine

## 2022-05-05 ENCOUNTER — Encounter: Payer: Self-pay | Admitting: Nurse Practitioner

## 2022-05-05 VITALS — BP 138/82 | Ht 64.5 in | Wt 250.0 lb

## 2022-05-05 DIAGNOSIS — F419 Anxiety disorder, unspecified: Secondary | ICD-10-CM

## 2022-05-05 DIAGNOSIS — Z794 Long term (current) use of insulin: Secondary | ICD-10-CM

## 2022-05-05 DIAGNOSIS — I10 Essential (primary) hypertension: Secondary | ICD-10-CM

## 2022-05-05 DIAGNOSIS — E119 Type 2 diabetes mellitus without complications: Secondary | ICD-10-CM | POA: Diagnosis not present

## 2022-05-05 DIAGNOSIS — Z79899 Other long term (current) drug therapy: Secondary | ICD-10-CM | POA: Diagnosis not present

## 2022-05-05 DIAGNOSIS — E785 Hyperlipidemia, unspecified: Secondary | ICD-10-CM

## 2022-05-05 DIAGNOSIS — E1169 Type 2 diabetes mellitus with other specified complication: Secondary | ICD-10-CM | POA: Diagnosis not present

## 2022-05-05 DIAGNOSIS — Z1231 Encounter for screening mammogram for malignant neoplasm of breast: Secondary | ICD-10-CM

## 2022-05-05 MED ORDER — ROSUVASTATIN CALCIUM 20 MG PO TABS: 20.0000 mg | ORAL_TABLET | Freq: Every day | ORAL | 0 refills | Status: DC

## 2022-05-05 MED ORDER — ESCITALOPRAM OXALATE 20 MG PO TABS: ORAL_TABLET | ORAL | 0 refills | Status: DC

## 2022-05-05 MED ORDER — LOSARTAN POTASSIUM 100 MG PO TABS: ORAL_TABLET | ORAL | 0 refills | Status: DC

## 2022-05-05 MED ORDER — EMPAGLIFLOZIN 10 MG PO TABS: 10.0000 mg | ORAL_TABLET | Freq: Every day | ORAL | 0 refills | Status: DC

## 2022-05-05 NOTE — Progress Notes (Deleted)
   Acute Office Visit  Subjective:     Patient ID: Susan Becker, female    DOB: 1960-09-20, 61 y.o.   MRN: [id number]  Chief Complaint  Patient presents with   Diabetes    Follow up    HPI Patient is in today for ***  ROS      Objective:    BP 138/82   Ht 5' 4.5" (1.638 m)   Wt 113.4 kg   LMP 06/21/2013   BMI 42.25 kg/m  {Vitals History (Optional):23777}  Physical Exam  No results found for any visits on 05/05/22.      Assessment & Plan:   Problem List Items Addressed This Visit       Cardiovascular and Mediastinum   Essential hypertension, benign - Primary     Endocrine   Type 2 diabetes mellitus ([place])   Hyperlipidemia associated with type 2 diabetes mellitus ([place])   Other Visit Diagnoses     High risk medication use           No orders of the defined types were placed in this encounter.   Return in about 3 months (around 08/04/2022) for physical.  Edd Fabian, RN

## 2022-05-05 NOTE — Progress Notes (Unsigned)
Subjective:    Patient ID: Susan Becker, female    DOB: Jul 02, 1961, 61 y.o.   MRN: [id number]  Diabetes She presents for her follow-up diabetic visit. She has type 2 diabetes mellitus. Pertinent negatives for hypoglycemia include no dizziness or nervousness/anxiousness. Pertinent negatives for diabetes include no chest pain, no fatigue, no polydipsia, no polyphagia, no polyuria and no weakness. Risk factors for coronary artery disease include diabetes mellitus and hypertension.  Discuss recent lab work   HPI:  Med maintenance unchanged. Wants to increase Mounjaro but dosage unavailable. Reports weight loss successful when intermittent fasting, but has gained back after vacation. Plans to walk when weather cools and resume diet. Daily FSBS 130-140. Defers Shingrix due to SE. Eye exam scheduled for January '24. Reports no visual changes. Denies respiratory changes or CVD issues.    Review of Systems  Constitutional:  Negative for activity change, appetite change, fatigue and unexpected weight change.  Eyes:  Negative for visual disturbance.  Respiratory:  Negative for chest tightness and shortness of breath.   Cardiovascular:  Negative for chest pain and leg swelling.  Endocrine: Negative for cold intolerance, heat intolerance, polydipsia, polyphagia and polyuria.  Neurological:  Negative for dizziness and weakness.  Psychiatric/Behavioral:  Negative for sleep disturbance. The patient is not nervous/anxious.         Objective:   Vitals:   05/05/22 0856  BP: 138/82  Height: 5' 4.5" (1.638 m)  Weight: 113.4 kg  BMI (Calculated): 42.27      Physical Exam Constitutional:      Appearance: Normal appearance. She is obese.  Cardiovascular:     Rate and Rhythm: Normal rate and regular rhythm.     Pulses: Normal pulses.     Heart sounds: Normal heart sounds.  Pulmonary:     Effort: Pulmonary effort is normal.     Breath sounds: Normal breath sounds. No wheezing.  Musculoskeletal:         General: No swelling.     Right lower leg: No edema.     Left lower leg: No edema.  Skin:    General: Skin is warm and dry.  Neurological:     Mental Status: She is alert and oriented to person, place, and time. Mental status is at baseline.     Motor: No weakness.  Psychiatric:        Mood and Affect: Mood normal.   Diabetic Foot Exam - Simple   Simple Foot Form  05/05/2022  9:40 AM  Visual Inspection No deformities, no ulcerations, no other skin breakdown bilaterally: Yes Sensation Testing Intact to touch and monofilament testing bilaterally: Yes Pulse Check Posterior Tibialis and Dorsalis pulse intact bilaterally: Yes Comments Fifth toe left toe chronic decreased sensation due to sciatica     Results for orders placed or performed in visit on 03/05/22  Lipid panel  Result Value Ref Range   Cholesterol, Total 157 100 - 199 mg/dL   Triglycerides 321 (H) 0 - 149 mg/dL   HDL 42 >39 mg/dL   VLDL Cholesterol Cal 50 (H) 5 - 40 mg/dL   LDL Chol Calc (NIH) 65 0 - 99 mg/dL   Chol/HDL Ratio 3.7 0.0 - 4.4 ratio  Hepatic function panel  Result Value Ref Range   Total Protein 7.2 6.0 - 8.5 g/dL   Albumin 4.3 3.8 - 4.9 g/dL   Bilirubin Total 0.4 0.0 - 1.2 mg/dL   Bilirubin, Direct <0.10 0.00 - 0.40 mg/dL   Alkaline Phosphatase 76 44 -  121 IU/L   AST 16 0 - 40 IU/L   ALT 12 0 - 32 IU/L  Basic metabolic panel  Result Value Ref Range   Glucose 132 (H) 70 - 99 mg/dL   BUN 11 6 - 24 mg/dL   Creatinine, Ser 0.64 0.57 - 1.00 mg/dL   eGFR 102 >59 mL/min/1.73   BUN/Creatinine Ratio 17 9 - 23   Sodium 142 134 - 144 mmol/L   Potassium 4.3 3.5 - 5.2 mmol/L   Chloride 102 96 - 106 mmol/L   CO2 21 20 - 29 mmol/L   Calcium 9.7 8.7 - 10.2 mg/dL  Hemoglobin A1c  Result Value Ref Range   Hgb A1c MFr Bld 6.6 (H) 4.8 - 5.6 %   Est. average glucose Bld gHb Est-mCnc 143 mg/dL       Assessment & Plan:   Problem List Items Addressed This Visit       Cardiovascular and Mediastinum    Essential hypertension, benign - Primary   Relevant Medications   rosuvastatin (CRESTOR) 20 MG tablet     Endocrine   Hyperlipidemia associated with type 2 diabetes mellitus ([facility])   Relevant Medications   rosuvastatin (CRESTOR) 20 MG tablet   Other Relevant Orders   Lipid panel   Type 2 diabetes mellitus ([facility])   Relevant Medications   rosuvastatin (CRESTOR) 20 MG tablet   Other Relevant Orders   Microalbumin/Creatinine Ratio, Urine   Other Visit Diagnoses     High risk medication use       Relevant Orders   Hepatic function panel      Meds ordered this encounter  Medications   rosuvastatin (CRESTOR) 20 MG tablet    Sig: Take 1 tablet (20 mg total) by mouth daily.    Dispense:  90 tablet    Refill:  0    Order Specific Question:   Supervising Provider    Answer:   Sallee Lange A [[id number]]   empagliflozin (JARDIANCE) 10 MG TABS tablet    Sig: Take 1 tablet (10 mg total) by mouth daily.    Dispense:  90 tablet    Refill:  0    Order Specific Question:   Supervising Provider    Answer:   Sallee Lange A [[id number]]   escitalopram (LEXAPRO) 20 MG tablet    Sig: TAKE 1 TABLET(20 MG) BY MOUTH DAILY    Dispense:  90 tablet    Refill:  0    Order Specific Question:   Supervising Provider    Answer:   Sallee Lange A [[id number]]   losartan (COZAAR) 100 MG tablet    Sig: TAKE 1 TABLET(100 MG) BY MOUTH DAILY    Dispense:  90 tablet    Refill:  0    Order Specific Question:   Supervising Provider    Answer:   Sallee Lange A [[id number]]           Orders: ACR, lipid panel, and liver enzyme recheck  Meds: Restart Jardiance. Send to [facility].   Education: Resume healthy lifestyle habits. Resume intermittent fasting. Use healthy oils when cooking. Increase exercise when weather cooler. Monitor daily FSBS.   Referrals: None.   Follow up: Repeat labs in 8-10 weeks.  Return in about 3 months (around 08/04/2022) for physical.

## 2022-05-05 NOTE — Progress Notes (Deleted)
     Subjective:     Patient ID: Susan Becker, female    DOB: [birth year], 61 y.o.   MRN: [id number]  Chief Complaint  Patient presents with   Diabetes    Follow up    Diabetes Pertinent negatives for hypoglycemia include no nervousness/anxiousness. Pertinent negatives for diabetes include no blurred vision, no chest pain and no weakness.   Patient is in today for Diabetic follow up. A1C6.6, Glucose 132, Triglycerides 321. Med maintenance as usual. Asked about going up on Mounjaro, but increased dose unavailable. Reports weight loss successful when intermittent fasting, but has gained back after vacation. Plans to walk when weather cools and resume diet. Daily FSBS 130-140. Defers Shingrix. Eye exam scheduled for January. Reports no visual changes. Denies respiratory changes or CVD issues. Reports new onset mild increase in hip pain when walking.   Review of Systems  Constitutional: Negative.   Eyes: Negative.  Negative for blurred vision.  Respiratory:  Negative for shortness of breath.   Cardiovascular: Negative.  Negative for chest pain and leg swelling.  Gastrointestinal: Negative.  Negative for constipation, diarrhea and nausea.  Genitourinary: Negative.   Musculoskeletal:  Positive for joint pain.  Skin: Negative.   Neurological:  Negative for sensory change and weakness.  Endo/Heme/Allergies: Negative.   Psychiatric/Behavioral:  The patient is not nervous/anxious.       Objective:    BP 138/82   Ht 5' 4.5" (1.638 m)   Wt 113.4 kg   LMP 06/21/2013   BMI 42.25 kg/m  {Vitals History (Optional):23777}  Physical Exam Constitutional:      Appearance: Normal appearance.  Eyes:     Conjunctiva/sclera: Conjunctivae normal.  Cardiovascular:     Pulses: Normal pulses.     Heart sounds: Normal heart sounds.  Pulmonary:     Effort: Pulmonary effort is normal.     Breath sounds: Normal breath sounds.  Musculoskeletal:     Right lower leg: No edema.     Left lower leg:  No edema.  Skin:    General: Skin is warm and dry.  Neurological:     Mental Status: She is alert and oriented to person, place, and time. Mental status is at baseline.     Sensory: No sensory deficit.  Psychiatric:        Mood and Affect: Mood normal.        Behavior: Behavior normal.   Monofilament sensation positive except L 5th metatarsal area numbness r/t sciatic history.  Pulses positive BIL LE. No edema  No results found for any visits on 05/05/22.      Assessment & Plan:   Problem List Items Addressed This Visit   None Orders repeat visit 8-10 week for f/u blood work for ACR, lipid panel, and liver enzyme recheck.    Med order changes: Start Jardiance, increase Rosuvestatin to 20 mg, and refill meds to [place]  No orders of the defined types were placed in this encounter.   No follow-ups on file.  Edd Fabian, RN

## 2022-05-06 NOTE — Progress Notes (Signed)
   Subjective:    Patient ID: Susan Becker, female    DOB: February 18, 1961, 61 y.o.   MRN: [id number]  HPI    Review of Systems     Objective:   Physical Exam        Assessment & Plan:

## 2022-05-12 ENCOUNTER — Encounter: Payer: Self-pay | Admitting: Nurse Practitioner

## 2022-05-12 ENCOUNTER — Other Ambulatory Visit: Payer: Self-pay | Admitting: Nurse Practitioner

## 2022-05-12 MED ORDER — DAPAGLIFLOZIN PROPANEDIOL 10 MG PO TABS: 10.0000 mg | ORAL_TABLET | Freq: Every day | ORAL | 0 refills | Status: DC

## 2022-05-16 ENCOUNTER — Other Ambulatory Visit: Payer: Self-pay

## 2022-05-16 MED ORDER — PANTOPRAZOLE SODIUM 40 MG PO TBEC: DELAYED_RELEASE_TABLET | ORAL | 1 refills | Status: DC

## 2022-05-23 ENCOUNTER — Other Ambulatory Visit (HOSPITAL_COMMUNITY)
Admission: RE | Admit: 2022-05-23 | Discharge: 2022-05-23 | Disposition: A | Discharge status: Home / Self Care | Payer: PRIVATE HEALTH INSURANCE | Source: Ambulatory Visit | Attending: Nurse Practitioner | Admitting: Nurse Practitioner

## 2022-05-23 ENCOUNTER — Other Ambulatory Visit: Payer: Self-pay | Admitting: Nurse Practitioner

## 2022-05-23 ENCOUNTER — Encounter: Payer: Self-pay | Admitting: Nurse Practitioner

## 2022-05-23 ENCOUNTER — Ambulatory Visit (HOSPITAL_COMMUNITY)
Admission: RE | Admit: 2022-05-23 | Discharge: 2022-05-23 | Disposition: A | Discharge status: Home / Self Care | Payer: PRIVATE HEALTH INSURANCE | Source: Ambulatory Visit | Attending: Nurse Practitioner | Admitting: Nurse Practitioner

## 2022-05-23 ENCOUNTER — Ambulatory Visit (INDEPENDENT_AMBULATORY_CARE_PROVIDER_SITE_OTHER): Payer: PRIVATE HEALTH INSURANCE | Admitting: Nurse Practitioner

## 2022-05-23 VITALS — BP 128/82 | Ht 64.5 in | Wt 253.6 lb

## 2022-05-23 DIAGNOSIS — M79605 Pain in left leg: Secondary | ICD-10-CM

## 2022-05-23 LAB — D-DIMER, QUANTITATIVE: D-Dimer, Quant: <0.27 ug/mL-FEU (ref 0.00–0.50)

## 2022-05-23 NOTE — Addendum Note (Signed)
Addended by: Claire Shown on: 05/23/2022 09:12 PM   Modules accepted: Orders

## 2022-05-23 NOTE — Progress Notes (Signed)
   Subjective:    Patient ID: Susan Becker, female    DOB: 1961-04-25, 61 y.o.   MRN: [id number]  HPI Patient arrives with left leg pain and knot s/p fall yesterday. Patient states she twisted her foot and lost balance and fell to the ground and land on her knees and hands. Patient now states that the knot to her leg is extremely painful and warm to touch. Patient concerned about DVTs.   Patient denies any chest pain, SOB, or difficulty breathing.  Review of Systems  Musculoskeletal:        Left leg pain  All other systems reviewed and are negative.      Objective:   Physical Exam Constitutional:      General: She is not in acute distress.    Appearance: Normal appearance. She is obese. She is not ill-appearing, toxic-appearing or diaphoretic.  HENT:     Head: Normocephalic and atraumatic.  Musculoskeletal:     Comments: Left leg pulses intact. Warm to touch. Knot noted to medial side of lower leg. Painful to touch. No ecchymosis noted or erythema noted.   Neurological:     Mental Status: She is alert.  Psychiatric:        Mood and Affect: Mood normal.        Behavior: Behavior normal.         Assessment & Plan:   1. Pain of left lower extremity - Suspect hematoma s/p fall. - However, due to patient's concerns will get Xray of left leg to r/o skeletal injuries and a D-Dimer. If D-Dimer elevated will continue with DVT U/S. If D-Dimer within normal limits will cancel DVT U/S and continue to monitor area. Patient stated understanding - US Venous Img Lower Unilateral Left (DVT) - DG Tibia/Fibula Left - D-dimer, quantitative - RTC if symptoms worsen or do not improve.

## 2022-05-26 ENCOUNTER — Encounter: Payer: Self-pay | Admitting: Nurse Practitioner

## 2022-05-26 ENCOUNTER — Ambulatory Visit (HOSPITAL_COMMUNITY)
Admission: RE | Admit: 2022-05-26 | Discharge: 2022-05-26 | Disposition: A | Discharge status: Home / Self Care | Payer: PRIVATE HEALTH INSURANCE | Source: Ambulatory Visit | Attending: Nurse Practitioner | Admitting: Nurse Practitioner

## 2022-05-26 ENCOUNTER — Other Ambulatory Visit: Payer: Self-pay | Admitting: Nurse Practitioner

## 2022-05-26 DIAGNOSIS — M79605 Pain in left leg: Secondary | ICD-10-CM | POA: Diagnosis not present

## 2022-05-26 MED ORDER — LANTUS SOLOSTAR 100 UNIT/ML ~~LOC~~ SOPN: PEN_INJECTOR | SUBCUTANEOUS | 1 refills | Status: DC

## 2022-06-12 ENCOUNTER — Ambulatory Visit (HOSPITAL_COMMUNITY)
Admission: RE | Admit: 2022-06-12 | Discharge: 2022-06-12 | Disposition: A | Discharge status: Home / Self Care | Payer: PRIVATE HEALTH INSURANCE | Source: Ambulatory Visit | Attending: Family Medicine | Admitting: Family Medicine

## 2022-06-12 DIAGNOSIS — Z1231 Encounter for screening mammogram for malignant neoplasm of breast: Secondary | ICD-10-CM | POA: Diagnosis present

## 2022-06-15 ENCOUNTER — Other Ambulatory Visit: Payer: Self-pay | Admitting: Nurse Practitioner

## 2022-06-16 ENCOUNTER — Other Ambulatory Visit: Payer: Self-pay

## 2022-06-19 NOTE — Telephone Encounter (Signed)
It could not be submitted through cover my meds- had to use the insurance portal prompt [email or web address]- I submitted Friday and am awaiting decision.

## 2022-06-19 NOTE — Telephone Encounter (Signed)
Pearson Forster C, NP     I went into Covermymeds and filled out the PA. It indicated that this med is covered under her insurance. How do I find out if there is a preferred in that class? Thanks! Hoyle Sauer

## 2022-06-30 ENCOUNTER — Other Ambulatory Visit: Payer: Self-pay | Admitting: Nurse Practitioner

## 2022-06-30 MED ORDER — DAPAGLIFLOZIN PROPANEDIOL 10 MG PO TABS: 10.0000 mg | ORAL_TABLET | Freq: Every day | ORAL | 2 refills | Status: DC

## 2022-07-22 ENCOUNTER — Other Ambulatory Visit: Payer: Self-pay | Admitting: Family Medicine

## 2022-07-31 ENCOUNTER — Encounter: Payer: PRIVATE HEALTH INSURANCE | Admitting: Nurse Practitioner

## 2022-08-01 LAB — MICROALBUMIN / CREATININE URINE RATIO
Creatinine, Urine: 116.8 mg/dL
Microalb/Creat Ratio: 11 mg/g creat (ref 0–29)
Microalbumin, Urine: 12.4 ug/mL

## 2022-08-01 LAB — LIPID PANEL
Chol/HDL Ratio: 3.2 ratio (ref 0.0–4.4)
Cholesterol, Total: 149 mg/dL (ref 100–199)
HDL: 47 mg/dL (ref >39)
LDL Chol Calc (NIH): 61 mg/dL (ref 0–99)
Triglycerides: 258 mg/dL — ABNORMAL HIGH (ref 0–149)
VLDL Cholesterol Cal: 41 mg/dL — ABNORMAL HIGH (ref 5–40)

## 2022-08-01 LAB — HEPATIC FUNCTION PANEL
ALT: 13 IU/L (ref 0–32)
AST: 17 IU/L (ref 0–40)
Albumin: 4.5 g/dL (ref 3.8–4.9)
Alkaline Phosphatase: 78 IU/L (ref 44–121)
Bilirubin Total: 0.4 mg/dL (ref 0.0–1.2)
Bilirubin, Direct: 0.13 mg/dL (ref 0.00–0.40)
Total Protein: 7.3 g/dL (ref 6.0–8.5)

## 2022-08-04 ENCOUNTER — Encounter: Payer: Self-pay | Admitting: Nurse Practitioner

## 2022-08-04 ENCOUNTER — Ambulatory Visit (INDEPENDENT_AMBULATORY_CARE_PROVIDER_SITE_OTHER): Payer: PRIVATE HEALTH INSURANCE | Admitting: Nurse Practitioner

## 2022-08-04 VITALS — BP 130/78 | HR 70 | Temp 98.1°F | Ht 64.5 in | Wt 247.0 lb

## 2022-08-04 DIAGNOSIS — B3731 Acute candidiasis of vulva and vagina: Secondary | ICD-10-CM

## 2022-08-04 DIAGNOSIS — Z1151 Encounter for screening for human papillomavirus (HPV): Secondary | ICD-10-CM | POA: Diagnosis not present

## 2022-08-04 DIAGNOSIS — Z01419 Encounter for gynecological examination (general) (routine) without abnormal findings: Secondary | ICD-10-CM | POA: Diagnosis not present

## 2022-08-04 DIAGNOSIS — E119 Type 2 diabetes mellitus without complications: Secondary | ICD-10-CM

## 2022-08-04 DIAGNOSIS — Z794 Long term (current) use of insulin: Secondary | ICD-10-CM

## 2022-08-04 DIAGNOSIS — Z124 Encounter for screening for malignant neoplasm of cervix: Secondary | ICD-10-CM

## 2022-08-04 MED ORDER — FLUCONAZOLE 150 MG PO TABS: ORAL_TABLET | ORAL | 0 refills | Status: DC

## 2022-08-04 MED ORDER — TIRZEPATIDE 10 MG/0.5ML ~~LOC~~ SOAJ: 10.0000 mg | SUBCUTANEOUS | 2 refills | Status: DC

## 2022-08-04 NOTE — Progress Notes (Signed)
Subjective:    Patient ID: Susan Becker, female    DOB: 07-Mar-1961, 61 y.o.   MRN: [id number]  HPI The patient comes in today for a wellness visit.    A review of their health history was completed.  A review of medications was also completed.  Eating habits: fair  Falls/  MVA accidents in past few months: no  Regular exercise:yes, walk 3 x week  Specialist pt sees on regular basis: no  Preventative health issues were discussed.   Additional concerns: Patient states she is having issues hearing out of both ears.  Married, same sexual partner.  Denies any vaginal bleeding since her last physical.  Her eye exam and dental exams are up-to-date.  Mammogram was done in October.  Had her first [employer] booster in October as well as her flu vaccine. Doing well on current dose of Mounjaro as far as side effects, has rare nausea and occasional slight loose stools.  States neither issue is a major problem.  Is interested in going up on the dosage at this time.   Review of Systems  Constitutional:  Negative for activity change, appetite change and fatigue.  HENT:  Positive for hearing loss. Negative for sore throat and trouble swallowing.   Respiratory:  Negative for cough, chest tightness, shortness of breath and wheezing.   Cardiovascular:  Negative for chest pain.  Gastrointestinal:  Positive for diarrhea. Negative for abdominal distention, abdominal pain, constipation, nausea and vomiting.  Genitourinary:  Positive for urgency. Negative for difficulty urinating, dysuria, enuresis, frequency, genital sores, pelvic pain, vaginal bleeding and vaginal discharge.       Also vaginal itching. No discharge.      08/04/2022   10:06 AM  Depression screen PHQ 2/9  Decreased Interest 0  Down, Depressed, Hopeless 0  PHQ - 2 Score 0  Altered sleeping 0  Tired, decreased energy 0  Change in appetite 0  Feeling bad or failure about yourself  0  Trouble concentrating 0  Moving slowly or  fidgety/restless 0  Suicidal thoughts 0  PHQ-9 Score 0  Difficult doing work/chores Not difficult at all      08/04/2022   10:06 AM 07/09/2020   10:11 AM 10/29/2018    3:12 PM  GAD 7 : Generalized Anxiety Score  Nervous, Anxious, on Edge 1 0 0  Control/stop worrying 1 2 0  Worry too much - different things '1 1 1  '$ Trouble relaxing 1 1 0  Restless 0 0 0  Easily annoyed or irritable 0 0 0  Afraid - awful might happen 0 0 0  Total GAD 7 Score '4 4 1  '$ Anxiety Difficulty Not difficult at all Not difficult at all Not difficult at all         Objective:   Physical Exam Vitals and nursing note reviewed.  Constitutional:      General: She is not in acute distress.    Appearance: She is well-developed.  HENT:     Ears:     Comments: TMs mild clear effusion bilaterally.  No erythema. Neck:     Thyroid: No thyromegaly.     Trachea: No tracheal deviation.     Comments: Thyroid non tender to palpation. No mass or goiter noted.  Cardiovascular:     Rate and Rhythm: Normal rate and regular rhythm.     Heart sounds: Normal heart sounds. No murmur heard. Pulmonary:     Effort: Pulmonary effort is normal.     Breath  sounds: Normal breath sounds.  Chest:  Breasts:    Right: No swelling, inverted nipple, mass, skin change or tenderness.     Left: No swelling, inverted nipple, mass, skin change or tenderness.  Abdominal:     General: There is no distension.     Palpations: Abdomen is soft.     Tenderness: There is no abdominal tenderness.  Genitourinary:    General: Normal vulva.     Labia:        Right: No rash, tenderness or lesion.        Left: No rash, tenderness or lesion.      Vagina: Normal.     Cervix: No cervical motion tenderness or lesion.     Comments: Minimal amount of white clumpy discharge noted. Mainly white mucoid.  Musculoskeletal:     Cervical back: Normal range of motion and neck supple.  Lymphadenopathy:     Cervical: No cervical adenopathy.     Upper Body:      Right upper body: No supraclavicular, axillary or pectoral adenopathy.     Left upper body: No supraclavicular, axillary or pectoral adenopathy.  Skin:    General: Skin is warm and dry.     Findings: No rash.  Neurological:     Mental Status: She is alert and oriented to person, place, and time.  Psychiatric:        Mood and Affect: Mood normal.        Behavior: Behavior normal.        Thought Content: Thought content normal.        Judgment: Judgment normal.    Diabetic Foot Exam - Simple   Simple Foot Form Visual Inspection No deformities, no ulcerations, no other skin breakdown bilaterally: Yes Sensation Testing See comments: Yes Pulse Check See comments: Yes Comments DP pulses intact bilaterally. Skin dry with peeling on both heels but no breakdown or infection. Sensation present to monofilament in toes but slightly diminished left compared to right. Patient attributes this to sciatica.      Today's Vitals   08/04/22 0956  BP: 130/78  Pulse: 70  Temp: 98.1 F (36.7 C)  TempSrc: Oral  SpO2: 98%  Weight: 247 lb (112 kg)  Height: 5' 4.5" (1.638 m)   Body mass index is 41.74 kg/m.  Hepatic function panel     Status: None   Collection Time: 07/31/22  8:38 AM  Result Value Ref Range   Total Protein 7.3 6.0 - 8.5 g/dL   Albumin 4.5 3.8 - 4.9 g/dL   Bilirubin Total 0.4 0.0 - 1.2 mg/dL   Bilirubin, Direct 0.13 0.00 - 0.40 mg/dL   Alkaline Phosphatase 78 44 - 121 IU/L   AST 17 0 - 40 IU/L   ALT 13 0 - 32 IU/L  Lipid panel     Status: Abnormal   Collection Time: 07/31/22  8:38 AM  Result Value Ref Range   Cholesterol, Total 149 100 - 199 mg/dL   Triglycerides 258 (H) 0 - 149 mg/dL   HDL 47 >39 mg/dL   VLDL Cholesterol Cal 41 (H) 5 - 40 mg/dL   LDL Chol Calc (NIH) 61 0 - 99 mg/dL   Chol/HDL Ratio 3.2 0.0 - 4.4 ratio    Comment:                                   T. Chol/HDL Ratio  Men  Women                                1/2 Avg.Risk  3.4    3.3                                   Avg.Risk  5.0    4.4                                2X Avg.Risk  9.6    7.1                                3X Avg.Risk 23.4   11.0   Microalbumin/Creatinine Ratio, Urine     Status: None   Collection Time: 07/31/22  8:38 AM  Result Value Ref Range   Creatinine, Urine 116.8 Not Estab. mg/dL   Microalbumin, Urine 12.4 Not Estab. ug/mL   Microalb/Creat Ratio 11 0 - 29 mg/g creat    Comment:                        Normal:                0 -  29                        Moderately increased: 30 - 300                        Severely increased:       >300    Reviewed labs with patient.       Assessment & Plan:   Problem List Items Addressed This Visit       Endocrine   Type 2 diabetes mellitus ([place])   Relevant Medications   tirzepatide (MOUNJARO) 10 MG/0.5ML Pen   Other Visit Diagnoses     Well woman exam    -  Primary   Relevant Orders   IGP, Aptima HPV   Screening for cervical cancer       Relevant Orders   IGP, Aptima HPV   Screening for HPV (human papillomavirus)       Relevant Orders   IGP, Aptima HPV   Vagina, candidiasis       Relevant Medications   fluconazole (DIFLUCAN) 150 MG tablet      Meds ordered this encounter  Medications   fluconazole (DIFLUCAN) 150 MG tablet    Sig: One po qd prn yeast infection; may repeat in 3-4 days if needed    Dispense:  2 tablet    Refill:  0    Order Specific Question:   Supervising Provider    Answer:   Sallee Lange A [[id number]]   tirzepatide (MOUNJARO) 10 MG/0.5ML Pen    Sig: Inject 10 mg into the skin once a week.    Dispense:  2 mL    Refill:  2    Order Specific Question:   Supervising Provider    Answer:   Sallee Lange A [[id number]]   Diflucan for vaginal candidiasis.  Call back if itching persist. Increase Mounjaro dose to 10 mg weekly.  Again reviewed potential adverse effects.  Discontinue medication and  contact office if any problems. Discussed the  importance of healthy diet and weight loss.  Encourage patient to look at program such as weight watchers to help. Return in about 3 months (around 11/03/2022). Call back sooner if any problems.

## 2022-08-07 ENCOUNTER — Encounter: Payer: Self-pay | Admitting: Family Medicine

## 2022-08-10 ENCOUNTER — Other Ambulatory Visit: Payer: Self-pay

## 2022-08-10 ENCOUNTER — Telehealth: Payer: Self-pay | Admitting: Family Medicine

## 2022-08-10 MED ORDER — ROSUVASTATIN CALCIUM 20 MG PO TABS: 20.0000 mg | ORAL_TABLET | Freq: Every day | ORAL | 0 refills | Status: DC

## 2022-08-10 MED ORDER — LOSARTAN POTASSIUM 100 MG PO TABS: ORAL_TABLET | ORAL | 0 refills | Status: DC

## 2022-08-10 MED ORDER — ESCITALOPRAM OXALATE 20 MG PO TABS: ORAL_TABLET | ORAL | 1 refills | Status: DC

## 2022-08-10 NOTE — Telephone Encounter (Signed)
Refill sent to pharmacy.   

## 2022-08-10 NOTE — Addendum Note (Signed)
Addended by: Vicente Males on: 08/10/2022 04:31 PM   Modules accepted: Orders

## 2022-08-10 NOTE — Telephone Encounter (Signed)
May have 90 with 1 refill

## 2022-08-10 NOTE — Telephone Encounter (Signed)
[facility] [address] requesting refill on Escitalopram 20 mg tablets- take one tablet po daily. Pt last seen Hoyle Sauer 08/04/22 for wellness. Please advise. Thank you.

## 2022-08-13 LAB — IGP, APTIMA HPV: HPV Aptima: NEGATIVE

## 2022-08-22 LAB — HM DIABETES EYE EXAM

## 2022-09-11 ENCOUNTER — Ambulatory Visit (INDEPENDENT_AMBULATORY_CARE_PROVIDER_SITE_OTHER): Payer: PRIVATE HEALTH INSURANCE | Admitting: Family Medicine

## 2022-09-11 DIAGNOSIS — J019 Acute sinusitis, unspecified: Secondary | ICD-10-CM

## 2022-09-11 MED ORDER — DOXYCYCLINE HYCLATE 100 MG PO TABS: 100.0000 mg | ORAL_TABLET | Freq: Two times a day (BID) | ORAL | 0 refills | Status: DC

## 2022-09-11 MED ORDER — ALBUTEROL SULFATE HFA 108 (90 BASE) MCG/ACT IN AERS: 2.0000 | INHALATION_SPRAY | Freq: Four times a day (QID) | RESPIRATORY_TRACT | 0 refills | Status: DC | PRN

## 2022-09-11 NOTE — Progress Notes (Signed)
   Subjective:    Patient ID: Susan Becker, female    DOB: 12-09-1960, 62 y.o.   MRN: [id number]  HPI  Cough mostly dry, some productive mucus color green x 1 week Sore throat, winded with activity, fatigue No testing done for covid Taking dayquil/ nyquil Review of Systems     Objective:   Physical Exam  Gen-NAD not toxic TMS-normal bilateral T- normal no redness Chest-CTA respiratory rate normal no crackles CV RRR no murmur Skin-warm dry Neuro-grossly normal   Patient was encouraged to do home COVID although the likelihood of COVID is low    Assessment & Plan:  Viral syndrome for over 7 days Secondary rhinosinusitis Doxycycline prescribed Warning signs discussed No x-ray or lab work indicated currently

## 2022-09-14 ENCOUNTER — Other Ambulatory Visit: Payer: Self-pay | Admitting: Nurse Practitioner

## 2022-09-19 ENCOUNTER — Telehealth: Payer: Self-pay

## 2022-09-19 NOTE — Telephone Encounter (Signed)
Encourage patient to contact the pharmacy for refills or they can request refills through [facility]  (Please schedule appointment if patient has not been seen in over a year)    [place] TO:   MEDICATION NAME & DOSE:  NOTES/COMMENTS FROM PATIENT:      [place] office please notify patient: It takes 48-72 hours to process rx refill requests Ask patient to call pharmacy to ensure rx is ready before heading there.

## 2022-09-20 ENCOUNTER — Other Ambulatory Visit: Payer: Self-pay | Admitting: *Deleted

## 2022-09-20 ENCOUNTER — Encounter: Payer: Self-pay | Admitting: *Deleted

## 2022-09-20 MED ORDER — LANTUS SOLOSTAR 100 UNIT/ML ~~LOC~~ SOPN: PEN_INJECTOR | SUBCUTANEOUS | 1 refills | Status: DC

## 2022-09-22 ENCOUNTER — Other Ambulatory Visit: Payer: Self-pay | Admitting: Nurse Practitioner

## 2022-09-26 ENCOUNTER — Encounter: Payer: Self-pay | Admitting: Nurse Practitioner

## 2022-09-26 NOTE — Telephone Encounter (Signed)
Nurses It is possible the prescription refill requests came in?  I have not seen it?  Perhaps it is in Carolyn's inbox but she is only here 1 day/week  I was able to review over the note from Brielle back in [place] stated that she wanted the patient to try 10 mg I can see where the 10 mg was sent to the pharmacy If the patient is willing to utilize Mounjaro 10 mg weekly please send in 1 month supply with 2 refills If for some reason 10 mg is not available please by all means let us know  Unfortunately there is no easy way to know if this is in stock or not other than trying to send it in  Please have the patient communicate to [place] if there is any ongoing issues thank you

## 2022-09-26 NOTE — Telephone Encounter (Signed)
Disregard previous  message

## 2022-09-26 NOTE — Telephone Encounter (Signed)
Pt in for office visit with Dr.Scott today

## 2022-09-27 ENCOUNTER — Other Ambulatory Visit: Payer: Self-pay

## 2022-09-27 MED ORDER — TIRZEPATIDE 10 MG/0.5ML ~~LOC~~ SOAJ: 10.0000 mg | SUBCUTANEOUS | 2 refills | Status: DC

## 2022-09-28 ENCOUNTER — Telehealth: Payer: Self-pay | Admitting: Family Medicine

## 2022-09-28 NOTE — Telephone Encounter (Signed)
PA form, office notes and labs faxed.

## 2022-09-28 NOTE — Telephone Encounter (Signed)
PA for Mounjaro 10 mg completed and will be faxed to [employer] once signed by provider. Recent office notes and lab values attached to be faxed with form also.

## 2022-10-03 NOTE — Telephone Encounter (Signed)
Mounjaro approved and pt and pharmacy are aware.

## 2022-10-06 ENCOUNTER — Telehealth: Payer: Self-pay | Admitting: Family Medicine

## 2022-10-06 NOTE — Telephone Encounter (Signed)
PA completed through [email or web address] for Farxiga 10 mg. Prior Auth ID [id number]; Member ID [id number].

## 2022-10-09 NOTE — Telephone Encounter (Signed)
Fax from [employer] PA that PA is not required.

## 2022-10-18 ENCOUNTER — Encounter: Payer: Self-pay | Admitting: Nurse Practitioner

## 2022-10-20 ENCOUNTER — Other Ambulatory Visit: Payer: Self-pay | Admitting: Nurse Practitioner

## 2022-10-20 ENCOUNTER — Other Ambulatory Visit: Payer: Self-pay | Admitting: *Deleted

## 2022-10-20 DIAGNOSIS — Z794 Long term (current) use of insulin: Secondary | ICD-10-CM

## 2022-10-20 MED ORDER — METFORMIN HCL 1000 MG PO TABS: ORAL_TABLET | ORAL | 0 refills | Status: DC

## 2022-11-01 LAB — HEMOGLOBIN A1C
Est. average glucose Bld gHb Est-mCnc: 131 mg/dL
Hgb A1c MFr Bld: 6.2 % — ABNORMAL HIGH (ref 4.8–5.6)

## 2022-11-03 ENCOUNTER — Ambulatory Visit: Payer: PRIVATE HEALTH INSURANCE | Admitting: Nurse Practitioner

## 2022-11-03 VITALS — BP 133/82 | Ht 64.5 in | Wt 232.8 lb

## 2022-11-03 DIAGNOSIS — G4733 Obstructive sleep apnea (adult) (pediatric): Secondary | ICD-10-CM | POA: Diagnosis not present

## 2022-11-03 DIAGNOSIS — N950 Postmenopausal bleeding: Secondary | ICD-10-CM | POA: Diagnosis not present

## 2022-11-03 DIAGNOSIS — E785 Hyperlipidemia, unspecified: Secondary | ICD-10-CM

## 2022-11-03 DIAGNOSIS — I10 Essential (primary) hypertension: Secondary | ICD-10-CM

## 2022-11-03 DIAGNOSIS — E1169 Type 2 diabetes mellitus with other specified complication: Secondary | ICD-10-CM

## 2022-11-03 DIAGNOSIS — R9389 Abnormal findings on diagnostic imaging of other specified body structures: Secondary | ICD-10-CM

## 2022-11-03 DIAGNOSIS — Z794 Long term (current) use of insulin: Secondary | ICD-10-CM

## 2022-11-03 NOTE — Progress Notes (Unsigned)
   Subjective:    Patient ID: Susan Becker, female    DOB: 04-03-1961, 62 y.o.   MRN: [id number]  Diabetes She presents for her follow-up diabetic visit. She has type 2 diabetes mellitus. Risk factors for coronary artery disease include diabetes mellitus, hypertension, obesity and post-menopausal. She is compliant with treatment all of the time.   No problems or concerns  Review of Systems     Objective:   Physical Exam        Assessment & Plan:

## 2022-11-04 ENCOUNTER — Encounter: Payer: Self-pay | Admitting: Nurse Practitioner

## 2022-11-10 ENCOUNTER — Encounter: Payer: Self-pay | Admitting: Family Medicine

## 2022-11-13 NOTE — Telephone Encounter (Signed)
May go ahead and send in 7.5 Mounjaro 1 month supply with 3 refills

## 2022-11-14 ENCOUNTER — Encounter: Payer: Self-pay | Admitting: Family Medicine

## 2022-11-14 ENCOUNTER — Ambulatory Visit (HOSPITAL_COMMUNITY)
Admission: RE | Admit: 2022-11-14 | Discharge: 2022-11-14 | Disposition: A | Discharge status: Home / Self Care | Payer: PRIVATE HEALTH INSURANCE | Source: Ambulatory Visit | Attending: Family Medicine | Admitting: Family Medicine

## 2022-11-14 DIAGNOSIS — N950 Postmenopausal bleeding: Secondary | ICD-10-CM | POA: Diagnosis present

## 2022-11-15 MED ORDER — TIRZEPATIDE 7.5 MG/0.5ML ~~LOC~~ SOAJ: 7.5000 mg | SUBCUTANEOUS | 0 refills | Status: DC

## 2022-11-15 NOTE — Telephone Encounter (Signed)
Dr Nicki Reaper sent in prescription as requested- see other message

## 2022-11-17 NOTE — Addendum Note (Signed)
Addended by: Dairl Ponder on: 11/17/2022 09:16 AM   Modules accepted: Orders

## 2022-11-20 ENCOUNTER — Other Ambulatory Visit: Payer: Self-pay | Admitting: Family Medicine

## 2022-11-29 ENCOUNTER — Other Ambulatory Visit: Payer: Self-pay | Admitting: Family Medicine

## 2022-11-30 ENCOUNTER — Encounter: Payer: Self-pay | Admitting: Obstetrics & Gynecology

## 2022-11-30 ENCOUNTER — Inpatient Hospital Stay (HOSPITAL_COMMUNITY): Admit: 2022-11-30 | Payer: PRIVATE HEALTH INSURANCE

## 2022-11-30 ENCOUNTER — Other Ambulatory Visit (HOSPITAL_COMMUNITY)
Admission: RE | Admit: 2022-11-30 | Discharge: 2022-11-30 | Disposition: A | Discharge status: Home / Self Care | Payer: PRIVATE HEALTH INSURANCE | Source: Ambulatory Visit | Attending: Obstetrics & Gynecology | Admitting: Obstetrics & Gynecology

## 2022-11-30 ENCOUNTER — Ambulatory Visit (INDEPENDENT_AMBULATORY_CARE_PROVIDER_SITE_OTHER): Payer: PRIVATE HEALTH INSURANCE | Admitting: Obstetrics & Gynecology

## 2022-11-30 VITALS — BP 132/69 | HR 73 | Ht 64.0 in | Wt 232.0 lb

## 2022-11-30 DIAGNOSIS — R9389 Abnormal findings on diagnostic imaging of other specified body structures: Secondary | ICD-10-CM

## 2022-11-30 DIAGNOSIS — N950 Postmenopausal bleeding: Secondary | ICD-10-CM | POA: Diagnosis present

## 2022-11-30 NOTE — Progress Notes (Signed)
Endometrial Biopsy Procedure Note  Pre-operative Diagnosis: Post menopausal bleeding with 9 mm thickened endometrium on sonogram, Type 2 diabetes  Post-operative Diagnosis: same  Indications: postmenopausal bleeding  Procedure Details   Urine pregnancy test was not done.  The risks (including infection, bleeding, pain, and uterine perforation) and benefits of the procedure were explained to the patient and Written informed consent was obtained.  Antibiotic prophylaxis against endocarditis was not indicated.   The patient was placed in the dorsal lithotomy position.  Bimanual exam showed the uterus to be in the neutral position.  A Graves' speculum inserted in the vagina, and the cervix prepped with povidone iodine.  Endocervical curettage with a Kevorkian curette was not performed.   A sharp tenaculum was applied to the anterior lip of the cervix for stabilization.  A sterile uterine sound was used to sound the uterus to a depth of 6.5 cm.  A Pipelle endometrial aspirator was used to sample the endometrium.  Sample was sent for pathologic examination.  Condition: Stable  Complications: None  Plan:  The patient was advised to call for any fever or for prolonged or severe pain or bleeding. She was advised to use OTC analgesics as needed for mild to moderate pain. She was advised to avoid vaginal intercourse for 48 hours or until the bleeding has completely stopped.  Attending Physician Documentation: I was present for or performed the following: endometrial biopsy

## 2022-11-30 NOTE — Addendum Note (Signed)
Addended by: Moss Mc on: 11/30/2022 04:03 PM   Modules accepted: Orders

## 2022-12-04 ENCOUNTER — Encounter: Payer: Self-pay | Admitting: Obstetrics & Gynecology

## 2022-12-04 LAB — SURGICAL PATHOLOGY

## 2022-12-09 ENCOUNTER — Other Ambulatory Visit: Payer: Self-pay | Admitting: Family Medicine

## 2022-12-18 ENCOUNTER — Other Ambulatory Visit: Payer: Self-pay | Admitting: Family Medicine

## 2023-01-03 ENCOUNTER — Telehealth: Payer: Self-pay | Admitting: Family Medicine

## 2023-01-03 NOTE — Telephone Encounter (Signed)
Patient is requesting refill on Farxiga 10 mg sent to [facility] [address]

## 2023-01-04 MED ORDER — DAPAGLIFLOZIN PROPANEDIOL 10 MG PO TABS: 10.0000 mg | ORAL_TABLET | Freq: Every day | ORAL | 2 refills | Status: DC

## 2023-01-04 NOTE — Telephone Encounter (Signed)
Received via fax Rx request: Prescription sent electronically to pharmacy  

## 2023-01-10 ENCOUNTER — Telehealth: Payer: Self-pay | Admitting: Family Medicine

## 2023-01-10 DIAGNOSIS — E119 Type 2 diabetes mellitus without complications: Secondary | ICD-10-CM

## 2023-01-10 DIAGNOSIS — I10 Essential (primary) hypertension: Secondary | ICD-10-CM

## 2023-01-10 DIAGNOSIS — Z79899 Other long term (current) drug therapy: Secondary | ICD-10-CM

## 2023-01-10 DIAGNOSIS — E1169 Type 2 diabetes mellitus with other specified complication: Secondary | ICD-10-CM

## 2023-01-10 NOTE — Telephone Encounter (Signed)
Patient has an appt with Eber Jones on 6/6 for a follow up on her A1C. She would like the orders placed so she can get  her labs drawn.  CB# [phone]

## 2023-01-11 NOTE — Telephone Encounter (Signed)
Lipid, liver, metabolic 7, A1c Diabetes, hyperlipidemia, high risk med

## 2023-01-12 NOTE — Telephone Encounter (Signed)
Blood work ordered in EPIC. Left message to return call  

## 2023-01-18 ENCOUNTER — Encounter: Payer: Self-pay | Admitting: *Deleted

## 2023-01-18 ENCOUNTER — Telehealth: Payer: Self-pay

## 2023-01-18 MED ORDER — LANTUS SOLOSTAR 100 UNIT/ML ~~LOC~~ SOPN: PEN_INJECTOR | SUBCUTANEOUS | 1 refills | Status: DC

## 2023-01-18 NOTE — Telephone Encounter (Signed)
Received via fax Rx request: Prescription sent electronically to pharmacy  

## 2023-01-18 NOTE — Telephone Encounter (Signed)
Prescription Request  01/18/2023  LOV: Visit date not found  What is the name of the medication or equipment? insulin glargine (LANTUS SOLOSTAR) 100 UNIT/ML Solostar Pen   Have you contacted your pharmacy to request a refill? Yes   Which pharmacy would you like this sent to? [facility] [address]   Patient notified that their request is being sent to the clinical staff for review and that they should receive a response within 2 business days.   Please advise at Mobile [phone] (mobile)

## 2023-01-18 NOTE — Telephone Encounter (Signed)
Unable to reach patient by phone. Patient notified via my chart 

## 2023-01-24 LAB — HEPATIC FUNCTION PANEL
ALT: 12 IU/L (ref 0–32)
AST: 14 IU/L (ref 0–40)
Albumin: 4.1 g/dL (ref 3.8–4.9)
Alkaline Phosphatase: 74 IU/L (ref 44–121)
Bilirubin Total: 0.3 mg/dL (ref 0.0–1.2)
Bilirubin, Direct: 0.1 mg/dL (ref 0.00–0.40)
Total Protein: 7 g/dL (ref 6.0–8.5)

## 2023-01-24 LAB — LIPID PANEL
Chol/HDL Ratio: 3.5 ratio (ref 0.0–4.4)
Cholesterol, Total: 149 mg/dL (ref 100–199)
HDL: 42 mg/dL (ref >39)
LDL Chol Calc (NIH): 69 mg/dL (ref 0–99)
Triglycerides: 236 mg/dL — ABNORMAL HIGH (ref 0–149)
VLDL Cholesterol Cal: 38 mg/dL (ref 5–40)

## 2023-01-24 LAB — BASIC METABOLIC PANEL
BUN/Creatinine Ratio: 19 (ref 9–23)
BUN: 13 mg/dL (ref 6–24)
CO2: 22 mmol/L (ref 20–29)
Calcium: 9 mg/dL (ref 8.7–10.2)
Chloride: 106 mmol/L (ref 96–106)
Creatinine, Ser: 0.68 mg/dL (ref 0.57–1.00)
Glucose: 125 mg/dL — ABNORMAL HIGH (ref 70–99)
Potassium: 4.1 mmol/L (ref 3.5–5.2)
Sodium: 143 mmol/L (ref 134–144)
eGFR: 100 mL/min/{1.73_m2} (ref >59)

## 2023-01-24 LAB — HEMOGLOBIN A1C
Est. average glucose Bld gHb Est-mCnc: 131 mg/dL
Hgb A1c MFr Bld: 6.2 % — ABNORMAL HIGH (ref 4.8–5.6)

## 2023-01-25 ENCOUNTER — Other Ambulatory Visit: Payer: Self-pay | Admitting: Family Medicine

## 2023-01-25 ENCOUNTER — Ambulatory Visit (INDEPENDENT_AMBULATORY_CARE_PROVIDER_SITE_OTHER): Payer: PRIVATE HEALTH INSURANCE | Admitting: Nurse Practitioner

## 2023-01-25 ENCOUNTER — Ambulatory Visit (HOSPITAL_COMMUNITY)
Admission: RE | Admit: 2023-01-25 | Discharge: 2023-01-25 | Disposition: A | Discharge status: Home / Self Care | Payer: PRIVATE HEALTH INSURANCE | Source: Ambulatory Visit | Attending: Family Medicine | Admitting: Family Medicine

## 2023-01-25 VITALS — BP 126/78 | HR 68 | Ht 64.0 in | Wt 235.4 lb

## 2023-01-25 DIAGNOSIS — I10 Essential (primary) hypertension: Secondary | ICD-10-CM

## 2023-01-25 DIAGNOSIS — E785 Hyperlipidemia, unspecified: Secondary | ICD-10-CM

## 2023-01-25 DIAGNOSIS — M5442 Lumbago with sciatica, left side: Secondary | ICD-10-CM

## 2023-01-25 DIAGNOSIS — G8929 Other chronic pain: Secondary | ICD-10-CM | POA: Diagnosis present

## 2023-01-25 DIAGNOSIS — E1142 Type 2 diabetes mellitus with diabetic polyneuropathy: Secondary | ICD-10-CM | POA: Diagnosis not present

## 2023-01-25 DIAGNOSIS — G4733 Obstructive sleep apnea (adult) (pediatric): Secondary | ICD-10-CM

## 2023-01-25 DIAGNOSIS — E119 Type 2 diabetes mellitus without complications: Secondary | ICD-10-CM

## 2023-01-25 DIAGNOSIS — E1169 Type 2 diabetes mellitus with other specified complication: Secondary | ICD-10-CM

## 2023-01-25 DIAGNOSIS — K219 Gastro-esophageal reflux disease without esophagitis: Secondary | ICD-10-CM | POA: Diagnosis not present

## 2023-01-25 DIAGNOSIS — Z794 Long term (current) use of insulin: Secondary | ICD-10-CM

## 2023-01-25 NOTE — Patient Instructions (Signed)
Shingles vaccine

## 2023-01-25 NOTE — Progress Notes (Signed)
Reviewed with patient at visit on 01/25/23.

## 2023-01-25 NOTE — Progress Notes (Signed)
Subjective:    Patient ID: Susan Becker, female    DOB: [birth year], 62 y.o.   MRN: [id number]  HPI Presents for routine follow-up.  Ran out of her Mounjaro a while back, would like to restart.  Had difficulty finding the medication.  Reflux is stable on pantoprazole.  Will be getting a new glucose testing machine and requesting a new prescription.  Using her CPAP nightly, has only missed 1 night when she was out of town.  Does help her fatigue and overall feels better. Having persistent pain in the left lumbar area for over 6 months.  No history of injury.  Pain radiates down the lateral hip area into the lateral left leg down to the foot.  Causes burning sensation at times.  States her small toe feels numb.  No change in bowel or bladder habits.  Worse with prolonged standing in 1 position for over 5 minutes.  Has stiffness and pain if she has prolonged sitting and goes to a standing position.  Has not had an x-ray of her low back.  No specific OTC treatments have helped.  No weakness or loss of balance.  Has some generalized pain across the neck and shoulder area but no specific numbness or weakness of the left arm.   Review of Systems  Constitutional:  Positive for fatigue.  Respiratory:  Negative for cough, chest tightness and shortness of breath.   Cardiovascular:  Negative for chest pain and leg swelling.  Musculoskeletal:  Positive for back pain.  Neurological:  Positive for numbness.       Objective:   Physical Exam NAD.  Alert, oriented.  Lungs clear.  Heart regular rate rhythm.  Abdomen soft nondistended nontender.  Lower extremities no edema.  Tenderness with palpation of the left lumbar area.  Gait slow but steady. All of patient's preventive health measures are up-to-date. Results for orders placed or performed in visit on 01/10/23  Lipid panel  Result Value Ref Range   Cholesterol, Total 149 100 - 199 mg/dL   Triglycerides 409 (H) 0 - 149 mg/dL   HDL 42 >81 mg/dL   VLDL  Cholesterol Cal 38 5 - 40 mg/dL   LDL Chol Calc (NIH) 69 0 - 99 mg/dL   Chol/HDL Ratio 3.5 0.0 - 4.4 ratio  Hepatic function panel  Result Value Ref Range   Total Protein 7.0 6.0 - 8.5 g/dL   Albumin 4.1 3.8 - 4.9 g/dL   Bilirubin Total 0.3 0.0 - 1.2 mg/dL   Bilirubin, Direct 1.91 0.00 - 0.40 mg/dL   Alkaline Phosphatase 74 44 - 121 IU/L   AST 14 0 - 40 IU/L   ALT 12 0 - 32 IU/L  Basic metabolic panel  Result Value Ref Range   Glucose 125 (H) 70 - 99 mg/dL   BUN 13 6 - 24 mg/dL   Creatinine, Ser 4.78 0.57 - 1.00 mg/dL   eGFR 295 >62 ZH/YQM/5.78   BUN/Creatinine Ratio 19 9 - 23   Sodium 143 134 - 144 mmol/L   Potassium 4.1 3.5 - 5.2 mmol/L   Chloride 106 96 - 106 mmol/L   CO2 22 20 - 29 mmol/L   Calcium 9.0 8.7 - 10.2 mg/dL  Hemoglobin I6N  Result Value Ref Range   Hgb A1c MFr Bld 6.2 (H) 4.8 - 5.6 %   Est. average glucose Bld gHb Est-mCnc 131 mg/dL   Today's Vitals   62/95/28 0901  BP: 126/78  Pulse: 68  SpO2: 98%  Weight: 235 lb 6.4 oz (106.8 kg)  Height: 5\' 4"  (1.626 m)   Body mass index is 40.41 kg/m.        Assessment & Plan:   Problem List Items Addressed This Visit       Cardiovascular and Mediastinum   Essential hypertension, benign     Respiratory   Obstructive sleep apnea treated with continuous positive airway pressure (CPAP)     Digestive   Gastroesophageal reflux disease without esophagitis     Endocrine   Diabetic peripheral neuropathy (HCC) - Primary   Relevant Medications   tirzepatide (MOUNJARO) 7.5 MG/0.5ML Pen   Hyperlipidemia associated with type 2 diabetes mellitus (HCC)   Relevant Medications   tirzepatide (MOUNJARO) 7.5 MG/0.5ML Pen   Type 2 diabetes mellitus (HCC)   Relevant Medications   tirzepatide (MOUNJARO) 7.5 MG/0.5ML Pen   Other Visit Diagnoses     Chronic left-sided low back pain with left-sided sciatica       Relevant Orders   DG Lumbar Spine Complete      Continue current medications as directed.   Triglycerides remain slightly above normal but patient states she cannot tolerate omega-3 supplement.  Continue to work on activity as tolerated such as chair exercises and weight loss.  X-ray of the lumbar spine.  Recommend ice/heat applications and gentle stretching exercises.  Further follow-up based on x-ray results. Patient will be changing her insurance on 7/1 and she is unsure about the cost of her Mounjaro.  There is not enough time to titrate her dose of 5 mg so a dose of 7.5 mg 90-day supply will be sent into the pharmacy.  Patient understands that this may cause side effects. Meds ordered this encounter  Medications   tirzepatide (MOUNJARO) 7.5 MG/0.5ML Pen    Sig: Inject 7.5 mg into the skin once a week.    Dispense:  6 mL    Refill:  0    Order Specific Question:   Supervising Provider    Answer:   Lilyan Punt A [[id number]]   dapagliflozin propanediol (FARXIGA) 10 MG TABS tablet    Sig: Take 1 tablet (10 mg total) by mouth daily before breakfast.    Dispense:  90 tablet    Refill:  0    Order Specific Question:   Supervising Provider    Answer:   Lilyan Punt A [[id number]]  Continue nightly CPAP use. Return in about 3 months (around 04/27/2023).

## 2023-01-26 ENCOUNTER — Encounter: Payer: Self-pay | Admitting: Nurse Practitioner

## 2023-01-26 MED ORDER — TIRZEPATIDE 7.5 MG/0.5ML ~~LOC~~ SOAJ: 7.5000 mg | SUBCUTANEOUS | 0 refills | Status: DC

## 2023-01-26 MED ORDER — DAPAGLIFLOZIN PROPANEDIOL 10 MG PO TABS: 10.0000 mg | ORAL_TABLET | Freq: Every day | ORAL | 0 refills | Status: DC

## 2023-02-02 ENCOUNTER — Telehealth: Payer: Self-pay | Admitting: Family Medicine

## 2023-02-02 MED ORDER — TIRZEPATIDE 7.5 MG/0.5ML ~~LOC~~ SOAJ: 7.5000 mg | SUBCUTANEOUS | 0 refills | Status: DC

## 2023-02-02 NOTE — Telephone Encounter (Signed)
Patient has been informed.

## 2023-02-02 NOTE — Telephone Encounter (Signed)
Patient needs refill on mounjaro sent to [facility]. Phone-[phone]  Patient is completely out of diabetic medication she had to find a phrmacy who it it.

## 2023-02-09 ENCOUNTER — Ambulatory Visit: Payer: PRIVATE HEALTH INSURANCE | Admitting: Family Medicine

## 2023-02-09 ENCOUNTER — Other Ambulatory Visit: Payer: Self-pay | Admitting: Family Medicine

## 2023-02-18 ENCOUNTER — Other Ambulatory Visit: Payer: Self-pay | Admitting: Family Medicine

## 2023-03-12 ENCOUNTER — Other Ambulatory Visit (HOSPITAL_COMMUNITY): Payer: Self-pay | Admitting: Family Medicine

## 2023-03-12 DIAGNOSIS — Z1231 Encounter for screening mammogram for malignant neoplasm of breast: Secondary | ICD-10-CM

## 2023-03-26 ENCOUNTER — Encounter: Payer: Self-pay | Admitting: Nurse Practitioner

## 2023-03-26 ENCOUNTER — Other Ambulatory Visit: Payer: Self-pay | Admitting: Family Medicine

## 2023-03-30 ENCOUNTER — Other Ambulatory Visit: Payer: Self-pay | Admitting: Nurse Practitioner

## 2023-03-30 MED ORDER — TIRZEPATIDE 7.5 MG/0.5ML ~~LOC~~ SOAJ: 7.5000 mg | SUBCUTANEOUS | 0 refills | Status: DC

## 2023-04-24 ENCOUNTER — Other Ambulatory Visit: Payer: Self-pay | Admitting: Family Medicine

## 2023-04-25 ENCOUNTER — Other Ambulatory Visit: Payer: Self-pay

## 2023-04-25 DIAGNOSIS — E119 Type 2 diabetes mellitus without complications: Secondary | ICD-10-CM

## 2023-04-26 LAB — HEMOGLOBIN A1C
Est. average glucose Bld gHb Est-mCnc: 134 mg/dL
Hgb A1c MFr Bld: 6.3 % — ABNORMAL HIGH (ref 4.8–5.6)

## 2023-04-27 ENCOUNTER — Ambulatory Visit: Payer: No Typology Code available for payment source | Admitting: Nurse Practitioner

## 2023-04-27 VITALS — BP 148/80 | HR 77 | Temp 96.8°F | Ht 64.0 in | Wt 238.0 lb

## 2023-04-27 DIAGNOSIS — G4733 Obstructive sleep apnea (adult) (pediatric): Secondary | ICD-10-CM | POA: Diagnosis not present

## 2023-04-27 DIAGNOSIS — E119 Type 2 diabetes mellitus without complications: Secondary | ICD-10-CM | POA: Diagnosis not present

## 2023-04-27 DIAGNOSIS — E559 Vitamin D deficiency, unspecified: Secondary | ICD-10-CM | POA: Diagnosis not present

## 2023-04-27 DIAGNOSIS — Z79899 Other long term (current) drug therapy: Secondary | ICD-10-CM

## 2023-04-27 DIAGNOSIS — R5383 Other fatigue: Secondary | ICD-10-CM

## 2023-04-27 DIAGNOSIS — I10 Essential (primary) hypertension: Secondary | ICD-10-CM | POA: Diagnosis not present

## 2023-04-27 DIAGNOSIS — K219 Gastro-esophageal reflux disease without esophagitis: Secondary | ICD-10-CM

## 2023-04-27 MED ORDER — PANTOPRAZOLE SODIUM 40 MG PO TBEC: DELAYED_RELEASE_TABLET | ORAL | 1 refills | Status: DC

## 2023-04-27 NOTE — Progress Notes (Unsigned)
Subjective:    Patient ID: Susan Becker, female    DOB: [birth year], 62 y.o.   MRN: [id number]  HPI  3 month follow up diabetes, Genella Rife, hyperlipidemia  Diet: Pt does not cook at home often, reports eating out or convenience meals Activity: Pt reports decreased level of activity. 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 Encouraged using a step-counter and setting a daily goal of steps.  Sleep: Pt uses her CPAP regularly, averages 7 hours of sleep per night. Reports fatigue and low energy levels.  Pt reports regular dental and eye exams. She is scheduled for a mammogram next month.  Health Maintenance: Pt receives her flu shot at her job.   Past Medical History:  Diagnosis Date   Diabetes mellitus without complication (HCC)    GERD (gastroesophageal reflux disease)    HTN (hypertension) 09/2010   Impaired fasting glucose 04/2006   PCOS (polycystic ovarian syndrome)    per gyn   Sleep apnea     Current Outpatient Medications:    blood glucose meter kit and supplies KIT, Dispense based on patient and insurance preference. Use to check blood sugar BID. ICD 10 code E11.9, Disp: 1 each, Rfl: 0   dapagliflozin propanediol (FARXIGA) 10 MG TABS tablet, Take 1 tablet (10 mg total) by mouth daily before breakfast., Disp: 90 tablet, Rfl: 0   escitalopram (LEXAPRO) 20 MG tablet, TAKE 1 TABLET(20 MG) BY MOUTH DAILY, Disp: 90 tablet, Rfl: 1   glucose blood (CONTOUR NEXT TEST) test strip, USE TO CHECK BLOOD SUGAR DAILYUSE TO CHECK BLOOD SUGAR DAILY, Disp: 50 strip, Rfl: 10   insulin glargine (LANTUS SOLOSTAR) 100 UNIT/ML Solostar Pen, Inject 26 units into the skin daily., Disp: 15 mL, Rfl: 1   Insulin Pen Needle (PEN NEEDLES) 31G X 5 MM MISC, Use daily with insulin pen as directed, Disp: 100 each, Rfl: 0   losartan (COZAAR) 100 MG tablet, TAKE 1 TABLET(100 MG) BY MOUTH DAILY, Disp: 90 tablet, Rfl: 0   metFORMIN (GLUCOPHAGE) 1000 MG tablet, TAKE 1 TABLET(1000 MG) BY MOUTH TWICE DAILY  WITH A MEAL, Disp: 180 tablet, Rfl: 0   Multiple Vitamins-Minerals (MULTIVITAMIN WITH MINERALS) tablet, Take 1 tablet by mouth daily., Disp: , Rfl:    rosuvastatin (CRESTOR) 20 MG tablet, TAKE 1 TABLET(20 MG) BY MOUTH DAILY, Disp: 90 tablet, Rfl: 0   tirzepatide (MOUNJARO) 7.5 MG/0.5ML Pen, Inject 7.5 mg into the skin once a week., Disp: 6 mL, Rfl: 0   zinc gluconate 50 MG tablet, Take 50 mg by mouth daily., Disp: , Rfl:    pantoprazole (PROTONIX) 40 MG tablet, TAKE 1 TABLET(40 MG) BY MOUTH DAILY FOR STOMACH ACID OR REFLUX, Disp: 90 tablet, Rfl: 1    Review of Systems  Constitutional:  Positive for activity change and fatigue.  HENT:  Negative for sore throat and trouble swallowing.   Respiratory:  Negative for shortness of breath and wheezing.   Cardiovascular:  Negative for chest pain.  Gastrointestinal:  Negative for abdominal pain, constipation, diarrhea and nausea.  Genitourinary:  Negative for menstrual problem and vaginal bleeding.  Psychiatric/Behavioral:  The patient is not nervous/anxious.        Objective:   Physical Exam Constitutional:      Appearance: Normal appearance. She is not ill-appearing.  Cardiovascular:     Rate and Rhythm: Normal rate and regular rhythm.     Heart sounds: Normal heart sounds.  Pulmonary:     Breath sounds: Normal breath sounds.  Abdominal:     Palpations: Abdomen is soft.     Tenderness: There is no abdominal tenderness.  Skin:    General: Skin is warm and dry.  Psychiatric:        Mood and Affect: Mood normal.    Results for orders placed or performed in visit on 04/25/23  Hemoglobin A1c  Result Value Ref Range   Hgb A1c MFr Bld 6.3 (H) 4.8 - 5.6 %   Est. average glucose Bld gHb Est-mCnc 134 mg/dL     Vitals:   16/10/96 0831  BP: (!) 148/76  Pulse: 77  Temp: (!) 96.8 F (36 C)  SpO2: 98%         Assessment & Plan:   Problem List Items Addressed This Visit       Cardiovascular and Mediastinum   Essential  hypertension, benign     Respiratory   Obstructive sleep apnea treated with continuous positive airway pressure (CPAP)     Endocrine   Diabetes mellitus without complication (HCC) - Primary     Other   Vitamin D deficiency   Relevant Orders   VITAMIN D 25 Hydroxy (Vit-D Deficiency, Fractures)   Other Visit Diagnoses     Fatigue, unspecified type       Relevant Orders   CBC with Differential/Platelet   TSH   Vitamin B12   VITAMIN D 25 Hydroxy (Vit-D Deficiency, Fractures)   Iron, TIBC and Ferritin Panel   High risk medication use       Relevant Orders   Vitamin B12      Meds ordered this encounter  Medications   pantoprazole (PROTONIX) 40 MG tablet    Sig: TAKE 1 TABLET(40 MG) BY MOUTH DAILY FOR STOMACH ACID OR REFLUX    Dispense:  90 tablet    Refill:  1   Return in about 3 months (around 07/27/2023).

## 2023-04-28 LAB — VITAMIN B12: Vitamin B-12: 298 pg/mL (ref 232–1245)

## 2023-04-28 LAB — CBC WITH DIFFERENTIAL/PLATELET
Basophils Absolute: 0 10*3/uL (ref 0.0–0.2)
Basos: 1 %
EOS (ABSOLUTE): 0.2 10*3/uL (ref 0.0–0.4)
Eos: 3 %
Hematocrit: 38.1 % (ref 34.0–46.6)
Hemoglobin: 12.1 g/dL (ref 11.1–15.9)
Immature Grans (Abs): 0 10*3/uL (ref 0.0–0.1)
Immature Granulocytes: 0 %
Lymphocytes Absolute: 2 10*3/uL (ref 0.7–3.1)
Lymphs: 29 %
MCH: 29.2 pg (ref 26.6–33.0)
MCHC: 31.8 g/dL (ref 31.5–35.7)
MCV: 92 fL (ref 79–97)
Monocytes Absolute: 0.5 10*3/uL (ref 0.1–0.9)
Monocytes: 7 %
Neutrophils Absolute: 4.1 10*3/uL (ref 1.4–7.0)
Neutrophils: 60 %
Platelets: 275 10*3/uL (ref 150–450)
RBC: 4.15 x10E6/uL (ref 3.77–5.28)
RDW: 13.1 % (ref 11.7–15.4)
WBC: 6.8 10*3/uL (ref 3.4–10.8)

## 2023-04-28 LAB — TSH: TSH: 2.82 u[IU]/mL (ref 0.450–4.500)

## 2023-04-28 LAB — IRON,TIBC AND FERRITIN PANEL
Ferritin: 58 ng/mL (ref 15–150)
Iron Saturation: 23 % (ref 15–55)
Iron: 68 ug/dL (ref 27–159)
Total Iron Binding Capacity: 301 ug/dL (ref 250–450)
UIBC: 233 ug/dL (ref 131–425)

## 2023-04-28 LAB — VITAMIN D 25 HYDROXY (VIT D DEFICIENCY, FRACTURES): Vit D, 25-Hydroxy: 30 ng/mL (ref 30.0–100.0)

## 2023-04-30 ENCOUNTER — Encounter: Payer: Self-pay | Admitting: Nurse Practitioner

## 2023-04-30 NOTE — Progress Notes (Signed)
   Subjective:    Patient ID: Susan Becker, female    DOB: 03/12/1961, 62 y.o.   MRN: [id number]  HPI    Review of Systems     Objective:   Physical Exam        Assessment & Plan:

## 2023-05-09 ENCOUNTER — Telehealth: Payer: Self-pay

## 2023-05-09 NOTE — Telephone Encounter (Signed)
Prescription Request  05/09/2023  LOV: Visit date not found  What is the name of the medication or equipment? dapagliflozin propanediol (FARXIGA) 10 MG TABS tablet   Have you contacted your pharmacy to request a refill? Yes   Which pharmacy would you like this sent to? [facility] [address]   Patient notified that their request is being sent to the clinical staff for review and that they should receive a response within 2 business days.   Please advise at Mobile [phone] (mobile)

## 2023-05-10 ENCOUNTER — Other Ambulatory Visit: Payer: Self-pay | Admitting: Nurse Practitioner

## 2023-05-10 MED ORDER — DAPAGLIFLOZIN PROPANEDIOL 10 MG PO TABS: 10.0000 mg | ORAL_TABLET | Freq: Every day | ORAL | 1 refills | Status: DC

## 2023-05-21 ENCOUNTER — Other Ambulatory Visit: Payer: Self-pay | Admitting: Family Medicine

## 2023-05-24 ENCOUNTER — Other Ambulatory Visit: Payer: Self-pay | Admitting: Family Medicine

## 2023-05-24 ENCOUNTER — Ambulatory Visit (INDEPENDENT_AMBULATORY_CARE_PROVIDER_SITE_OTHER): Payer: 59 | Admitting: Nurse Practitioner

## 2023-05-24 VITALS — BP 133/83 | HR 74 | Temp 98.4°F | Ht 64.76 in | Wt 240.4 lb

## 2023-05-24 DIAGNOSIS — B3731 Acute candidiasis of vulva and vagina: Secondary | ICD-10-CM

## 2023-05-24 DIAGNOSIS — Z01411 Encounter for gynecological examination (general) (routine) with abnormal findings: Secondary | ICD-10-CM | POA: Diagnosis not present

## 2023-05-24 DIAGNOSIS — E119 Type 2 diabetes mellitus without complications: Secondary | ICD-10-CM | POA: Diagnosis not present

## 2023-05-24 DIAGNOSIS — Z01419 Encounter for gynecological examination (general) (routine) without abnormal findings: Secondary | ICD-10-CM

## 2023-05-24 MED ORDER — FLUCONAZOLE 150 MG PO TABS: ORAL_TABLET | ORAL | 0 refills | Status: DC

## 2023-05-24 NOTE — Progress Notes (Signed)
Subjective:    Patient ID: Susan Becker, female    DOB: 1961-05-22, 62 y.o.   MRN: [id number]  HPI The patient comes in today for a wellness visit.    A review of their health history was completed.  A review of medications was also completed.  Any needed refills; NO  Eating habits: Good. Reports eating a mostly well-balanced diet and prepares meals at home.   Falls/  MVA accidents in past few months: No  Regular exercise: Yes, walking several days per week.   Specialist pt sees on regular basis: no  Preventative health issues were discussed. She will receive her influenza vaccine through her employer.   Additional concerns: Has complaints of vaginal itching with white, thick discharge over the last several weeks. She is sexually active, no new partners over the last year. She is postmenopausal.   She monitors her blood glucose at home, usually with fasting numbers between 150-200.      Review of Systems  Constitutional:  Negative for activity change, appetite change, fatigue and fever.  HENT:  Negative for sore throat and trouble swallowing.   Respiratory:  Negative for cough, chest tightness, shortness of breath and wheezing.   Cardiovascular:  Negative for chest pain.  Gastrointestinal:  Negative for abdominal distention, abdominal pain, constipation, diarrhea, nausea and vomiting.  Genitourinary:  Positive for vaginal discharge (white, thick discharge with itching). Negative for difficulty urinating, dysuria, enuresis, frequency, genital sores, hematuria, menstrual problem, pelvic pain, urgency, vaginal bleeding and vaginal pain.      05/24/2023    9:56 AM 04/27/2023    8:42 AM 01/25/2023    9:03 AM  PHQ9 SCORE ONLY  PHQ-9 Total Score 1 1 0      05/24/2023    9:57 AM 04/27/2023    8:43 AM 01/25/2023    9:04 AM 11/30/2022    4:03 PM  GAD 7 : Generalized Anxiety Score  Nervous, Anxious, on Edge 0 0 0 0  Control/stop worrying 0 0 0 0  Worry too much - different things  1 0 0 0  Trouble relaxing 0 0 0 0  Restless 0 0 0 0  Easily annoyed or irritable 0 0 0 0  Afraid - awful might happen 0 0 0 0  Total GAD 7 Score 1 0 0 0  Anxiety Difficulty Not difficult at all Not difficult at all            Objective:   Physical Exam Vitals and nursing note reviewed. Exam conducted with a chaperone present.  Constitutional:      General: She is not in acute distress.    Appearance: Normal appearance. She is well-developed. She is obese. She is not ill-appearing.  Neck:     Thyroid: No thyromegaly.     Trachea: No tracheal deviation.     Comments: Thyroid non tender to palpation. No mass or goiter noted.  Cardiovascular:     Rate and Rhythm: Normal rate and regular rhythm.     Heart sounds: Normal heart sounds. No murmur heard. Pulmonary:     Effort: Pulmonary effort is normal.     Breath sounds: Normal breath sounds. No wheezing.  Chest:  Breasts:    Right: Normal. No swelling, inverted nipple, mass, nipple discharge, skin change or tenderness.     Left: Normal. No swelling, inverted nipple, mass, nipple discharge, skin change or tenderness.  Abdominal:     General: There is no distension.     Palpations:  Abdomen is soft. There is no mass.     Tenderness: There is no abdominal tenderness.  Genitourinary:    General: Normal vulva.     Exam position: Lithotomy position.     Pubic Area: No rash.      Vagina: Vaginal discharge (minimal amount of white discharge) and erythema present.     Cervix: No discharge, lesion, erythema or cervical bleeding.  Musculoskeletal:     Cervical back: Normal range of motion and neck supple.  Lymphadenopathy:     Cervical: No cervical adenopathy.     Upper Body:     Right upper body: No supraclavicular, axillary or pectoral adenopathy.     Left upper body: No supraclavicular, axillary or pectoral adenopathy.  Skin:    General: Skin is warm and dry.     Findings: No rash.  Neurological:     Mental Status: She is alert  and oriented to person, place, and time.  Psychiatric:        Mood and Affect: Mood normal.        Behavior: Behavior normal.        Thought Content: Thought content normal.        Judgment: Judgment normal.    Vitals:   05/24/23 0938  BP: 133/83  Pulse: 74  Temp: 98.4 F (36.9 C)  SpO2: 98%   Diabetic Foot Exam - Simple   Simple Foot Form Diabetic Foot exam was performed with the following findings: Yes 05/24/2023 10:00 AM  Visual Inspection No deformities, no ulcerations, no other skin breakdown bilaterally: Yes Sensation Testing Intact to touch and monofilament testing bilaterally: Yes Pulse Check See comments: Yes Comments DP pulses present bilaterally. Toes warm with normal capillary refill.             Assessment & Plan:  1. Diabetes mellitus without complication (HCC) - Microalbumin/Creatinine Ratio, Urine -Continue monitoring BG at home -Eat a well-balanced diet with consideration to sugar and carbohydrate intake.   2. Well woman exam -Importance of diet and exercise were discussed in detail.  Importance of stress reduction and healthy living were discussed.  In addition to this a discussion regarding safety was also covered.  We also reviewed over immunizations and gave recommendations regarding current immunizations needed for age.   Colonoscopy due in 2026 and pap smear UTD. She has a mammogram scheduled for the end of this month.   Patient was advised yearly wellness exam.    3. Vaginal candidiasis -Ordered Diflucan 150 mg tablet. Call back if persists.   Meds ordered this encounter  Medications   fluconazole (DIFLUCAN) 150 MG tablet    Sig: One po qd prn yeast infection; may repeat in 3-4 days if needed    Dispense:  2 tablet    Refill:  0    Order Specific Question:   Supervising Provider    Answer:   Babs Sciara [[id number]]   Return in about 3 months (around 08/24/2023).

## 2023-05-25 ENCOUNTER — Encounter: Payer: Self-pay | Admitting: Nurse Practitioner

## 2023-05-25 LAB — MICROALBUMIN / CREATININE URINE RATIO
Creatinine, Urine: 84.7 mg/dL
Microalb/Creat Ratio: 5 mg/g{creat} (ref 0–29)
Microalbumin, Urine: 4.3 ug/mL

## 2023-05-25 NOTE — Progress Notes (Signed)
   Subjective:    Patient ID: Susan Becker, female    DOB: 03/12/1961, 62 y.o.   MRN: [id number]  HPI    Review of Systems     Objective:   Physical Exam        Assessment & Plan:

## 2023-06-18 ENCOUNTER — Ambulatory Visit (HOSPITAL_COMMUNITY)
Admission: RE | Admit: 2023-06-18 | Discharge: 2023-06-18 | Disposition: A | Discharge status: Home / Self Care | Payer: 59 | Source: Ambulatory Visit | Attending: Family Medicine | Admitting: Family Medicine

## 2023-06-18 DIAGNOSIS — Z1231 Encounter for screening mammogram for malignant neoplasm of breast: Secondary | ICD-10-CM | POA: Diagnosis present

## 2023-06-21 ENCOUNTER — Other Ambulatory Visit: Payer: Self-pay | Admitting: Family Medicine

## 2023-06-26 LAB — HM DIABETES EYE EXAM

## 2023-06-27 ENCOUNTER — Other Ambulatory Visit: Payer: Self-pay | Admitting: Family Medicine

## 2023-07-06 ENCOUNTER — Encounter: Payer: Self-pay | Admitting: Nurse Practitioner

## 2023-07-06 ENCOUNTER — Other Ambulatory Visit: Payer: Self-pay

## 2023-07-06 MED ORDER — TIRZEPATIDE 10 MG/0.5ML ~~LOC~~ SOAJ: 10.0000 mg | SUBCUTANEOUS | 3 refills | Status: DC

## 2023-07-06 NOTE — Telephone Encounter (Signed)
May go ahead and go up on the [facility] to 10 mg once weekly, 1 month supply with 3 additional refills  Recommend follow-up office visit with Eber Jones or myself no later than March or early April Would be wise to go ahead and schedule the appointment because they fill up ahead of time

## 2023-07-26 ENCOUNTER — Other Ambulatory Visit: Payer: Self-pay | Admitting: Family Medicine

## 2023-07-30 ENCOUNTER — Other Ambulatory Visit: Payer: Self-pay | Admitting: Nurse Practitioner

## 2023-07-30 DIAGNOSIS — E1169 Type 2 diabetes mellitus with other specified complication: Secondary | ICD-10-CM

## 2023-07-30 DIAGNOSIS — E119 Type 2 diabetes mellitus without complications: Secondary | ICD-10-CM

## 2023-07-31 ENCOUNTER — Ambulatory Visit: Payer: 59 | Admitting: Nurse Practitioner

## 2023-07-31 ENCOUNTER — Encounter: Payer: Self-pay | Admitting: Nurse Practitioner

## 2023-07-31 VITALS — BP 134/80 | HR 80 | Temp 97.2°F | Ht 64.76 in | Wt 236.0 lb

## 2023-07-31 DIAGNOSIS — Z7984 Long term (current) use of oral hypoglycemic drugs: Secondary | ICD-10-CM | POA: Diagnosis not present

## 2023-07-31 DIAGNOSIS — E1169 Type 2 diabetes mellitus with other specified complication: Secondary | ICD-10-CM

## 2023-07-31 DIAGNOSIS — E785 Hyperlipidemia, unspecified: Secondary | ICD-10-CM | POA: Diagnosis not present

## 2023-07-31 DIAGNOSIS — E119 Type 2 diabetes mellitus without complications: Secondary | ICD-10-CM

## 2023-07-31 DIAGNOSIS — I10 Essential (primary) hypertension: Secondary | ICD-10-CM | POA: Diagnosis not present

## 2023-07-31 LAB — LIPID PANEL
Chol/HDL Ratio: 2.9 {ratio} (ref 0.0–4.4)
Cholesterol, Total: 139 mg/dL (ref 100–199)
HDL: 48 mg/dL (ref >39)
LDL Chol Calc (NIH): 65 mg/dL (ref 0–99)
Triglycerides: 151 mg/dL — ABNORMAL HIGH (ref 0–149)
VLDL Cholesterol Cal: 26 mg/dL (ref 5–40)

## 2023-07-31 LAB — HEMOGLOBIN A1C
Est. average glucose Bld gHb Est-mCnc: 128 mg/dL
Hgb A1c MFr Bld: 6.1 % — ABNORMAL HIGH (ref 4.8–5.6)

## 2023-07-31 MED ORDER — DAPAGLIFLOZIN PROPANEDIOL 10 MG PO TABS: 10.0000 mg | ORAL_TABLET | Freq: Every day | ORAL | 1 refills | Status: DC

## 2023-07-31 NOTE — Progress Notes (Signed)
Subjective:    Patient ID: Susan Becker, female    DOB: 03-Apr-1961, 62 y.o.   MRN: [id number]  HPI Presents for routine follow-up on diabetes and hypertension.  Decreased appetite on the Mounjaro 10 mg.  Denies any adverse effects.  No nausea or vomiting.  Has been eating smaller portions.  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  Wearing her CPAP nightly.  Takes multivitamin daily.   Review of Systems  Respiratory:  Negative for cough, chest tightness, shortness of breath and wheezing.   Cardiovascular:  Negative for chest pain and leg swelling.  Gastrointestinal:  Negative for abdominal pain, constipation, diarrhea, nausea and vomiting.      07/31/2023    8:54 AM  Depression screen PHQ 2/9  Decreased Interest 0  Down, Depressed, Hopeless 0  PHQ - 2 Score 0  Altered sleeping 0  Tired, decreased energy 1  Change in appetite 0  Feeling bad or failure about yourself  0  Trouble concentrating 0  Moving slowly or fidgety/restless 0  Suicidal thoughts 0  PHQ-9 Score 1  Difficult doing work/chores Not difficult at all      07/31/2023    8:54 AM 05/24/2023    9:57 AM 04/27/2023    8:43 AM 01/25/2023    9:04 AM  GAD 7 : Generalized Anxiety Score  Nervous, Anxious, on Edge 0 0 0 0  Control/stop worrying 0 0 0 0  Worry too much - different things 0 1 0 0  Trouble relaxing 0 0 0 0  Restless 0 0 0 0  Easily annoyed or irritable 0 0 0 0  Afraid - awful might happen 0 0 0 0  Total GAD 7 Score 0 1 0 0  Anxiety Difficulty Not difficult at all Not difficult at all Not difficult at all          Objective:   Physical Exam NAD.  Alert, oriented.  Lungs clear.  Heart regular rate rhythm. Results for orders placed or performed in visit on 07/30/23  Hemoglobin A1c  Result Value Ref Range   Hgb A1c MFr Bld 6.1 (H) 4.8 - 5.6 %   Est. average glucose Bld gHb Est-mCnc 128 mg/dL  Lipid panel  Result Value Ref Range   Cholesterol, Total 139 100 - 199 mg/dL    Triglycerides 875 (H) 0 - 149 mg/dL   HDL 48 >64 mg/dL   VLDL Cholesterol Cal 26 5 - 40 mg/dL   LDL Chol Calc (NIH) 65 0 - 99 mg/dL   Chol/HDL Ratio 2.9 0.0 - 4.4 ratio   Reviewed lab work with patient during visit. Today's Vitals   07/31/23 0843  BP: 134/80  Pulse: 80  Temp: (!) 97.2 F (36.2 C)  SpO2: 97%  Weight: 236 lb (107 kg)  Height: 5' 4.76" (1.645 m)   Body mass index is 39.56 kg/m.        Assessment & Plan:   Problem List Items Addressed This Visit       Cardiovascular and Mediastinum   Essential hypertension, benign     Endocrine   Diabetes mellitus without complication (HCC) - Primary   Relevant Medications   dapagliflozin propanediol (FARXIGA) 10 MG TABS tablet   Hyperlipidemia associated with type 2 diabetes mellitus (HCC)   Relevant Medications   dapagliflozin propanediol (FARXIGA) 10 MG TABS tablet   Meds ordered this encounter  Medications   dapagliflozin propanediol (FARXIGA) 10 MG TABS tablet  Sig: Take 1 tablet (10 mg total) by mouth daily before breakfast.    Dispense:  90 tablet    Refill:  1   Continue current regimen as directed. Encouraged increased protein in her diet and muscle strengthening exercises to reduce risk of muscle loss. Return in about 3 months (around 10/29/2023).

## 2023-08-03 ENCOUNTER — Ambulatory Visit: Payer: 59 | Admitting: Nurse Practitioner

## 2023-08-07 ENCOUNTER — Ambulatory Visit: Payer: 59 | Admitting: Nurse Practitioner

## 2023-08-13 ENCOUNTER — Telehealth: Payer: Self-pay | Admitting: Family Medicine

## 2023-08-13 MED ORDER — ESCITALOPRAM OXALATE 20 MG PO TABS: ORAL_TABLET | ORAL | 0 refills | Status: DC

## 2023-08-13 NOTE — Telephone Encounter (Signed)
Refill on  escitalopram (LEXAPRO) 20 MG tablet  send to [facility]

## 2023-08-13 NOTE — Telephone Encounter (Signed)
Received via fax Rx request: Prescription sent electronically to pharmacy  

## 2023-08-19 ENCOUNTER — Other Ambulatory Visit: Payer: Self-pay | Admitting: Family Medicine

## 2023-09-14 ENCOUNTER — Telehealth: Payer: Self-pay | Admitting: Family Medicine

## 2023-09-14 ENCOUNTER — Other Ambulatory Visit: Payer: Self-pay

## 2023-09-14 MED ORDER — LANTUS SOLOSTAR 100 UNIT/ML ~~LOC~~ SOPN: PEN_INJECTOR | SUBCUTANEOUS | 1 refills | Status: DC

## 2023-09-14 NOTE — Telephone Encounter (Signed)
Refill on insulin glargine (LANTUS SOLOSTAR) 100 UNIT/ML Solostar Pen  [facility]

## 2023-09-27 ENCOUNTER — Other Ambulatory Visit: Payer: Self-pay | Admitting: Family Medicine

## 2023-10-15 ENCOUNTER — Encounter: Payer: Self-pay | Admitting: Family Medicine

## 2023-10-25 ENCOUNTER — Other Ambulatory Visit: Payer: Self-pay | Admitting: Family Medicine

## 2023-10-25 ENCOUNTER — Telehealth: Payer: Self-pay | Admitting: Family Medicine

## 2023-10-25 NOTE — Telephone Encounter (Signed)
 Refill on pantoprazole (PROTONIX) 40 MG tablet  send to [facility]

## 2023-10-26 ENCOUNTER — Telehealth: Payer: Self-pay | Admitting: *Deleted

## 2023-10-26 MED ORDER — PANTOPRAZOLE SODIUM 40 MG PO TBEC: DELAYED_RELEASE_TABLET | ORAL | 0 refills | Status: DC

## 2023-10-26 NOTE — Telephone Encounter (Signed)
 Copied from CRM [id number]. Topic: Clinical - Request for Lab/Test Order >> Oct 26, 2023 10:59 AM Geroge Baseman wrote: Reason for CRM: Patient wanting to know if she can get labs ordered before her appointment with dr Gerda Diss. Please call to advise when the orders have been placed.

## 2023-10-26 NOTE — Telephone Encounter (Signed)
Received via fax Rx request: Prescription sent electronically to pharmacy  

## 2023-10-27 ENCOUNTER — Other Ambulatory Visit: Payer: Self-pay

## 2023-10-27 ENCOUNTER — Emergency Department (HOSPITAL_COMMUNITY)

## 2023-10-27 ENCOUNTER — Emergency Department (HOSPITAL_COMMUNITY)
Admission: EM | Admit: 2023-10-27 | Discharge: 2023-10-27 | Disposition: A | Discharge status: Home / Self Care | Attending: Emergency Medicine | Admitting: Emergency Medicine

## 2023-10-27 ENCOUNTER — Encounter (HOSPITAL_COMMUNITY): Payer: Self-pay

## 2023-10-27 DIAGNOSIS — W228XXA Striking against or struck by other objects, initial encounter: Secondary | ICD-10-CM | POA: Diagnosis not present

## 2023-10-27 DIAGNOSIS — Y9301 Activity, walking, marching and hiking: Secondary | ICD-10-CM | POA: Insufficient documentation

## 2023-10-27 DIAGNOSIS — S92412A Displaced fracture of proximal phalanx of left great toe, initial encounter for closed fracture: Secondary | ICD-10-CM | POA: Insufficient documentation

## 2023-10-27 DIAGNOSIS — Y92009 Unspecified place in unspecified non-institutional (private) residence as the place of occurrence of the external cause: Secondary | ICD-10-CM | POA: Diagnosis not present

## 2023-10-27 DIAGNOSIS — M79675 Pain in left toe(s): Secondary | ICD-10-CM | POA: Diagnosis present

## 2023-10-27 LAB — CBG MONITORING, ED: Glucose-Capillary: 78 mg/dL (ref 70–99)

## 2023-10-27 MED ORDER — OXYCODONE-ACETAMINOPHEN 5-325 MG PO TABS: 1.0000 | ORAL_TABLET | Freq: Four times a day (QID) | ORAL | 0 refills | Status: DC | PRN

## 2023-10-27 MED ORDER — OXYCODONE-ACETAMINOPHEN 5-325 MG PO TABS
1.0000 | ORAL_TABLET | Freq: Once | ORAL | Status: AC
  Administered 2023-10-27: 1 via ORAL
  Filled 2023-10-27: qty 1

## 2023-10-27 NOTE — ED Provider Notes (Signed)
 [place] EMERGENCY DEPARTMENT AT [facility] Provider Note   CSN: [id number] Arrival date & time: 10/27/23  1056     History Chief Complaint  Patient presents with   Toe Injury    Susan Becker is a 63 y.o. female.  Patient presents the emergency department concerns of a toe injury.  She states that she is walking up steps at her home when she kicked her toe against the space between the steps resulting in the injury.  She denies any falls, rolling of the ankle, or other injuries associated with this.  No medications taken prior to arriving.  She states that she can tolerate some weightbearing although uncomfortable.  Mobility limited due to pain.  Not on blood thinners.  HPI     Home Medications Prior to Admission medications   Medication Sig Start Date End Date Taking? Authorizing Provider  oxyCODONE-acetaminophen (PERCOCET/ROXICET) 5-325 MG tablet Take 1 tablet by mouth every 6 (six) hours as needed for severe pain (pain score 7-10). 10/27/23  Yes Gabrella Stroh A, PA-C  blood glucose meter kit and supplies KIT Dispense based on patient and insurance preference. Use to check blood sugar BID. ICD 10 code E11.9 01/03/19   Babs Sciara, MD  dapagliflozin propanediol (FARXIGA) 10 MG TABS tablet Take 1 tablet (10 mg total) by mouth daily before breakfast. 07/31/23   Campbell Riches, NP  escitalopram (LEXAPRO) 20 MG tablet TAKE 1 TABLET(20 MG) BY MOUTH DAILY 08/13/23   Babs Sciara, MD  fluconazole (DIFLUCAN) 150 MG tablet One po qd prn yeast infection; may repeat in 3-4 days if needed 05/24/23   Campbell Riches, NP  glucose blood (CONTOUR NEXT TEST) test strip USE TO CHECK BLOOD SUGAR DAILYUSE TO CHECK BLOOD SUGAR DAILY 03/16/21   Babs Sciara, MD  insulin glargine (LANTUS SOLOSTAR) 100 UNIT/ML Solostar Pen ADMINISTER 26 UNITS UNDER THE SKIN DAILY 09/14/23   Campbell Riches, NP  Insulin Pen Needle (PEN NEEDLES) 31G X 5 MM MISC Use daily with insulin pen as directed  12/16/21   Campbell Riches, NP  losartan (COZAAR) 100 MG tablet TAKE 1 TABLET(100 MG) BY MOUTH DAILY 08/20/23   Babs Sciara, MD  metFORMIN (GLUCOPHAGE) 1000 MG tablet TAKE 1 TABLET(1000 MG) BY MOUTH TWICE DAILY WITH A MEAL 10/25/23   Babs Sciara, MD  Multiple Vitamins-Minerals (MULTIVITAMIN WITH MINERALS) tablet Take 1 tablet by mouth daily.    [provider]  pantoprazole (PROTONIX) 40 MG tablet TAKE 1 TABLET(40 MG) BY MOUTH DAILY FOR STOMACH ACID OR REFLUX 10/26/23   Campbell Riches, NP  rosuvastatin (CRESTOR) 20 MG tablet TAKE 1 TABLET(20 MG) BY MOUTH DAILY 09/27/23   Babs Sciara, MD  tirzepatide [facility]) 10 MG/0.5ML Pen Inject 10 mg into the skin once a week. 07/06/23   Babs Sciara, MD  zinc gluconate 50 MG tablet Take 50 mg by mouth daily.    [provider]      Allergies    Zithromax [azithromycin] and Trulicity [dulaglutide]    Review of Systems   Review of Systems  Musculoskeletal:  Positive for joint swelling.  All other systems reviewed and are negative.   Physical Exam Updated Vital Signs BP 122/73   Pulse 72   Temp 98.4 F (36.9 C) (Oral)   Ht 5\' 6"  (1.676 m)   Wt 102.1 kg   LMP 06/21/2013   SpO2 99%   BMI 36.32 kg/m  Physical Exam Vitals and  nursing note reviewed.  Constitutional:      General: She is not in acute distress.    Appearance: She is well-developed.  HENT:     Head: Normocephalic and atraumatic.  Eyes:     Conjunctiva/sclera: Conjunctivae normal.  Cardiovascular:     Rate and Rhythm: Normal rate and regular rhythm.     Heart sounds: No murmur heard. Pulmonary:     Effort: Pulmonary effort is normal. No respiratory distress.     Breath sounds: Normal breath sounds.  Abdominal:     Palpations: Abdomen is soft.     Tenderness: There is no abdominal tenderness.  Musculoskeletal:        General: Tenderness and signs of injury present. No swelling.     Cervical back: Neck supple.       Legs:     Comments:  Left great toe with some mild swelling and tenderness at the proximal phalanx.  No significant bruising or visible deformity.  Range of motion limited due to pain.  Able to passively move toe with discomfort.  Denies any loss of sensation or color to the foot.  Neurovascular intact.  Skin:    General: Skin is warm and dry.     Capillary Refill: Capillary refill takes less than 2 seconds.  Neurological:     Mental Status: She is alert.  Psychiatric:        Mood and Affect: Mood normal.     ED Results / Procedures / Treatments   Labs (all labs ordered are listed, but only abnormal results are displayed) Labs Reviewed  CBG MONITORING, ED    EKG None  Radiology DG Toe Great Left Result Date: 10/27/2023 CLINICAL DATA:  trauma EXAM: LEFT GREAT TOE COMPARISON:  None Available. FINDINGS: Osteopenia. There is a comminuted fracture of the first proximal phalanx. There is associated soft tissue edema. There is mild dorsal displacement of the distal fragment. No definitive intra-articular extension. Mild to moderate degenerative changes of the first MTP. No unexpected radiopaque foreign body. IMPRESSION: Comminuted fracture of the first proximal phalanx. Electronically Signed   By: Meda Klinefelter M.D.   On: 10/27/2023 13:16    Procedures Procedures   Medications Ordered in ED Medications  oxyCODONE-acetaminophen (PERCOCET/ROXICET) 5-325 MG per tablet 1 tablet (1 tablet Oral Given 10/27/23 1345)    ED Course/ Medical Decision Making/ A&P                                 Medical Decision Making Amount and/or Complexity of Data Reviewed Radiology: ordered.  Risk Prescription drug management.   This patient presents to the ED for concern of toe injury.  Differential diagnosis includes toe dislocation, phalanx fracture, Lisfranc injury, plantar fasciitis   Imaging Studies ordered:  I ordered imaging studies including x-ray of the left great toe I independently visualized and  interpreted imaging which showed Comminuted fracture of the first proximal phalanx.   I agree with the radiologist interpretation   Medicines ordered and prescription drug management:  I ordered medication including Percocet for pain Reevaluation of the patient after these medicines showed that the patient improved I have reviewed the patients home medicines and have made adjustments as needed   Problem List / ED Course:  Patient presents to the emergency department with concerns of a toe injury.  She reports that she was walking up steps at her home when she tripped striking her foot against the  space between the steps.  She said that she had notable immediate pain.  Has some difficulty with weightbearing although able to tolerate this.  Concern about swelling to the left great toe but denies any nail trauma and denies any significant bruising. Physical exam does reveal some swelling to the proximal phalanx of the left toe.  No significant bruising, or visible deformity.  Nail uninvolved.  No subungual hematoma.  Range of motion limited due to pain.  Tenderness to palpation primarily over the proximal phalanx is uncomfortable.  Neurovascularly intact. X-rays has concerns for comminuted fracture of the first proximal phalanx of the left great toe.  Given this finding, placed patient into a walking boot and provide crutches for ambulation as tolerated.  Advised weightbearing as tolerated.  Will send a prescription for Percocet for pain management at home.  Encourage close follow-up with orthopedic surgery.  Otherwise encouraged Tylenol and ibuprofen for mild-moderate pain.  Discussed return precautions such as new or worsening symptoms, or repeat injury. Patient given contact pronation for orthopedics for outpatient follow-up.  Patient otherwise stable with all questions answered.  Discharged home in stable condition.  Final Clinical Impression(s) / ED Diagnoses Final diagnoses:  Closed displaced  fracture of proximal phalanx of left great toe, initial encounter    Rx / DC Orders ED Discharge Orders          Ordered    oxyCODONE-acetaminophen (PERCOCET/ROXICET) 5-325 MG tablet  Every 6 hours PRN        10/27/23 1409              Smitty Knudsen, PA-C 10/27/23 1416    Terrilee Files, MD 10/27/23 1759

## 2023-10-27 NOTE — ED Triage Notes (Signed)
 Pt stated that she tripped going up steps and hit her left greater toe on a brick wall. Obvious swelling present. Very limited mobility.

## 2023-10-27 NOTE — Discharge Instructions (Addendum)
 You were seen in the ER today for concerns of a toe injury.  Your x-ray did reveal signs of what is called a comminuted fracture of the proximal phalanx of the left great toe.  You are placed into a hard soled boot, and given crutches for stability with walking.  You can put weight on this toe as tolerated although pain is so uncomfortable, you may use her crutches.  You should follow-up with the orthopedic surgeon for repeat evaluation.  For any concerns of new or worsening symptoms, return the emergency department.  I have sent a prescription for Percocet to your pharmacy for pain control.  Please take this for severe pain.  For any concerns of mild to moderate pain, you may take Tylenol or ibuprofen.

## 2023-10-29 ENCOUNTER — Other Ambulatory Visit: Payer: Self-pay

## 2023-10-29 ENCOUNTER — Encounter: Payer: Self-pay | Admitting: *Deleted

## 2023-10-29 DIAGNOSIS — Z79899 Other long term (current) drug therapy: Secondary | ICD-10-CM

## 2023-10-29 DIAGNOSIS — E1169 Type 2 diabetes mellitus with other specified complication: Secondary | ICD-10-CM

## 2023-10-29 DIAGNOSIS — I10 Essential (primary) hypertension: Secondary | ICD-10-CM

## 2023-10-29 DIAGNOSIS — E119 Type 2 diabetes mellitus without complications: Secondary | ICD-10-CM

## 2023-10-29 NOTE — Telephone Encounter (Signed)
 Patient notified via mychart

## 2023-10-29 NOTE — Telephone Encounter (Signed)
 Campbell Riches, NP     Labs ordered this morning by Dr. Lorin Picket

## 2023-10-30 ENCOUNTER — Encounter: Payer: Self-pay | Admitting: Family Medicine

## 2023-10-30 LAB — LIPID PANEL
Chol/HDL Ratio: 2.9 ratio (ref 0.0–4.4)
Cholesterol, Total: 134 mg/dL (ref 100–199)
HDL: 47 mg/dL (ref >39)
LDL Chol Calc (NIH): 62 mg/dL (ref 0–99)
Triglycerides: 146 mg/dL (ref 0–149)
VLDL Cholesterol Cal: 25 mg/dL (ref 5–40)

## 2023-10-30 LAB — BASIC METABOLIC PANEL
BUN/Creatinine Ratio: 15 (ref 12–28)
BUN: 9 mg/dL (ref 8–27)
CO2: 22 mmol/L (ref 20–29)
Calcium: 9.5 mg/dL (ref 8.7–10.3)
Chloride: 106 mmol/L (ref 96–106)
Creatinine, Ser: 0.62 mg/dL (ref 0.57–1.00)
Glucose: 92 mg/dL (ref 70–99)
Potassium: 4.7 mmol/L (ref 3.5–5.2)
Sodium: 143 mmol/L (ref 134–144)
eGFR: 102 mL/min/{1.73_m2} (ref >59)

## 2023-10-30 LAB — HEMOGLOBIN A1C
Est. average glucose Bld gHb Est-mCnc: 123 mg/dL
Hgb A1c MFr Bld: 5.9 % — ABNORMAL HIGH (ref 4.8–5.6)

## 2023-10-30 LAB — HEPATIC FUNCTION PANEL
ALT: 11 IU/L (ref 0–32)
AST: 18 IU/L (ref 0–40)
Albumin: 4.3 g/dL (ref 3.8–4.9)
Alkaline Phosphatase: 73 IU/L (ref 44–121)
Bilirubin Total: 0.3 mg/dL (ref 0.0–1.2)
Bilirubin, Direct: 0.12 mg/dL (ref 0.00–0.40)
Total Protein: 7.1 g/dL (ref 6.0–8.5)

## 2023-11-01 ENCOUNTER — Ambulatory Visit: Payer: 59 | Admitting: Nurse Practitioner

## 2023-11-01 ENCOUNTER — Encounter: Payer: Self-pay | Admitting: Nurse Practitioner

## 2023-11-01 VITALS — BP 130/82 | HR 77 | Temp 98.1°F | Ht 66.0 in | Wt 226.4 lb

## 2023-11-01 DIAGNOSIS — E119 Type 2 diabetes mellitus without complications: Secondary | ICD-10-CM

## 2023-11-01 DIAGNOSIS — Z23 Encounter for immunization: Secondary | ICD-10-CM

## 2023-11-01 DIAGNOSIS — Z794 Long term (current) use of insulin: Secondary | ICD-10-CM | POA: Diagnosis not present

## 2023-11-01 DIAGNOSIS — G4733 Obstructive sleep apnea (adult) (pediatric): Secondary | ICD-10-CM | POA: Diagnosis not present

## 2023-11-01 DIAGNOSIS — I10 Essential (primary) hypertension: Secondary | ICD-10-CM | POA: Diagnosis not present

## 2023-11-01 DIAGNOSIS — E1169 Type 2 diabetes mellitus with other specified complication: Secondary | ICD-10-CM | POA: Diagnosis not present

## 2023-11-01 NOTE — Progress Notes (Signed)
 Subjective:    Patient ID: Susan Becker, female    DOB: 1960/11/12, 63 y.o.   MRN: [id number]  HPI Susan Becker presents today for a follow-up on her diabetes and to review labs. Adherent to medication regimen. She is currently on 26 units of Lantus. Has rare hypoglycemic episodes. No syncope. Has tried to cut back on sugars in diet. Has been walking frequently for exercise. Checks blood sugars frequently, and says they run about 100 in the mornings, which is an improvement for her. Is up to date on eye exam. Just had cataract surgery 2-3 weeks ago. Has a blood pressure monitor at home, but does not check it frequently. Wears CPAP every night, and reports her sleep is good.    Review of Systems  Constitutional:  Negative for activity change, appetite change and unexpected weight change.  Respiratory:  Negative for cough, shortness of breath and wheezing.   Cardiovascular:  Negative for chest pain.  Gastrointestinal:  Negative for constipation, diarrhea, nausea and vomiting.  Psychiatric/Behavioral:  Negative for sleep disturbance.       Objective:   Physical Exam Vitals and nursing note reviewed.  Constitutional:      General: She is not in acute distress.    Appearance: She is not ill-appearing.  Cardiovascular:     Rate and Rhythm: Normal rate and regular rhythm.     Heart sounds: Normal heart sounds, S1 normal and S2 normal. No murmur heard. Pulmonary:     Effort: Pulmonary effort is normal. No respiratory distress.     Breath sounds: Normal breath sounds. No wheezing.  Neurological:     Mental Status: She is alert.  Psychiatric:        Mood and Affect: Mood normal.        Behavior: Behavior normal.        Thought Content: Thought content normal.        Judgment: Judgment normal.   Vitals:   11/01/23 0815 11/01/23 0822  BP: (!) 141/86 130/82  Pulse: 77   Temp: 98.1 F (36.7 C)   Height: 5\' 6"  (1.676 m)   Weight: 226 lb 6.4 oz (102.7 kg)   SpO2: 98%   BMI (Calculated):  36.56     Recent Results (from the past 2160 hours)  CBG monitoring, ED     Status: None   Collection Time: 10/27/23  1:46 PM  Result Value Ref Range   Glucose-Capillary 78 70 - 99 mg/dL    Comment: Glucose reference range applies only to samples taken after fasting for at least 8 hours.  Lipid Panel     Status: None   Collection Time: 10/29/23  8:43 AM  Result Value Ref Range   Cholesterol, Total 134 100 - 199 mg/dL   Triglycerides 782 0 - 149 mg/dL   HDL 47 >95 mg/dL   VLDL Cholesterol Cal 25 5 - 40 mg/dL   LDL Chol Calc (NIH) 62 0 - 99 mg/dL   Chol/HDL Ratio 2.9 0.0 - 4.4 ratio    Comment:                                   T. Chol/HDL Ratio  Men  Women                               1/2 Avg.Risk  3.4    3.3                                   Avg.Risk  5.0    4.4                                2X Avg.Risk  9.6    7.1                                3X Avg.Risk 23.4   11.0   Hepatic Function Panel     Status: None   Collection Time: 10/29/23  8:43 AM  Result Value Ref Range   Total Protein 7.1 6.0 - 8.5 g/dL   Albumin 4.3 3.8 - 4.9 g/dL   Bilirubin Total 0.3 0.0 - 1.2 mg/dL   Bilirubin, Direct 5.40 0.00 - 0.40 mg/dL   Alkaline Phosphatase 73 44 - 121 IU/L   AST 18 0 - 40 IU/L   ALT 11 0 - 32 IU/L  Basic Metabolic Panel     Status: None   Collection Time: 10/29/23  8:43 AM  Result Value Ref Range   Glucose 92 70 - 99 mg/dL   BUN 9 8 - 27 mg/dL   Creatinine, Ser 9.81 0.57 - 1.00 mg/dL   eGFR 191 >47 WG/NFA/2.13   BUN/Creatinine Ratio 15 12 - 28   Sodium 143 134 - 144 mmol/L   Potassium 4.7 3.5 - 5.2 mmol/L   Chloride 106 96 - 106 mmol/L   CO2 22 20 - 29 mmol/L   Calcium 9.5 8.7 - 10.3 mg/dL  Hemoglobin Y8M     Status: Abnormal   Collection Time: 10/29/23  8:43 AM  Result Value Ref Range   Hgb A1c MFr Bld 5.9 (H) 4.8 - 5.6 %    Comment:          Prediabetes: 5.7 - 6.4          Diabetes: >6.4          Glycemic control for  adults with diabetes: <7.0    Est. average glucose Bld gHb Est-mCnc 123 mg/dL       5/78/4696    2:95 AM 07/31/2023    8:54 AM 05/24/2023    9:56 AM 04/27/2023    8:42 AM 01/25/2023    9:03 AM  Depression screen PHQ 2/9  Decreased Interest 0 0 0 0 0  Down, Depressed, Hopeless 0 0 0 0 0  PHQ - 2 Score 0 0 0 0 0  Altered sleeping 0 0 0 0 0  Tired, decreased energy 0 1 1 1  0  Change in appetite 0 0 0 0 0  Feeling bad or failure about yourself  0 0 0 0 0  Trouble concentrating 0 0 0 0 0  Moving slowly or fidgety/restless 0 0 0 0 0  Suicidal thoughts 0 0 0 0 0  PHQ-9 Score 0 1 1 1  0  Difficult doing work/chores Not difficult at all Not difficult at all Not difficult at all Not difficult at all  11/01/2023    8:21 AM 07/31/2023    8:54 AM 05/24/2023    9:57 AM 04/27/2023    8:43 AM  GAD 7 : Generalized Anxiety Score  Nervous, Anxious, on Edge 0 0 0 0  Control/stop worrying 0 0 0 0  Worry too much - different things 0 0 1 0  Trouble relaxing 0 0 0 0  Restless 0 0 0 0  Easily annoyed or irritable 0 0 0 0  Afraid - awful might happen 0 0 0 0  Total GAD 7 Score 0 0 1 0  Anxiety Difficulty Not difficult at all Not difficult at all Not difficult at all Not difficult at all      Assessment & Plan:  1. Type 2 diabetes mellitus without complication, with long-term current use of insulin (HCC) (Primary) -Discussed labs with patient. Patient asked if she could come down on the medication. Advised her to continue with her current medication regimen due to her A1C being at goal. Educated patient about symptoms and treatment of hypoglycemia, and to let us know if she is having frequent hypoglycemic episodes.   2. Essential hypertension, benign -Advised patient to check blood pressure at home at least once a week. As her health improves, may need to slowly decrease BP med.   3. Obstructive sleep apnea treated with continuous positive airway pressure (CPAP) -Advised patient to continue to  wear CPAP.   4. Immunization due - Pneumococcal conjugate vaccine 20-valent (Prevnar 20)   Return in about 4 months (around 03/02/2024).   I have seen and examined this patient alongside the NP student. I have reviewed and verified the student note and agree with the assessment and plan.  Sherie Don, FNP

## 2023-11-04 ENCOUNTER — Other Ambulatory Visit: Payer: Self-pay | Admitting: Family Medicine

## 2023-11-05 ENCOUNTER — Other Ambulatory Visit: Payer: Self-pay

## 2023-11-05 MED ORDER — TIRZEPATIDE 10 MG/0.5ML ~~LOC~~ SOAJ: 10.0000 mg | SUBCUTANEOUS | 3 refills | Status: DC

## 2023-11-16 ENCOUNTER — Other Ambulatory Visit: Payer: Self-pay | Admitting: Family Medicine

## 2023-12-28 ENCOUNTER — Other Ambulatory Visit: Payer: Self-pay | Admitting: Family Medicine

## 2024-01-30 ENCOUNTER — Telehealth: Payer: Self-pay | Admitting: Family Medicine

## 2024-01-30 MED ORDER — PANTOPRAZOLE SODIUM 40 MG PO TBEC
DELAYED_RELEASE_TABLET | ORAL | 0 refills | Status: DC
Start: 2024-01-30 — End: 2024-04-30

## 2024-01-30 NOTE — Telephone Encounter (Signed)
 Refill on pantoprazole  (PROTONIX ) 40 MG tablet , lantus  solostar pen inj 3ml [facility]

## 2024-02-08 ENCOUNTER — Other Ambulatory Visit: Payer: Self-pay | Admitting: Family Medicine

## 2024-02-08 MED ORDER — LANTUS SOLOSTAR 100 UNIT/ML ~~LOC~~ SOPN: PEN_INJECTOR | SUBCUTANEOUS | 1 refills | Status: DC

## 2024-02-08 NOTE — Telephone Encounter (Signed)
 Copied from CRM [id number]. Topic: Clinical - Medication Refill >> Feb 08, 2024 11:58 AM Carlatta H wrote: Medication: insulin glargine  (LANTUS  SOLOSTAR) 100 UNIT/ML Solostar Pen [[id number]]  Has the patient contacted their pharmacy? No (Agent: If no, request that the patient contact the pharmacy for the refill. If patient does not wish to contact the pharmacy document the reason why and proceed with request.) (Agent: If yes, when and what did the pharmacy advise?) Patient has been without medication for 1 week  This is the patient's preferred pharmacy:  [id number] - [place], [place] - [address] [place] [address] Phone: [phone] Fax: [phone]    Is this the correct pharmacy for this prescription? Yes If no, delete pharmacy and type the correct one.   Has the prescription been filled recently? No  Is the patient out of the medication? Yes  Has the patient been seen for an appointment in the last year OR does the patient have an upcoming appointment? Yes  Can we respond through MyChart? No  Agent: Please be advised that Rx refills may take up to 3 business days. We ask that you follow-up with your pharmacy.

## 2024-02-14 ENCOUNTER — Institutional Professional Consult (permissible substitution) (INDEPENDENT_AMBULATORY_CARE_PROVIDER_SITE_OTHER): Admitting: Otolaryngology

## 2024-02-19 ENCOUNTER — Other Ambulatory Visit: Payer: Self-pay | Admitting: Family Medicine

## 2024-02-21 ENCOUNTER — Other Ambulatory Visit: Payer: Self-pay | Admitting: Family Medicine

## 2024-02-24 DIAGNOSIS — G4733 Obstructive sleep apnea (adult) (pediatric): Secondary | ICD-10-CM | POA: Diagnosis not present

## 2024-02-26 ENCOUNTER — Encounter: Payer: Self-pay | Admitting: Nurse Practitioner

## 2024-03-06 ENCOUNTER — Encounter: Payer: Self-pay | Admitting: Nurse Practitioner

## 2024-03-06 ENCOUNTER — Ambulatory Visit: Admitting: Nurse Practitioner

## 2024-03-06 VITALS — BP 124/81 | HR 71 | Temp 97.3°F | Ht 66.0 in | Wt 219.8 lb

## 2024-03-06 DIAGNOSIS — F419 Anxiety disorder, unspecified: Secondary | ICD-10-CM | POA: Diagnosis not present

## 2024-03-06 DIAGNOSIS — E785 Hyperlipidemia, unspecified: Secondary | ICD-10-CM

## 2024-03-06 DIAGNOSIS — Z794 Long term (current) use of insulin: Secondary | ICD-10-CM

## 2024-03-06 DIAGNOSIS — E1169 Type 2 diabetes mellitus with other specified complication: Secondary | ICD-10-CM

## 2024-03-06 DIAGNOSIS — B3731 Acute candidiasis of vulva and vagina: Secondary | ICD-10-CM

## 2024-03-06 DIAGNOSIS — E119 Type 2 diabetes mellitus without complications: Secondary | ICD-10-CM

## 2024-03-06 DIAGNOSIS — I10 Essential (primary) hypertension: Secondary | ICD-10-CM

## 2024-03-06 MED ORDER — TERCONAZOLE 0.4 % VA CREA: 1.0000 | TOPICAL_CREAM | Freq: Every day | VAGINAL | 0 refills | Status: AC

## 2024-03-06 NOTE — Progress Notes (Signed)
 Subjective:    Patient ID: Susan Becker, female    DOB: [birth year], 63 y.o.   MRN: [id number]  HPI Presents for routine follow-up of her chronic health issues.  Adherent to medication regimen.  Has not checked her blood pressure at home but does have a monitor.  Blood sugars have been running well 90-100, occasional low blood sugar.  Doing well with her diet.  Walking for activity.  Has noticed some mild lightheadedness with position change sometimes with head movement over the past month.  No syncopal episodes.  Also has persistent yeast infection that has not cleared up with use of oral Diflucan . Gets regular eye exams, has had cataract removal.  Also is scheduled to have inspire procedure for her sleep apnea.  Review of Systems  HENT:  Negative for congestion, ear pain, sinus pressure, sinus pain and sore throat.   Respiratory:  Negative for cough, chest tightness and shortness of breath.   Cardiovascular:  Negative for chest pain and leg swelling.  Gastrointestinal:        GERD stable  Neurological:  Positive for light-headedness. Negative for syncope, facial asymmetry, weakness, numbness and headaches.      03/06/2024    8:26 AM  Depression screen PHQ 2/9  Decreased Interest 0  Down, Depressed, Hopeless 0  PHQ - 2 Score 0  Altered sleeping 0  Tired, decreased energy 1  Change in appetite 0  Feeling bad or failure about yourself  0  Trouble concentrating 0  Moving slowly or fidgety/restless 0  Suicidal thoughts 0  PHQ-9 Score 1  Difficult doing work/chores Not difficult at all      03/06/2024    8:27 AM 11/01/2023    8:21 AM 07/31/2023    8:54 AM 05/24/2023    9:57 AM  GAD 7 : Generalized Anxiety Score  Nervous, Anxious, on Edge 0 0 0 0  Control/stop worrying 0 0 0 0  Worry too much - different things 0 0 0 1  Trouble relaxing 0 0 0 0  Restless 0 0 0 0  Easily annoyed or irritable 0 0 0 0  Afraid - awful might happen 0 0 0 0  Total GAD 7 Score 0 0 0 1  Anxiety  Difficulty  Not difficult at all Not difficult at all Not difficult at all         Objective:   Physical Exam Vitals and nursing note reviewed.  Constitutional:      General: She is not in acute distress. HENT:     Right Ear: Tympanic membrane normal.     Left Ear: Tympanic membrane normal.     Mouth/Throat:     Mouth: Mucous membranes are moist.     Pharynx: Oropharynx is clear.  Cardiovascular:     Rate and Rhythm: Normal rate and regular rhythm.     Heart sounds: Normal heart sounds. No murmur heard. Pulmonary:     Effort: Pulmonary effort is normal.     Breath sounds: Normal breath sounds.  Neurological:     Mental Status: She is alert and oriented to person, place, and time.     Gait: Gait normal.  Psychiatric:        Mood and Affect: Mood normal.        Behavior: Behavior normal.        Thought Content: Thought content normal.        Judgment: Judgment normal.    Today's Vitals   03/06/24 9176  BP: 124/81  Pulse: 71  Temp: (!) 97.3 F (36.3 C)  SpO2: 98%  Weight: 219 lb 12.8 oz (99.7 kg)  Height: 5' 6 (1.676 m)   Body mass index is 35.48 kg/m.         Assessment & Plan:   Problem List Items Addressed This Visit       Cardiovascular and Mediastinum   Essential hypertension, benign     Endocrine   Hyperlipidemia associated with type 2 diabetes mellitus (HCC)   Type 2 diabetes mellitus (HCC) - Primary     Genitourinary   Vaginal candidiasis     Other   Anxiety   Meds ordered this encounter  Medications   terconazole  (TERAZOL 7 ) 0.4 % vaginal cream    Sig: Place 1 applicator vaginally at bedtime. X 7 days    Dispense:  45 g    Refill:  0    Supervising Provider:   ALPHONSA HAMILTON A [[id number]]   Used terconazole  vaginal cream as directed over the next week.  If vaginal yeast infection is still not resolved, use boric acid vaginally as directed.  If symptoms still persist, consider trial of stopping Farxiga  to see if this will help. With weight  loss, improvement in sugars and healthy lifestyle changes, recommend patient monitor her blood pressure and sugars.  May need to cut her losartan  in half for a 50 mg dose.  Also may need to start slowly decreasing her insulin by 2 units at a time if she is experiencing hypoglycemia.  Patient verbalizes understanding. Return in about 4 months (around 07/07/2024). Recommend preventive health physical this fall.

## 2024-03-06 NOTE — Patient Instructions (Signed)
 Boric acid Apply vaginally once a day at bedtime for 7 days

## 2024-03-13 DIAGNOSIS — E1142 Type 2 diabetes mellitus with diabetic polyneuropathy: Secondary | ICD-10-CM | POA: Diagnosis not present

## 2024-03-13 DIAGNOSIS — L853 Xerosis cutis: Secondary | ICD-10-CM | POA: Diagnosis not present

## 2024-03-13 DIAGNOSIS — L84 Corns and callosities: Secondary | ICD-10-CM | POA: Diagnosis not present

## 2024-03-28 ENCOUNTER — Other Ambulatory Visit (HOSPITAL_COMMUNITY): Payer: Self-pay | Admitting: Family Medicine

## 2024-03-28 DIAGNOSIS — Z1231 Encounter for screening mammogram for malignant neoplasm of breast: Secondary | ICD-10-CM

## 2024-03-31 ENCOUNTER — Telehealth: Payer: Self-pay

## 2024-03-31 ENCOUNTER — Other Ambulatory Visit: Payer: Self-pay

## 2024-03-31 MED ORDER — ONETOUCH ULTRASOFT LANCETS MISC: 12 refills | Status: AC

## 2024-03-31 MED ORDER — ROSUVASTATIN CALCIUM 20 MG PO TABS: 20.0000 mg | ORAL_TABLET | Freq: Every day | ORAL | 1 refills | Status: DC

## 2024-03-31 NOTE — Telephone Encounter (Signed)
 Prescription Request  03/31/2024  LOV: Visit date not found  What is the name of the medication or equipment? One touch Delica Plus 33G Lancets   Have you contacted your pharmacy to request a refill? Yes   Which pharmacy would you like this sent to?   [facility] [address]     Patient notified that their request is being sent to the clinical staff for review and that they should receive a response within 2 business days.   Please advise at Mobile [phone] (mobile)

## 2024-03-31 NOTE — Telephone Encounter (Signed)
 Prescription Request  03/31/2024  LOV: Visit date not found  What is the name of the medication or equipment? rosuvastatin  (CRESTOR ) 20 MG tablet   Have you contacted your pharmacy to request a refill? Yes   Which pharmacy would you like this sent to?   [facility] [address]       Patient notified that their request is being sent to the clinical staff for review and that they should receive a response within 2 business days.   Please advise at Mobile [phone] (mobile)

## 2024-04-02 ENCOUNTER — Encounter: Payer: Self-pay | Admitting: Nurse Practitioner

## 2024-04-03 ENCOUNTER — Other Ambulatory Visit: Payer: Self-pay | Admitting: Nurse Practitioner

## 2024-04-03 MED ORDER — TRESIBA FLEXTOUCH 100 UNIT/ML ~~LOC~~ SOPN: 20.0000 [IU] | PEN_INJECTOR | Freq: Every day | SUBCUTANEOUS | 0 refills | Status: DC

## 2024-04-04 DIAGNOSIS — G4733 Obstructive sleep apnea (adult) (pediatric): Secondary | ICD-10-CM | POA: Diagnosis not present

## 2024-04-28 ENCOUNTER — Telehealth: Payer: Self-pay | Admitting: Family Medicine

## 2024-04-28 MED ORDER — DAPAGLIFLOZIN PROPANEDIOL 10 MG PO TABS: 10.0000 mg | ORAL_TABLET | Freq: Every day | ORAL | 1 refills | Status: AC

## 2024-04-28 NOTE — Telephone Encounter (Signed)
 Farxiga  refill sent to [facility]

## 2024-04-28 NOTE — Telephone Encounter (Signed)
 Requesting refill on dapagliflozin  propanediol (FARXIGA ) 10 MG TABS tablet   [facility]

## 2024-04-30 ENCOUNTER — Other Ambulatory Visit: Payer: Self-pay | Admitting: Family Medicine

## 2024-05-08 ENCOUNTER — Other Ambulatory Visit: Payer: Self-pay | Admitting: Family Medicine

## 2024-05-14 ENCOUNTER — Telehealth: Payer: Self-pay | Admitting: Nurse Practitioner

## 2024-05-14 ENCOUNTER — Other Ambulatory Visit: Payer: Self-pay

## 2024-05-14 DIAGNOSIS — E119 Type 2 diabetes mellitus without complications: Secondary | ICD-10-CM

## 2024-05-14 MED ORDER — TRESIBA FLEXTOUCH 100 UNIT/ML ~~LOC~~ SOPN: 20.0000 [IU] | PEN_INJECTOR | Freq: Every day | SUBCUTANEOUS | 0 refills | Status: DC

## 2024-05-14 NOTE — Telephone Encounter (Signed)
 Refill on    insulin degludec  (TRESIBA  FLEXTOUCH) 100 UNIT/ML FlexTouch Pen   [facility]

## 2024-05-15 DIAGNOSIS — G4733 Obstructive sleep apnea (adult) (pediatric): Secondary | ICD-10-CM | POA: Diagnosis not present

## 2024-05-15 NOTE — Telephone Encounter (Signed)
 Sent in on 05/14/24 by Dr Alphonsa.

## 2024-05-22 ENCOUNTER — Other Ambulatory Visit: Payer: Self-pay | Admitting: Nurse Practitioner

## 2024-05-22 ENCOUNTER — Encounter: Payer: Self-pay | Admitting: Nurse Practitioner

## 2024-05-22 DIAGNOSIS — E119 Type 2 diabetes mellitus without complications: Secondary | ICD-10-CM

## 2024-05-23 DIAGNOSIS — Z6833 Body mass index (BMI) 33.0-33.9, adult: Secondary | ICD-10-CM | POA: Diagnosis not present

## 2024-05-23 DIAGNOSIS — G4733 Obstructive sleep apnea (adult) (pediatric): Secondary | ICD-10-CM | POA: Diagnosis not present

## 2024-05-24 ENCOUNTER — Other Ambulatory Visit: Payer: Self-pay | Admitting: Family Medicine

## 2024-05-27 DIAGNOSIS — H43811 Vitreous degeneration, right eye: Secondary | ICD-10-CM | POA: Diagnosis not present

## 2024-05-27 DIAGNOSIS — E119 Type 2 diabetes mellitus without complications: Secondary | ICD-10-CM | POA: Diagnosis not present

## 2024-05-27 LAB — OPHTHALMOLOGY REPORT-SCANNED

## 2024-05-28 LAB — HEMOGLOBIN A1C
Est. average glucose Bld gHb Est-mCnc: 117 mg/dL
Hgb A1c MFr Bld: 5.7 % — ABNORMAL HIGH (ref 4.8–5.6)

## 2024-05-29 DIAGNOSIS — G4733 Obstructive sleep apnea (adult) (pediatric): Secondary | ICD-10-CM | POA: Diagnosis not present

## 2024-05-30 ENCOUNTER — Ambulatory Visit (INDEPENDENT_AMBULATORY_CARE_PROVIDER_SITE_OTHER): Admitting: Nurse Practitioner

## 2024-05-30 ENCOUNTER — Encounter: Payer: Self-pay | Admitting: Nurse Practitioner

## 2024-05-30 VITALS — BP 127/80 | HR 67 | Temp 97.8°F | Ht 66.0 in | Wt 211.0 lb

## 2024-05-30 DIAGNOSIS — M40204 Unspecified kyphosis, thoracic region: Secondary | ICD-10-CM

## 2024-05-30 DIAGNOSIS — Z01411 Encounter for gynecological examination (general) (routine) with abnormal findings: Secondary | ICD-10-CM

## 2024-05-30 DIAGNOSIS — F419 Anxiety disorder, unspecified: Secondary | ICD-10-CM

## 2024-05-30 DIAGNOSIS — Z01419 Encounter for gynecological examination (general) (routine) without abnormal findings: Secondary | ICD-10-CM

## 2024-05-30 DIAGNOSIS — Z13820 Encounter for screening for osteoporosis: Secondary | ICD-10-CM | POA: Diagnosis not present

## 2024-05-30 DIAGNOSIS — E119 Type 2 diabetes mellitus without complications: Secondary | ICD-10-CM

## 2024-05-30 MED ORDER — ESCITALOPRAM OXALATE 20 MG PO TABS: ORAL_TABLET | ORAL | 1 refills | Status: AC

## 2024-05-30 NOTE — Progress Notes (Signed)
 Subjective:    Patient ID: Susan Becker, female    DOB: [birth year], 63 y.o.   MRN: [id number]  CC: Well woman Exam  HPI 63 year old female arrived for her annual physical. Her only concern was constipation. She stated that it started recently and has started to take olive oil which has helped minimal. She went from having a bowel movement daily to going every 2 or 3 days. She participates in walking the treadmill 3 days a week at work. Her eye exam is scheduled for 05/2024 and her dental exam was in 02/2024. She will receive her flu vaccine next week at work. She has a mammogram scheduled for this month, 05/2024 and her colonoscopy was completed in 2021. She continues to have the same monogamous partner and denies any concern for STIs. She has maintained control over her recurrent vaginal candidiasis by switching soap and has not had one in weeks. She reports needing a refill on her Zoloft which is working well.  In addition, she arrived with recent surgical incisions from her Inspire OSA insertion on her right upper neck and chest.     Review of Systems  Constitutional:  Negative for activity change, appetite change, fatigue and fever.  HENT:  Negative for sore throat and trouble swallowing.   Respiratory:  Negative for cough, chest tightness, shortness of breath and wheezing.   Cardiovascular:  Negative for chest pain.  Gastrointestinal:  Positive for constipation. Negative for abdominal distention, abdominal pain, blood in stool, diarrhea, nausea and vomiting.  Genitourinary:  Negative for difficulty urinating, dysuria, enuresis, frequency, genital sores, pelvic pain, urgency, vaginal bleeding, vaginal discharge and vaginal pain.       Denies vulvar rash, lesions, itching or irritation.       05/30/2024    8:38 AM 03/06/2024    8:27 AM 11/01/2023    8:21 AM 07/31/2023    8:54 AM  GAD 7 : Generalized Anxiety Score  Nervous, Anxious, on Edge 0 0 0 0  Control/stop worrying 0 0 0 0   Worry too much - different things 0 0 0 0  Trouble relaxing 0 0 0 0  Restless 0 0 0 0  Easily annoyed or irritable 0 0 0 0  Afraid - awful might happen 0 0 0 0  Total GAD 7 Score 0 0 0 0  Anxiety Difficulty Not difficult at all  Not difficult at all Not difficult at all        05/30/2024    8:37 AM 03/06/2024    8:26 AM 11/01/2023    8:20 AM  Depression screen PHQ 2/9  Decreased Interest 0 0 0  Down, Depressed, Hopeless 0 0 0  PHQ - 2 Score 0 0 0  Altered sleeping 0 0 0  Tired, decreased energy 0 1 0  Change in appetite 0 0 0  Feeling bad or failure about yourself  0 0 0  Trouble concentrating 0 0 0  Moving slowly or fidgety/restless 0 0 0  Suicidal thoughts 0 0 0  PHQ-9 Score 0 1 0  Difficult doing work/chores Not difficult at all Not difficult at all Not difficult at all       Social History   Socioeconomic History   Marital status: Married    Spouse name: Not on file   Number of children: Not on file   Years of education: Not on file   Highest education level: Associate degree: occupational, Scientist, product/process development, or vocational program  Occupational History  Not on file  Tobacco Use   Smoking status: Never   Smokeless tobacco: Never  Vaping Use   Vaping status: Never Used  Substance and Sexual Activity   Alcohol use: Never    Alcohol/week: 0.0 standard drinks of alcohol   Drug use: Never   Sexual activity: Yes    Birth control/protection: Post-menopausal  Other Topics Concern   Not on file  Social History Narrative   Not on file   Social Drivers of Health   Financial Resource Strain: Low Risk  (05/29/2024)   Overall Financial Resource Strain (CARDIA)    Difficulty of Paying Living Expenses: Not hard at all  Food Insecurity: No Food Insecurity (05/29/2024)   Hunger Vital Sign    Worried About Running Out of Food in the Last Year: Never true    Ran Out of Food in the Last Year: Never true  Transportation Needs: No Transportation Needs (05/29/2024)   PRAPARE -  Administrator, Civil Service (Medical): No    Lack of Transportation (Non-Medical): No  Physical Activity: Insufficiently Active (05/29/2024)   Exercise Vital Sign    Days of Exercise per Week: 3 days    Minutes of Exercise per Session: 20 min  Stress: No Stress Concern Present (05/29/2024)   [employer] of Occupational Health - Occupational Stress Questionnaire    Feeling of Stress: Not at all  Social Connections: Socially Integrated (05/29/2024)   Social Connection and Isolation Panel    Frequency of Communication with Friends and Family: Three times a week    Frequency of Social Gatherings with Friends and Family: Three times a week    Attends Religious Services: 1 to 4 times per year    Active Member of Clubs or Organizations: Yes    Attends Banker Meetings: 1 to 4 times per year    Marital Status: Married  Catering manager Violence: Not At Risk (11/30/2022)   Humiliation, Afraid, Rape, and Kick questionnaire    Fear of Current or Ex-Partner: No    Emotionally Abused: No    Physically Abused: No    Sexually Abused: No    Objective:   Physical Exam Vitals and nursing note reviewed. Exam conducted with a chaperone present.  Constitutional:      General: She is not in acute distress.    Appearance: Normal appearance.  Neck:     Comments: Thyroid soft and nontender. No mass noted. Cardiovascular:     Rate and Rhythm: Normal rate and regular rhythm.     Heart sounds: Normal heart sounds.  Pulmonary:     Effort: Pulmonary effort is normal.     Breath sounds: Normal breath sounds.  Chest:  Breasts:    Right: Normal. No mass, nipple discharge or tenderness.     Left: Normal. No mass, nipple discharge or tenderness.  Abdominal:     Palpations: Abdomen is soft.     Tenderness: There is no abdominal tenderness. There is no guarding.  Genitourinary:    Comments: Defers GU exam.  Musculoskeletal:     Cervical back: No tenderness.  Skin:     General: Skin is warm and dry.  Neurological:     Mental Status: She is alert and oriented to person, place, and time.  Psychiatric:        Mood and Affect: Mood normal.        Behavior: Behavior normal.        Thought Content: Thought content normal.  Diabetic Foot Exam - Simple   Simple Foot Form Diabetic Foot exam was performed with the following findings: Yes 05/30/2024  8:45 AM  Visual Inspection No deformities, no ulcerations, no other skin breakdown bilaterally: Yes Sensation Testing Intact to touch and monofilament testing bilaterally: Yes Pulse Check Posterior Tibialis and Dorsalis pulse intact bilaterally: Yes Comments     05/27/24: Hgb A1c 5.7.      Vitals:   05/30/24 0834  BP: 127/80  Pulse: 67  Temp: 97.8 F (36.6 C)  Height: 5' 6 (1.676 m)  Weight: 95.7 kg  SpO2: 99%  BMI (Calculated): 34.07        Assessment & Plan:   1. Well woman exam (Primary) Constipation - Patient educated on increasing magnesium in diet and increasing fluids. Patient instructed to include fiber in her diet and utilize OTC miralax as directed, if needed. If there is not any relief or further concerns, contact the office.   Immunizations needs and screening requirements discussed. Will get flu vaccine outside of the office.   2. Diabetes mellitus without complication (HCC) 05/27/2024 A1C was 5.7 Continue to eat a healthy diet and maintain regular exercise and weight loss.  - Microalbumin / creatinine urine ratio  3. Screening for osteoporosis - DG Bone Density  4. Kyphosis of thoracic region, unspecified kyphosis type - DG Bone Density  5. Anxiety Continue to maintain healthy coping measures.  - escitalopram  (LEXAPRO ) 20 MG tablet; TAKE 1 TABLET(20 MG) BY MOUTH DAILY  Dispense: 90 tablet; Refill: 1   Return in about 6 months (around 11/28/2024).   I have seen and examined this patient alongside the NP student. I have reviewed and verified the student note and agree  with the assessment and plan.  Elveria Quarry, FNP

## 2024-05-30 NOTE — Progress Notes (Signed)
   Subjective:    Patient ID: Susan Becker, female    DOB: May 01, 1961, 63 y.o.   MRN: [id number]  HPI  The patient comes in today for a wellness visit.    A review of their health history was completed.  A review of medications was also completed.  Any needed refills; escitalopram   Eating habits: good  Falls/  MVA accidents in past few months: no  Regular exercise: walks  Specialist pt sees on regular basis:   Preventative health issues were discussed.   Additional concerns:    Review of Systems     Objective:   Physical Exam        Assessment & Plan:

## 2024-06-01 ENCOUNTER — Ambulatory Visit: Payer: Self-pay | Admitting: Nurse Practitioner

## 2024-06-01 ENCOUNTER — Encounter: Payer: Self-pay | Admitting: Nurse Practitioner

## 2024-06-01 LAB — MICROALBUMIN / CREATININE URINE RATIO
Creatinine, Urine: 84 mg/dL
Microalb/Creat Ratio: 6 mg/g{creat} (ref 0–29)
Microalbumin, Urine: 4.7 ug/mL

## 2024-06-19 ENCOUNTER — Ambulatory Visit (HOSPITAL_COMMUNITY)
Admission: RE | Admit: 2024-06-19 | Discharge: 2024-06-19 | Disposition: A | Discharge status: Home / Self Care | Source: Ambulatory Visit | Attending: Family Medicine | Admitting: Family Medicine

## 2024-06-19 ENCOUNTER — Ambulatory Visit (HOSPITAL_COMMUNITY)
Admission: RE | Admit: 2024-06-19 | Discharge: 2024-06-19 | Disposition: A | Discharge status: Home / Self Care | Source: Ambulatory Visit | Attending: Nurse Practitioner | Admitting: Nurse Practitioner

## 2024-06-19 DIAGNOSIS — Z13820 Encounter for screening for osteoporosis: Secondary | ICD-10-CM | POA: Insufficient documentation

## 2024-06-19 DIAGNOSIS — M40204 Unspecified kyphosis, thoracic region: Secondary | ICD-10-CM | POA: Insufficient documentation

## 2024-06-19 DIAGNOSIS — Z1231 Encounter for screening mammogram for malignant neoplasm of breast: Secondary | ICD-10-CM | POA: Insufficient documentation

## 2024-06-20 ENCOUNTER — Other Ambulatory Visit: Payer: Self-pay | Admitting: Nurse Practitioner

## 2024-06-20 ENCOUNTER — Encounter: Payer: Self-pay | Admitting: Nurse Practitioner

## 2024-06-20 MED ORDER — MOUNJARO 10 MG/0.5ML ~~LOC~~ SOAJ: SUBCUTANEOUS | 5 refills | Status: AC

## 2024-06-26 ENCOUNTER — Other Ambulatory Visit: Payer: Self-pay | Admitting: Family Medicine

## 2024-06-26 DIAGNOSIS — G4733 Obstructive sleep apnea (adult) (pediatric): Secondary | ICD-10-CM | POA: Diagnosis not present

## 2024-06-26 DIAGNOSIS — Z4542 Encounter for adjustment and management of neuropacemaker (brain) (peripheral nerve) (spinal cord): Secondary | ICD-10-CM | POA: Diagnosis not present

## 2024-06-26 DIAGNOSIS — E119 Type 2 diabetes mellitus without complications: Secondary | ICD-10-CM

## 2024-07-07 ENCOUNTER — Ambulatory Visit: Admitting: Family Medicine

## 2024-07-10 ENCOUNTER — Ambulatory Visit: Admitting: Nurse Practitioner

## 2024-07-10 ENCOUNTER — Other Ambulatory Visit: Payer: Self-pay | Admitting: Family Medicine

## 2024-07-31 DIAGNOSIS — Z4542 Encounter for adjustment and management of neuropacemaker (brain) (peripheral nerve) (spinal cord): Secondary | ICD-10-CM | POA: Diagnosis not present

## 2024-07-31 DIAGNOSIS — G4733 Obstructive sleep apnea (adult) (pediatric): Secondary | ICD-10-CM | POA: Diagnosis not present

## 2024-08-04 ENCOUNTER — Other Ambulatory Visit: Payer: Self-pay | Admitting: Nurse Practitioner

## 2024-08-04 DIAGNOSIS — E119 Type 2 diabetes mellitus without complications: Secondary | ICD-10-CM

## 2024-08-05 ENCOUNTER — Encounter: Payer: Self-pay | Admitting: Family Medicine

## 2024-08-18 ENCOUNTER — Other Ambulatory Visit: Payer: Self-pay | Admitting: Family Medicine

## 2024-08-19 ENCOUNTER — Other Ambulatory Visit: Payer: Self-pay | Admitting: Family Medicine

## 2024-09-11 ENCOUNTER — Other Ambulatory Visit: Payer: Self-pay | Admitting: Nurse Practitioner

## 2024-09-11 ENCOUNTER — Telehealth: Payer: Self-pay | Admitting: *Deleted

## 2024-09-11 MED ORDER — METFORMIN HCL 1000 MG PO TABS: ORAL_TABLET | ORAL | 1 refills | Status: AC

## 2024-09-11 NOTE — Telephone Encounter (Unsigned)
 Copied from CRM [id number]. Topic: Clinical - Medication Refill >> Sep 11, 2024 10:27 AM Gattis SQUIBB wrote: Medication: Metformin  1000   Has the patient contacted their pharmacy? Yes (Agent: If no, request that the patient contact the pharmacy for the refill. If patient does not wish to contact the pharmacy document the reason why and proceed with request.) (Agent: If yes, when and what did the pharmacy advise?)  [facility] [address]  [place]   s this the correct pharmacy for this prescription? Yes If no, delete pharmacy and type the correct one.   Has the prescription been filled recently? Yes  Is the patient out of the medication? Yes  Has the patient been seen for an appointment in the last year OR does the patient have an upcoming appointment? Yes  Can we respond through MyChart? Yes  Agent: Please be advised that Rx refills may take up to 3 business days. We ask that you follow-up with your pharmacy.

## 2024-11-28 ENCOUNTER — Ambulatory Visit: Admitting: Nurse Practitioner
